# Patient Record
Sex: Male | Born: 1965 | Race: White | Hispanic: No | Smoking: Former smoker
Health system: Southern US, Community
[De-identification: ages and names within clinical notes are randomized; demographics above are authoritative.]

## PROBLEM LIST (undated history)

## (undated) DIAGNOSIS — I2510 Atherosclerotic heart disease of native coronary artery without angina pectoris: Secondary | ICD-10-CM

## (undated) DIAGNOSIS — G4733 Obstructive sleep apnea (adult) (pediatric): Secondary | ICD-10-CM

## (undated) DIAGNOSIS — E119 Type 2 diabetes mellitus without complications: Secondary | ICD-10-CM

## (undated) DIAGNOSIS — I10 Essential (primary) hypertension: Secondary | ICD-10-CM

## (undated) HISTORY — DX: Atherosclerotic heart disease of native coronary artery without angina pectoris: I25.10

---

## 2017-05-06 ENCOUNTER — Inpatient Hospital Stay (HOSPITAL_COMMUNITY)
Admission: AD | Admit: 2017-05-06 | Discharge: 2017-05-08 | DRG: 287 | Disposition: A | Discharge status: Home / Self Care | Payer: Self-pay | Source: Other Acute Inpatient Hospital | Attending: Internal Medicine | Admitting: Internal Medicine

## 2017-05-06 ENCOUNTER — Other Ambulatory Visit: Payer: Self-pay

## 2017-05-06 DIAGNOSIS — I200 Unstable angina: Secondary | ICD-10-CM

## 2017-05-06 DIAGNOSIS — E785 Hyperlipidemia, unspecified: Secondary | ICD-10-CM | POA: Diagnosis present

## 2017-05-06 DIAGNOSIS — Z6839 Body mass index (BMI) 39.0-39.9, adult: Secondary | ICD-10-CM

## 2017-05-06 DIAGNOSIS — Z880 Allergy status to penicillin: Secondary | ICD-10-CM

## 2017-05-06 DIAGNOSIS — Z8249 Family history of ischemic heart disease and other diseases of the circulatory system: Secondary | ICD-10-CM

## 2017-05-06 DIAGNOSIS — G4733 Obstructive sleep apnea (adult) (pediatric): Secondary | ICD-10-CM | POA: Diagnosis present

## 2017-05-06 DIAGNOSIS — E119 Type 2 diabetes mellitus without complications: Secondary | ICD-10-CM | POA: Diagnosis present

## 2017-05-06 DIAGNOSIS — Z7984 Long term (current) use of oral hypoglycemic drugs: Secondary | ICD-10-CM

## 2017-05-06 DIAGNOSIS — I25110 Atherosclerotic heart disease of native coronary artery with unstable angina pectoris: Secondary | ICD-10-CM | POA: Diagnosis present

## 2017-05-06 DIAGNOSIS — I10 Essential (primary) hypertension: Principal | ICD-10-CM | POA: Diagnosis present

## 2017-05-06 DIAGNOSIS — I249 Acute ischemic heart disease, unspecified: Secondary | ICD-10-CM | POA: Diagnosis present

## 2017-05-06 HISTORY — DX: Essential (primary) hypertension: I10

## 2017-05-06 HISTORY — DX: Type 2 diabetes mellitus without complications: E11.9

## 2017-05-06 HISTORY — DX: Morbid (severe) obesity due to excess calories: E66.01

## 2017-05-06 HISTORY — DX: Obstructive sleep apnea (adult) (pediatric): G47.33

## 2017-05-06 LAB — CBC WITH DIFFERENTIAL/PLATELET
Basophils Absolute: 0 10*3/uL (ref 0.0–0.1)
Basophils Relative: 0 %
EOS PCT: 1 %
Eosinophils Absolute: 0.1 10*3/uL (ref 0.0–0.7)
HEMATOCRIT: 43.1 % (ref 39.0–52.0)
Hemoglobin: 14.2 g/dL (ref 13.0–17.0)
LYMPHS PCT: 36 %
Lymphs Abs: 2.8 10*3/uL (ref 0.7–4.0)
MCH: 30.3 pg (ref 26.0–34.0)
MCHC: 32.9 g/dL (ref 30.0–36.0)
MCV: 92.1 fL (ref 78.0–100.0)
Monocytes Absolute: 0.4 10*3/uL (ref 0.1–1.0)
Monocytes Relative: 6 %
Neutro Abs: 4.4 10*3/uL (ref 1.7–7.7)
Neutrophils Relative %: 57 %
PLATELETS: 203 10*3/uL (ref 150–400)
RBC: 4.68 MIL/uL (ref 4.22–5.81)
RDW: 13.1 % (ref 11.5–15.5)
WBC: 7.8 10*3/uL (ref 4.0–10.5)

## 2017-05-06 LAB — COMPREHENSIVE METABOLIC PANEL
ALT: 49 U/L (ref 17–63)
AST: 28 U/L (ref 15–41)
Albumin: 3.8 g/dL (ref 3.5–5.0)
Alkaline Phosphatase: 79 U/L (ref 38–126)
Anion gap: 10 (ref 5–15)
BUN: 12 mg/dL (ref 6–20)
CHLORIDE: 101 mmol/L (ref 101–111)
CO2: 24 mmol/L (ref 22–32)
Calcium: 9.1 mg/dL (ref 8.9–10.3)
Creatinine, Ser: 0.71 mg/dL (ref 0.61–1.24)
GFR calc Af Amer: >60 mL/min (ref >60)
GLUCOSE: 229 mg/dL — AB (ref 65–99)
POTASSIUM: 3.9 mmol/L (ref 3.5–5.1)
Sodium: 135 mmol/L (ref 135–145)
Total Bilirubin: 0.8 mg/dL (ref 0.3–1.2)
Total Protein: 7.2 g/dL (ref 6.5–8.1)

## 2017-05-06 LAB — TROPONIN I

## 2017-05-06 LAB — TSH: TSH: 1.778 u[IU]/mL (ref 0.350–4.500)

## 2017-05-06 LAB — HEMOGLOBIN A1C
Hgb A1c MFr Bld: 10 % — ABNORMAL HIGH (ref 4.8–5.6)
MEAN PLASMA GLUCOSE: 240.3 mg/dL

## 2017-05-06 LAB — GLUCOSE, CAPILLARY
GLUCOSE-CAPILLARY: 315 mg/dL — AB (ref 65–99)
Glucose-Capillary: 289 mg/dL — ABNORMAL HIGH (ref 65–99)

## 2017-05-06 MED ORDER — NITROGLYCERIN 0.4 MG SL SUBL: 0.4000 mg | SUBLINGUAL_TABLET | SUBLINGUAL | Status: DC | PRN

## 2017-05-06 MED ORDER — ATENOLOL 12.5 MG HALF TABLET
12.5000 mg | ORAL_TABLET | Freq: Every day | ORAL | Status: DC
  Administered 2017-05-07 – 2017-05-08 (×3): 12.5 mg via ORAL
  Filled 2017-05-06 (×3): qty 1

## 2017-05-06 MED ORDER — ONDANSETRON HCL 4 MG/2ML IJ SOLN: 4.0000 mg | Freq: Four times a day (QID) | INTRAMUSCULAR | Status: DC | PRN

## 2017-05-06 MED ORDER — ASPIRIN EC 81 MG PO TBEC
81.0000 mg | DELAYED_RELEASE_TABLET | Freq: Every day | ORAL | Status: DC
  Administered 2017-05-07 – 2017-05-08 (×2): 81 mg via ORAL
  Filled 2017-05-06 (×2): qty 1

## 2017-05-06 MED ORDER — ACETAMINOPHEN 325 MG PO TABS
650.0000 mg | ORAL_TABLET | ORAL | Status: DC | PRN
  Administered 2017-05-06 – 2017-05-08 (×2): 650 mg via ORAL
  Filled 2017-05-06 (×2): qty 2

## 2017-05-06 MED ORDER — HYDROCHLOROTHIAZIDE 25 MG PO TABS
25.0000 mg | ORAL_TABLET | Freq: Every day | ORAL | Status: DC
  Administered 2017-05-07 – 2017-05-08 (×3): 25 mg via ORAL
  Filled 2017-05-06 (×3): qty 1

## 2017-05-06 MED ORDER — SIMVASTATIN 40 MG PO TABS
40.0000 mg | ORAL_TABLET | Freq: Every day | ORAL | Status: DC
  Administered 2017-05-07 – 2017-05-08 (×3): 40 mg via ORAL
  Filled 2017-05-06 (×3): qty 1

## 2017-05-06 MED ORDER — HEPARIN SODIUM (PORCINE) 5000 UNIT/ML IJ SOLN
5000.0000 [IU] | Freq: Three times a day (TID) | INTRAMUSCULAR | Status: DC
  Administered 2017-05-06 – 2017-05-07 (×2): 5000 [IU] via SUBCUTANEOUS
  Filled 2017-05-06 (×2): qty 1

## 2017-05-06 MED ORDER — LISINOPRIL 5 MG PO TABS
5.0000 mg | ORAL_TABLET | Freq: Every day | ORAL | Status: DC
  Administered 2017-05-07 – 2017-05-08 (×3): 5 mg via ORAL
  Filled 2017-05-06 (×3): qty 1

## 2017-05-06 NOTE — Progress Notes (Signed)
New pt admitted from [facility], cardiologist paged, waiting for a response, pt is stable and alert times 4, ambulatory, aware of safety precautions, will continue to monitor  Lonia Farber, RN

## 2017-05-06 NOTE — Progress Notes (Signed)
Called to cardiologist office to inform the patient's arrival to the unit, waiting for them to assess the patient for further orders.  Lonia Farber, RN

## 2017-05-06 NOTE — H&P (Signed)
History & Physical    Patient ID: Tommy Liu MRN: [id number], DOB/AGE: 1965-07-26   Admit date: 05/06/2017   Primary Physician: No primary care provider on file. Primary Cardiologist: None  Patient Profile    52 year old man with unstable angina  Past Medical History   Hypertension Non-insulin-dependent diabetes Obesity Sleep apnea   History of Present Illness    52 year old man with a past medical history of hypertension, diabetes, obesity, sleep apnea, who is a veteran treated in the interim [place].  Presents with a 3-day history of chest pain at rest.  Pain is exacerbated with activity and improved with resting.  He denies presyncope syncope.  He does endorse some lower extremity edema and shortness of breath over the preceding months.  He lives a sedentary lifestyle.  He admits to drinking alcohol to 3 times a week.  No smoking or illicit drug use  Stress test that was negative years ago.  Never had a left not right heart catheterization.  Does not know all his medications or his medication doses since he did not bring his medications with him.  Presented to [facility] emergency room where his ECG showed normal sinus rhythm without ischemic changes.  Troponins x2 was negative he was sent over here for elective left heart catheterization  Home Medications    Prior to Admission medications   Medication Sig Start Date End Date Taking? Authorizing Provider  atenolol (TENORMIN) 25 MG tablet Take by mouth daily.   Yes [provider]  glipiZIDE (GLUCOTROL) 10 MG tablet Take 10 mg by mouth daily before breakfast.   Yes [provider]  hydrochlorothiazide (HYDRODIURIL) 25 MG tablet Take by mouth daily.   Yes [provider]  lisinopril (PRINIVIL,ZESTRIL) 10 MG tablet Take by mouth daily.   Yes [provider]  metFORMIN (GLUCOPHAGE) 500 MG tablet Take 500 mg by mouth 2 (two) times daily with a meal.   Yes [provider]  simvastatin  (ZOCOR) 10 MG tablet Take by mouth daily.   Yes [provider]    Family History    Mother and father had both an MI in their 44s, no sudden cardiac death  Social History    Social History   Socioeconomic History  . Marital status: Divorced    Spouse name: Not on file  . Number of children: Not on file  . Years of education: Not on file  . Highest education level: Not on file  Occupational History  . Not on file  Social Needs  . Financial resource strain: Not on file  . Food insecurity:    Worry: Not on file    Inability: Not on file  . Transportation needs:    Medical: Not on file    Non-medical: Not on file  Tobacco Use  . Smoking status: Not on file  Substance and Sexual Activity  . Alcohol use: Not on file  . Drug use: Not on file  . Sexual activity: Not on file  Lifestyle  . Physical activity:    Days per week: Not on file    Minutes per session: Not on file  . Stress: Not on file  Relationships  . Social connections:    Talks on phone: Not on file    Gets together: Not on file    Attends religious service: Not on file    Active member of club or organization: Not on file    Attends meetings of clubs or organizations: Not on file  Relationship status: Not on file  . Intimate partner violence:    Fear of current or ex partner: Not on file    Emotionally abused: Not on file    Physically abused: Not on file    Forced sexual activity: Not on file  Other Topics Concern  . Not on file  Social History Narrative  . Not on file     Review of Systems    General:  No chills, fever, night sweats or weight changes.  Cardiovascular:  No chest pain, dyspnea on exertion, edema, orthopnea, palpitations, paroxysmal nocturnal dyspnea. Dermatological: No rash, lesions/masses Respiratory: No cough, dyspnea Urologic: No hematuria, dysuria Abdominal:   No nausea, vomiting, diarrhea, bright red blood per rectum, melena, or hematemesis Neurologic:  No visual  changes, wkns, changes in mental status. All other systems reviewed and are otherwise negative except as noted above.  Physical Exam    Blood pressure (!) 161/107, pulse 76, temperature 97.9 F (36.6 C), temperature source Oral, resp. rate 20, SpO2 97 %.  General: Pleasant, NAD Psych: Normal affect. Neuro: Alert and oriented X 3. Moves all extremities spontaneously. HEENT: Normal  Neck: Supple without bruits or JVD. Lungs:  Resp regular and unlabored, CTA. Heart: RRR no s3, s4, or murmurs. Abdomen: Soft, non-tender, non-distended, BS + x 4.  Extremities: No clubbing, cyanosis or edema. DP/PT/Radials 2+ and equal bilaterally.  Labs    Troponin (Point of Care Test) No results for input(s): TROPIPOC in the last 72 hours. No results for input(s): CKTOTAL, CKMB, TROPONINI in the last 72 hours. No results found for: WBC, HGB, HCT, MCV, PLT No results for input(s): NA, K, CL, CO2, BUN, CREATININE, CALCIUM, PROT, BILITOT, ALKPHOS, ALT, AST, GLUCOSE in the last 168 hours.  Invalid input(s): LABALBU No results found for: CHOL, HDL, LDLCALC, TRIG No results found for: [facility]   Radiology Studies    No results found.  ECG & Cardiac Imaging    Sinus rhythm with ischemic changes  Assessment & Plan    Unstable angina: Patient is currently asymptomatic 3 doses of nitroglycerin.  His angina symptoms are typical, and she does have some high risk features will be more likely that he has coronary disease.  Hypertension, obesity, family history and diabetes  Plan: - Admit to cardiology - CBC, CMP, trop, HBA1c.  - LHC in am - NPO after MN - TTE in am  - Will hold of Heparin for now - Started him on low doses of his home meds since he didn't know the doses he takes. [facility] exactly   Signed, Macario Golds, MD 05/06/2017, 7:11 PM

## 2017-05-07 ENCOUNTER — Encounter (HOSPITAL_COMMUNITY): Payer: Self-pay

## 2017-05-07 ENCOUNTER — Encounter (HOSPITAL_COMMUNITY): Admission: AD | Disposition: A | Discharge status: Home / Self Care | Payer: Self-pay | Attending: Internal Medicine

## 2017-05-07 ENCOUNTER — Inpatient Hospital Stay (HOSPITAL_COMMUNITY): Payer: Self-pay

## 2017-05-07 DIAGNOSIS — E119 Type 2 diabetes mellitus without complications: Secondary | ICD-10-CM

## 2017-05-07 DIAGNOSIS — I249 Acute ischemic heart disease, unspecified: Secondary | ICD-10-CM

## 2017-05-07 DIAGNOSIS — R079 Chest pain, unspecified: Secondary | ICD-10-CM

## 2017-05-07 DIAGNOSIS — I25119 Atherosclerotic heart disease of native coronary artery with unspecified angina pectoris: Secondary | ICD-10-CM

## 2017-05-07 HISTORY — PX: LEFT HEART CATH AND CORONARY ANGIOGRAPHY: CATH118249

## 2017-05-07 HISTORY — PX: ULTRASOUND GUIDANCE FOR VASCULAR ACCESS: SHX6516

## 2017-05-07 LAB — ECHOCARDIOGRAM COMPLETE
HEIGHTINCHES: 74 in
WEIGHTICAEL: 4880 [oz_av]

## 2017-05-07 LAB — GLUCOSE, CAPILLARY
GLUCOSE-CAPILLARY: 360 mg/dL — AB (ref 65–99)
GLUCOSE-CAPILLARY: 311 mg/dL — AB (ref 65–99)
Glucose-Capillary: 212 mg/dL — ABNORMAL HIGH (ref 65–99)
Glucose-Capillary: 300 mg/dL — ABNORMAL HIGH (ref 65–99)

## 2017-05-07 LAB — CREATININE, SERUM
CREATININE: 0.74 mg/dL (ref 0.61–1.24)
GFR calc Af Amer: >60 mL/min (ref >60)
GFR calc non Af Amer: >60 mL/min (ref >60)

## 2017-05-07 LAB — CBC
HCT: 43 % (ref 39.0–52.0)
Hemoglobin: 14.2 g/dL (ref 13.0–17.0)
MCH: 30.9 pg (ref 26.0–34.0)
MCHC: 33 g/dL (ref 30.0–36.0)
MCV: 93.5 fL (ref 78.0–100.0)
Platelets: 194 10*3/uL (ref 150–400)
RBC: 4.6 MIL/uL (ref 4.22–5.81)
RDW: 13.1 % (ref 11.5–15.5)
WBC: 7.8 10*3/uL (ref 4.0–10.5)

## 2017-05-07 LAB — POCT ACTIVATED CLOTTING TIME: ACTIVATED CLOTTING TIME: 131 s

## 2017-05-07 LAB — HEMOGLOBIN A1C
Hgb A1c MFr Bld: 9.9 % — ABNORMAL HIGH (ref 4.8–5.6)
MEAN PLASMA GLUCOSE: 237.43 mg/dL

## 2017-05-07 LAB — PROTIME-INR
INR: 0.94
Prothrombin Time: 12.5 seconds (ref 11.4–15.2)

## 2017-05-07 LAB — HIV ANTIBODY (ROUTINE TESTING W REFLEX): HIV Screen 4th Generation wRfx: NONREACTIVE

## 2017-05-07 LAB — TROPONIN I

## 2017-05-07 SURGERY — LEFT HEART CATH AND CORONARY ANGIOGRAPHY
Anesthesia: LOCAL

## 2017-05-07 MED ORDER — FENTANYL CITRATE (PF) 100 MCG/2ML IJ SOLN
INTRAMUSCULAR | Status: AC
  Filled 2017-05-07: qty 2

## 2017-05-07 MED ORDER — VERAPAMIL HCL 2.5 MG/ML IV SOLN
INTRAVENOUS | Status: AC
  Filled 2017-05-07: qty 2

## 2017-05-07 MED ORDER — LIDOCAINE HCL (PF) 1 % IJ SOLN
INTRAMUSCULAR | Status: AC
  Filled 2017-05-07: qty 30

## 2017-05-07 MED ORDER — SODIUM CHLORIDE 0.9 % IV SOLN: 250.0000 mL | INTRAVENOUS | Status: DC | PRN

## 2017-05-07 MED ORDER — IOHEXOL 350 MG/ML SOLN
INTRAVENOUS | Status: DC | PRN
  Administered 2017-05-07: 120 mL via INTRAVENOUS

## 2017-05-07 MED ORDER — MIDAZOLAM HCL 2 MG/2ML IJ SOLN
INTRAMUSCULAR | Status: DC | PRN
  Administered 2017-05-07: 2 mg via INTRAVENOUS

## 2017-05-07 MED ORDER — PERFLUTREN LIPID MICROSPHERE
1.0000 mL | INTRAVENOUS | Status: AC | PRN
  Administered 2017-05-07: 2 mL via INTRAVENOUS
  Filled 2017-05-07: qty 10

## 2017-05-07 MED ORDER — HEPARIN (PORCINE) IN NACL 1000-0.9 UT/500ML-% IV SOLN
INTRAVENOUS | Status: AC
  Filled 2017-05-07: qty 1000

## 2017-05-07 MED ORDER — LIDOCAINE HCL (PF) 1 % IJ SOLN
INTRAMUSCULAR | Status: DC | PRN
  Administered 2017-05-07: 10 mL
  Administered 2017-05-07: 2 mL

## 2017-05-07 MED ORDER — SODIUM CHLORIDE 0.9% FLUSH
3.0000 mL | Freq: Two times a day (BID) | INTRAVENOUS | Status: DC
  Administered 2017-05-08: 3 mL via INTRAVENOUS

## 2017-05-07 MED ORDER — MIDAZOLAM HCL 2 MG/2ML IJ SOLN
INTRAMUSCULAR | Status: AC
  Filled 2017-05-07: qty 2

## 2017-05-07 MED ORDER — SODIUM CHLORIDE 0.9% FLUSH: 3.0000 mL | INTRAVENOUS | Status: DC | PRN

## 2017-05-07 MED ORDER — SODIUM CHLORIDE 0.9 % IV SOLN
INTRAVENOUS | Status: AC
  Administered 2017-05-07: 16:00:00 via INTRAVENOUS

## 2017-05-07 MED ORDER — VERAPAMIL HCL 2.5 MG/ML IV SOLN
INTRAVENOUS | Status: DC | PRN
  Administered 2017-05-07: 10 mL via INTRA_ARTERIAL

## 2017-05-07 MED ORDER — HEPARIN (PORCINE) IN NACL 2-0.9 UNITS/ML
INTRAMUSCULAR | Status: AC | PRN
  Administered 2017-05-07 (×2): 500 mL

## 2017-05-07 MED ORDER — HEPARIN SODIUM (PORCINE) 1000 UNIT/ML IJ SOLN
INTRAMUSCULAR | Status: DC | PRN
  Administered 2017-05-07: 6000 [IU] via INTRAVENOUS

## 2017-05-07 MED ORDER — HEPARIN SODIUM (PORCINE) 5000 UNIT/ML IJ SOLN
5000.0000 [IU] | Freq: Three times a day (TID) | INTRAMUSCULAR | Status: DC
  Administered 2017-05-08: 5000 [IU] via SUBCUTANEOUS
  Filled 2017-05-07: qty 1

## 2017-05-07 MED ORDER — INSULIN ASPART 100 UNIT/ML ~~LOC~~ SOLN
0.0000 [IU] | Freq: Three times a day (TID) | SUBCUTANEOUS | Status: DC
  Administered 2017-05-07: 11 [IU] via SUBCUTANEOUS
  Administered 2017-05-07: 5 [IU] via SUBCUTANEOUS
  Administered 2017-05-08: 8 [IU] via SUBCUTANEOUS
  Administered 2017-05-08: 11 [IU] via SUBCUTANEOUS

## 2017-05-07 MED ORDER — SODIUM CHLORIDE 0.9 % IV SOLN
INTRAVENOUS | Status: AC | PRN
  Administered 2017-05-07: 50 mL/h via INTRAVENOUS

## 2017-05-07 MED ORDER — FENTANYL CITRATE (PF) 100 MCG/2ML IJ SOLN
INTRAMUSCULAR | Status: DC | PRN
  Administered 2017-05-07 (×2): 25 ug via INTRAVENOUS

## 2017-05-07 MED ORDER — HEPARIN SODIUM (PORCINE) 1000 UNIT/ML IJ SOLN
INTRAMUSCULAR | Status: AC
  Filled 2017-05-07: qty 1

## 2017-05-07 SURGICAL SUPPLY — 24 items
BAND ZEPHYR COMPRESS 30 LONG (HEMOSTASIS) ×3 IMPLANT
CATH INFINITI 5 FR JL3.5 (CATHETERS) ×3 IMPLANT
CATH INFINITI 5 FR MPA2 (CATHETERS) ×3 IMPLANT
CATH INFINITI 5FR ANG PIGTAIL (CATHETERS) ×3 IMPLANT
CATH INFINITI 5FR JL4 (CATHETERS) ×3 IMPLANT
CATH INFINITI JR4 5F (CATHETERS) ×3 IMPLANT
CATH LAUNCHER 5F MRADIAL (CATHETERS) ×2 IMPLANT
CATH LAUNCHER 5F RADL (CATHETERS) ×2 IMPLANT
CATH LAUNCHER 5F RADR (CATHETERS) ×2 IMPLANT
CATHETER LAUNCHER 5F MRADIAL (CATHETERS) ×3
CATHETER LAUNCHER 5F RADL (CATHETERS) ×3
CATHETER LAUNCHER 5F RADR (CATHETERS) ×3
COVER PRB 48X5XTLSCP FOLD TPE (BAG) ×2 IMPLANT
COVER PROBE 5X48 (BAG) ×1
GLIDESHEATH SLEND SS 6F .021 (SHEATH) ×3 IMPLANT
GUIDEWIRE INQWIRE 1.5J.035X260 (WIRE) ×2 IMPLANT
INQWIRE 1.5J .035X260CM (WIRE) ×3
KIT HEART LEFT (KITS) ×3 IMPLANT
PACK CARDIAC CATHETERIZATION (CUSTOM PROCEDURE TRAY) ×3 IMPLANT
SHEATH PINNACLE 5F 10CM (SHEATH) ×3 IMPLANT
SYR MEDRAD MARK V 150ML (SYRINGE) ×3 IMPLANT
TRANSDUCER W/STOPCOCK (MISCELLANEOUS) ×3 IMPLANT
TUBING CIL FLEX 10 FLL-RA (TUBING) ×3 IMPLANT
WIRE EMERALD 3MM-J .035X150CM (WIRE) ×3 IMPLANT

## 2017-05-07 NOTE — Progress Notes (Signed)
Site area: Right groin, 5 FR Site prior: Level 0 Manual Pressure applied for: 20 min Patient Status During pull: Calm Post Pull site: Level 0 Post pull instructions given Post pull pulses present: Palpable Dressing applied Bedrest begins at: 15:05 Comments: No complications during procedure

## 2017-05-07 NOTE — Progress Notes (Signed)
  Echocardiogram 2D Echocardiogram has been performed.  Tommy Liu 05/07/2017, 11:38 AM

## 2017-05-07 NOTE — H&P (View-Only) (Signed)
 Progress Note  Patient Name: Tommy Liu Date of Encounter: 05/07/2017  Primary Cardiologist: new, Kiely Cousar   Subjective   52-year-old gentleman with a history of diabetes mellitus who presented to the emergency room with symptoms consistent with unstable angina.  He was transferred to [facility] for heart catheterization.  He is currently pain-free after receiving nitroglycerin.  Inpatient Medications    Scheduled Meds: . aspirin EC  81 mg Oral Daily  . atenolol  12.5 mg Oral Daily  . heparin  5,000 Units Subcutaneous Q8H  . hydrochlorothiazide  25 mg Oral Daily  . insulin aspart  0-15 Units Subcutaneous TID WC  . lisinopril  5 mg Oral Daily  . simvastatin  40 mg Oral Daily   Continuous Infusions:  PRN Meds: acetaminophen, nitroGLYCERIN, ondansetron (ZOFRAN) IV   Vital Signs    Vitals:   05/06/17 1943 05/06/17 2300 05/07/17 0634 05/07/17 0924  BP: (!) 144/89 132/88 137/88 133/76  Pulse: 78 66 60 64  Resp: 16  18   Temp: 98.4 F (36.9 C)  97.8 F (36.6 C)   TempSrc: Oral  Oral   SpO2: 97% 98% 98% 95%  Weight:   (!) 305 lb (138.3 kg)     Intake/Output Summary (Last 24 hours) at 05/07/2017 1017 Last data filed at 05/06/2017 2300 Gross per 24 hour  Intake 240 ml  Output 300 ml  Net -60 ml   Filed Weights   05/07/17 0634  Weight: (!) 305 lb (138.3 kg)    Telemetry    NSR , sinus brady  - Personally Reviewed  ECG     NSR, no ST or T wave changes- Personally Reviewed  Physical Exam   GEN: , Morbid obese, no acute distress Neck: No JVD Cardiac: RRR, no murmurs, rubs, or gallops.  Respiratory: Clear to auscultation bilaterally. GI: Soft, nontender, non-distended  MS: No edema; No deformity.  Radial and femoral pulses are excellent. Neuro:  Nonfocal  Psych: Normal affect   Labs    Chemistry Recent Labs  Lab 05/06/17 1942  NA 135  K 3.9  CL 101  CO2 24  GLUCOSE 229*  BUN 12  CREATININE 0.71  CALCIUM 9.1  PROT 7.2  ALBUMIN 3.8    AST 28  ALT 49  ALKPHOS 79  BILITOT 0.8  GFRNONAA >60  GFRAA >60  ANIONGAP 10     Hematology Recent Labs  Lab 05/06/17 1942  WBC 7.8  RBC 4.68  HGB 14.2  HCT 43.1  MCV 92.1  MCH 30.3  MCHC 32.9  RDW 13.1  PLT 203    Cardiac Enzymes Recent Labs  Lab 05/06/17 1942 05/07/17 0606  TROPONINI <0.03 <0.03   No results for input(s): TROPIPOC in the last 168 hours.   BNPNo results for input(s): BNP, PROBNP in the last 168 hours.   DDimer No results for input(s): DDIMER in the last 168 hours.   Radiology    No results found.  Cardiac Studies     Patient Profile     52 y.o. male admitted with symptoms of typical angina.  He has a history of obstructive sleep apnea, morbid obesity, diabetes mellitus.  Assessment & Plan    1.  Unstable angina: Patient symptoms are consistent with unstable angina.  He is currently pain-free after receiving nitroglycerin. He is scheduled for heart catheterization today.  We discussed the risks, benefits, options.  He understands and agrees to proceed.  2.  Diabetes mellitus: His diabetes is very   poorly controlled.  His glucose this morning was 360.  His prescription A1c is 9.9. Diabetic coordinator evaluated. Primary medical doctor.  For questions or updates, please contact [facility] Please consult [email or web address] for contact info under Cardiology/STEMI.      Signed, Kristeen Miss, MD  05/07/2017, 10:17 AM

## 2017-05-07 NOTE — Progress Notes (Signed)
Removed patient's TR band. Patient tolerated well. Site is still a level 0.

## 2017-05-07 NOTE — Research (Signed)
CADFEM Informed Consent   Subject Name: Tommy Liu  Subject met inclusion and exclusion criteria.  The informed consent form, study requirements and expectations were reviewed with the subject and questions and concerns were addressed prior to the signing of the consent form.  The subject verbalized understanding of the trail requirements.  The subject agreed to participate in the CADFEM trial and signed the informed consent.  The informed consent was obtained prior to performance of any protocol-specific procedures for the subject.  A copy of the signed informed consent was given to the subject and a copy was placed in the subject's medical record.  [name] 05/07/2017, 8:35 AM

## 2017-05-07 NOTE — Interval H&P Note (Signed)
History and Physical Interval Note:  05/07/2017 12:42 PM  Tommy Liu  has presented today for surgery, with the diagnosis of cp  The various methods of treatment have been discussed with the patient and family. After consideration of risks, benefits and other options for treatment, the patient has consented to  Procedure(s): LEFT HEART CATH AND CORONARY ANGIOGRAPHY (N/A) as a surgical intervention .  The patient's history has been reviewed, patient examined, no change in status, stable for surgery.  I have reviewed the patient's chart and labs.  Questions were answered to the patient's satisfaction.   Cath Lab Visit (complete for each Cath Lab visit)  Clinical Evaluation Leading to the Procedure:   ACS: Yes.    Non-ACS:    Anginal Classification: CCS III  Anti-ischemic medical therapy: Maximal Therapy (2 or more classes of medications)  Non-Invasive Test Results: No non-invasive testing performed  Prior CABG: No previous CABG        Theron Arista [facility] 05/07/2017 12:42 PM

## 2017-05-07 NOTE — Progress Notes (Signed)
Progress Note  Patient Name: Tommy Liu Date of Encounter: 05/07/2017  Primary Cardiologist: new, [place] Espina   Subjective   52 year old gentleman with a history of diabetes mellitus who presented to the emergency room with symptoms consistent with unstable angina.  He was transferred to [facility] for heart catheterization.  He is currently pain-free after receiving nitroglycerin.  Inpatient Medications    Scheduled Meds: . aspirin EC  81 mg Oral Daily  . atenolol  12.5 mg Oral Daily  . heparin  5,000 Units Subcutaneous Q8H  . hydrochlorothiazide  25 mg Oral Daily  . insulin aspart  0-15 Units Subcutaneous TID WC  . lisinopril  5 mg Oral Daily  . simvastatin  40 mg Oral Daily   Continuous Infusions:  PRN Meds: acetaminophen, nitroGLYCERIN, ondansetron (ZOFRAN) IV   Vital Signs    Vitals:   05/06/17 1943 05/06/17 2300 05/07/17 0634 05/07/17 0924  BP: (!) 144/89 132/88 137/88 133/76  Pulse: 78 66 60 64  Resp: 16  18   Temp: 98.4 F (36.9 C)  97.8 F (36.6 C)   TempSrc: Oral  Oral   SpO2: 97% 98% 98% 95%  Weight:   (!) 305 lb (138.3 kg)     Intake/Output Summary (Last 24 hours) at 05/07/2017 1017 Last data filed at 05/06/2017 2300 Gross per 24 hour  Intake 240 ml  Output 300 ml  Net -60 ml   Filed Weights   05/07/17 0634  Weight: (!) 305 lb (138.3 kg)    Telemetry    NSR , sinus brady  - Personally Reviewed  ECG     NSR, no ST or T wave changes- Personally Reviewed  Physical Exam   GEN: , Morbid obese, no acute distress Neck: No JVD Cardiac: RRR, no murmurs, rubs, or gallops.  Respiratory: Clear to auscultation bilaterally. GI: Soft, nontender, non-distended  MS: No edema; No deformity.  Radial and femoral pulses are excellent. Neuro:  Nonfocal  Psych: Normal affect   Labs    Chemistry Recent Labs  Lab 05/06/17 1942  NA 135  K 3.9  CL 101  CO2 24  GLUCOSE 229*  BUN 12  CREATININE 0.71  CALCIUM 9.1  PROT 7.2  ALBUMIN 3.8    AST 28  ALT 49  ALKPHOS 79  BILITOT 0.8  GFRNONAA >60  GFRAA >60  ANIONGAP 10     Hematology Recent Labs  Lab 05/06/17 1942  WBC 7.8  RBC 4.68  HGB 14.2  HCT 43.1  MCV 92.1  MCH 30.3  MCHC 32.9  RDW 13.1  PLT 203    Cardiac Enzymes Recent Labs  Lab 05/06/17 1942 05/07/17 0606  TROPONINI <0.03 <0.03   No results for input(s): TROPIPOC in the last 168 hours.   BNPNo results for input(s): BNP, PROBNP in the last 168 hours.   DDimer No results for input(s): DDIMER in the last 168 hours.   Radiology    No results found.  Cardiac Studies     Patient Profile     52 y.o. male admitted with symptoms of typical angina.  He has a history of obstructive sleep apnea, morbid obesity, diabetes mellitus.  Assessment & Plan    1.  Unstable angina: Patient symptoms are consistent with unstable angina.  He is currently pain-free after receiving nitroglycerin. He is scheduled for heart catheterization today.  We discussed the risks, benefits, options.  He understands and agrees to proceed.  2.  Diabetes mellitus: His diabetes is very  poorly controlled.  His glucose this morning was 360.  His prescription A1c is 9.9. Diabetic coordinator evaluated. Primary medical doctor.  For questions or updates, please contact [facility] Please consult [email or web address] for contact info under Cardiology/STEMI.      Signed, Kristeen Miss, MD  05/07/2017, 10:17 AM

## 2017-05-07 NOTE — Progress Notes (Signed)
Inpatient Diabetes Program Recommendations  AACE/ADA: New Consensus Statement on Inpatient Glycemic Control (2015)  Target Ranges:  Prepandial:   less than 140 mg/dL      Peak postprandial:   less than 180 mg/dL (1-2 hours)      Critically ill patients:  140 - 180 mg/dL   Lab Results  Component Value Date   GLUCAP 360 (H) 05/07/2017   HGBA1C 9.9 (H) 05/07/2017    Review of Glycemic ControlResults for ANTERRIO, MCCLEERY (MRN [id number]) as of 05/07/2017 11:03  Ref. Range 05/06/2017 17:53 05/06/2017 21:17 05/07/2017 07:45  Glucose-Capillary Latest Ref Range: 65 - 99 mg/dL 409 (H) 811 (H) 914 (H)    Diabetes history: Type 2 DM  Outpatient Diabetes medications: Glucotrol 10 mg daily, Metformin 500 mg bid Current orders for Inpatient glycemic control:  Novolog moderate tid with meals   Inpatient Diabetes Program Recommendations:   Note elevated CBG's and A1C (average blood sugars 2-3 months prior to admit= 237 mg/dL).  Please consider adding Lantus 20 units daily while in the hospital.  Likely will need medication adjustments prior to d/c from hospital.  Will talk to patient.   Thanks,  Beryl Meager, RN, BC-ADM Inpatient Diabetes Coordinator Pager [phone] (8a-5p)

## 2017-05-08 ENCOUNTER — Telehealth: Payer: Self-pay | Admitting: Cardiovascular Disease

## 2017-05-08 ENCOUNTER — Encounter (HOSPITAL_COMMUNITY): Payer: Self-pay | Admitting: Cardiology

## 2017-05-08 DIAGNOSIS — G4733 Obstructive sleep apnea (adult) (pediatric): Secondary | ICD-10-CM

## 2017-05-08 DIAGNOSIS — I25118 Atherosclerotic heart disease of native coronary artery with other forms of angina pectoris: Secondary | ICD-10-CM

## 2017-05-08 DIAGNOSIS — E119 Type 2 diabetes mellitus without complications: Secondary | ICD-10-CM

## 2017-05-08 DIAGNOSIS — E782 Mixed hyperlipidemia: Secondary | ICD-10-CM

## 2017-05-08 LAB — GLUCOSE, CAPILLARY
Glucose-Capillary: 225 mg/dL — ABNORMAL HIGH (ref 65–99)
Glucose-Capillary: 282 mg/dL — ABNORMAL HIGH (ref 65–99)
Glucose-Capillary: 291 mg/dL — ABNORMAL HIGH (ref 65–99)

## 2017-05-08 LAB — TROPONIN I: Troponin I: <0.03 ng/mL (ref <0.03)

## 2017-05-08 MED ORDER — LISINOPRIL 5 MG PO TABS: 5.0000 mg | ORAL_TABLET | Freq: Every day | ORAL | 2 refills | Status: DC

## 2017-05-08 MED ORDER — ASPIRIN 81 MG PO TBEC: 81.0000 mg | DELAYED_RELEASE_TABLET | Freq: Every day | ORAL | Status: DC

## 2017-05-08 MED ORDER — NITROGLYCERIN 0.4 MG SL SUBL: 0.4000 mg | SUBLINGUAL_TABLET | SUBLINGUAL | 0 refills | Status: AC | PRN

## 2017-05-08 MED ORDER — SIMVASTATIN 40 MG PO TABS: 40.0000 mg | ORAL_TABLET | Freq: Every day | ORAL | 3 refills | Status: DC

## 2017-05-08 MED ORDER — ATENOLOL 25 MG PO TABS: 12.5000 mg | ORAL_TABLET | Freq: Every day | ORAL | 2 refills | Status: DC

## 2017-05-08 MED ORDER — ISOSORBIDE MONONITRATE ER 30 MG PO TB24: 30.0000 mg | ORAL_TABLET | Freq: Every day | ORAL | 2 refills | Status: DC

## 2017-05-08 MED ORDER — ISOSORBIDE MONONITRATE ER 30 MG PO TB24
30.0000 mg | ORAL_TABLET | Freq: Every day | ORAL | Status: DC
  Administered 2017-05-08: 30 mg via ORAL

## 2017-05-08 NOTE — Discharge Summary (Addendum)
Discharge Summary    Patient ID: Huber Mathers,  MRN: [id number], DOB/AGE: February 03, 1965 52 y.o.  Admit date: 05/06/2017 Discharge date: 05/08/2017  [facility]  Primary Cardiologist: Kristeen Miss, MD  Discharge Diagnoses    Principal Problem:   ACS (acute coronary syndrome) [facility]) Active Problems:   DM (diabetes mellitus), type 2 (HCC)   Morbid obesity (HCC)   Obstructive sleep apnea  Allergies Allergies  Allergen Reactions  . Penicillins Anaphylaxis and Other (See Comments)    Has patient had a PCN reaction causing immediate rash, facial/tongue/throat swelling, SOB or lightheadedness with hypotension: Yes Has patient had a PCN reaction causing severe rash involving mucus membranes or skin necrosis: No Has patient had a PCN reaction that required hospitalization: Unknown Has patient had a PCN reaction occurring within the last 10 years: No If all of the above answers are "NO", then may proceed with Cephalosporin use.    Diagnostic Studies/Procedures    Cardiac catheterization 05/07/17:  Left dominant circulation  Prox RCA lesion is 90% stenosed.  Prox LAD lesion is 40% stenosed.  The left ventricular systolic function is normal.  LV end diastolic pressure is normal.  The left ventricular ejection fraction is 55-65% by visual estimate.   1. Single vessel obstructive CAD involving a small nondominant RCA\ 2. Normal LV function 3. Normal LVEDP  Plan; medical management. I would avoid use of right radial approach in the future.  Echocardiogram 05/07/17:  Study Conclusions  - Left ventricle: The cavity size was normal. Wall thickness was   increased in a pattern of mild LVH. Systolic function was normal.   The estimated ejection fraction was in the range of 60% to 65%.   Wall motion was normal; there were no regional wall motion   abnormalities. Left ventricular diastolic function parameters   were normal. - Left atrium: The atrium  was mildly dilated.   History of Present Illness     Mr. Caporale is a 52 year old gentleman with a history of diabetes mellitus II, hypertension, obstructive sleep apnea and morbid obesity who presented to the [facility] emergency room on 05/06/17 with symptoms consistent with a 3-day hx of unstable angina at rest. He states that the pain was exacerbated with activity and improved with rest. He denied presyncope or syncopal episodes. He stated that he did have minimal LE swelling and SOB over the preceding months. He reports living a sedentary lifestyle. He admitted to drinking alcohol 3 times per week however no illicit drug use or smoking. He was ultimately transferred to [facility] for further evaluation with a heart catheterization.  In the ED, he was chest pain free. His EKG showed NSR without acute ischemic changes. His troponin levels were negative x2. He was sent from [facility] for elective cath given his presenting symptoms.   Hospital Course     On 05/07/17 the pt was taken to the cardiac catheterization lab for a LHC. His cath revealed mild to moderate coronary artery disease in the left anterior descending artery and left circumflex artery. The right coronary artery was small and nondominant and had a tight 80% proximal stenosis. His estimated LVEF was 55-65% which is consistent with the findings on subsequent echocardiogram performed on 05/07/17.   His plan is for medical management with recommendations to avoid right radial approach in the future due to difficulty advancing catheter during procedure. Due to this, the right femoral site was used.   Right radial and right femoral  cath sites are unremarkable without complication.  His troponin levels remained negative at <0.03, <0.03, <0.03. No change in his pre/post EKG.   The patient had an elevated HbA1c during this hospital admission at 9.9. The DM coordinator was consulted for assistance. [facility] [place] and was encouraged to follow with this PCP for further assistance and montioring. He was encouraged to ambulate more, drink more water and to check his blood sugars at least once during the day. His will be discharge on glipizide and metformin with recommendations to possible add a third agent (SGLT-2).   He has been stable during his hospital stay and will be set up with follow up in our office for close follow up. He ambulated prior to discharge without chest pain or complication.    The patient has been seen and examined by Dr. Elease Hashimoto who feels that he is stable and ready for discharge today, 05/08/17.  Medication plan: Increase Zocor to , decrease atenolol to 12.5mg , added Imdur , resume lisinopril, ASA 81, resume DM medications with close follow up with [facility] P[facility]  Consultants: None  _____________  Discharge Vitals Blood pressure (!) 167/99, pulse 72, temperature 98.3 F (36.8 C), temperature source Oral, resp. rate 20, height  (1.88 m), weight (!) 303 lb 14.4 oz (137.8 kg), SpO2 96 %.  Filed Weights   05/07/17 0634 05/08/17 0511  Weight: (!) 305 lb (138.3 kg) (!) 303 lb 14.4 oz (137.8 kg)   Labs & Radiologic Studies    CBC Recent Labs    05/06/17 1942 05/07/17 0915  WBC 7.8 7.8  NEUTROABS 4.4  --   HGB 14.2 14.2  HCT 43.1 43.0  MCV 92.1 93.5  PLT 203 194   Basic Metabolic Panel Recent Labs    16/10/96 1942 05/07/17 0915  NA 135  --   K 3.9  --   CL 101  --   CO2 24  --   GLUCOSE 229*  --   BUN 12  --   CREATININE 0.71 0.74  CALCIUM 9.1  --    Liver Function Tests Recent Labs    05/06/17 1942  AST 28  ALT 49  ALKPHOS 79  BILITOT 0.8  PROT 7.2  ALBUMIN 3.8   No results for input(s): LIPASE, AMYLASE in the last 72 hours. Cardiac Enzymes Recent Labs    05/06/17 1942 05/07/17 0606 05/08/17 0159  TROPONINI <0.03 <0.03 <0.03   Hemoglobin A1C Recent Labs    05/07/17 0915  HGBA1C 9.9*    Thyroid Function Tests Recent  Labs    05/06/17 1942  TSH 1.778   Disposition   Pt is being discharged home today in good condition.  Follow-up Plans & Appointments    Follow-up Information    Theodosia Paling, MD .   Specialty:  Ophthalmology Contact information: [phone] [facility] [address] [id number] [place] [phone]        Manson Passey, [place] Follow up on 05/18/2017.   Specialty:  Cardiology Why:  Your follow up appointment will be on 05/18/17 at 0900. Please arrive by 0845am. Thank you Contact information: [address] [place] [address] [phone]          Discharge Instructions    Call MD for:  persistant nausea and vomiting   Complete by:  As directed    Call MD for:  redness, tenderness, or signs of infection (pain, swelling, redness,  odor or green/yellow discharge around incision site)   Complete by:  As directed    Call MD for:  severe uncontrolled pain   Complete by:  As directed    Call MD for:  temperature >100.4   Complete by:  As directed    Diet - low sodium heart healthy   Complete by:  As directed    Discharge instructions   Complete by:  As directed    No driving for 3 days. No lifting over 5 lbs for 1 week. No sexual activity for 1 week. You may return to work on 05/14/17. Keep procedure site clean & dry. If you notice increased pain, swelling, bleeding or pus, call/return!  You may shower, but no soaking baths/hot tubs/pools for 1 week.   Increase activity slowly   Complete by:  As directed       Discharge Medications   Allergies as of 05/08/2017      Reactions   Penicillins Anaphylaxis, Other (See Comments)   Has patient had a PCN reaction causing immediate rash, facial/tongue/throat swelling, SOB or lightheadedness with hypotension: Yes Has patient had a PCN reaction causing severe rash involving mucus membranes or skin necrosis: No Has patient had a PCN reaction that required hospitalization:  Unknown Has patient had a PCN reaction occurring within the last 10 years: No If all of the above answers are "NO", then may proceed with Cephalosporin use.      Medication List    TAKE these medications   aspirin 81 MG EC tablet Take 1 tablet (81 mg total) by mouth daily. Start taking on:  05/09/2017   atenolol 25 MG tablet Commonly known as:  TENORMIN Take 0.5 tablets (12.5 mg total) by mouth daily. Start taking on:  05/09/2017 What changed:  how much to take   glipiZIDE 10 MG tablet Commonly known as:  GLUCOTROL Take 10 mg by mouth 2 (two) times daily before a meal.   hydrochlorothiazide 25 MG tablet Commonly known as:  HYDRODIURIL Take 25 mg by mouth daily.   isosorbide mononitrate 30 MG 24 hr tablet Commonly known as:  IMDUR Take 1 tablet (30 mg total) by mouth daily.   lisinopril 5 MG tablet Commonly known as:  PRINIVIL,ZESTRIL Take 1 tablet (5 mg total) by mouth daily. Start taking on:  05/09/2017 What changed:    medication strength  how much to take   metFORMIN 500 MG tablet Commonly known as:  GLUCOPHAGE Take 1,000 mg by mouth 2 (two) times daily with a meal.   nitroGLYCERIN 0.4 MG SL tablet Commonly known as:  NITROSTAT Place 1 tablet (0.4 mg total) under the tongue every 5 (five) minutes x 3 doses as needed for chest pain.   simvastatin 40 MG tablet Commonly known as:  ZOCOR Take 1 tablet (40 mg total) by mouth daily. Start taking on:  05/09/2017 What changed:    medication strength  how much to take  when to take this        Outstanding Labs/Studies   Will need to follow with [facility] P[facility] for DM control.  He will need LFT's given the increase in Zocor from  to    Duration of Discharge Encounter   Greater than 30 minutes including physician time.  Signed, Daron Offer, NP 05/08/2017, 10:21 AM  Attending Note:    The patient was seen and examined.  Agree with assessment and plan as noted above.  Changes made to the above  note as needed.  Patient seen and independently examined with Georgie Chard, NP.   We discussed all aspects of the encounter. I agree with the assessment and plan as stated above.  1.  Coronary artery disease: Patient has a stenosis and a small nondominant right coronary artery.  The vessels too small for PCI.  Medical therapy is indicated. Continue simvastatin 40 mg a day. Continue aspirin 81 mg a day, atenolol 12.5 mg a day Add Imdur  30 mg a day  His cath site is stable.   His job involves running machine and lifting boxes.  He should not lift anything heavier than 8 pounds for the next week.  We will write him a work note.  He will need a note excusing him from work.  He may return to work on Monday, May 13   2.  Hyperlipidemia: Managed by the [facility] in [place], [place] Continue simvastatin.  3.  Diabetes mellitus: His diabetes is very poorly controlled.  His hemoglobin A1c was 9.9.  I advised him to work on diet, exercise, weight loss.  He will follow-up with his doctors in [place].     I have spent a total of 40 minutes with patient reviewing hospital  notes , telemetry, EKGs, labs and examining patient as well as establishing an assessment and plan that was discussed with the patient. > 50% of time was spent in direct patient care.    Vesta Mixer, Montez Hageman., MD, [facility] 05/09/2017, 10:28 AM [address],  [address] Office [phone] Pager [phone]

## 2017-05-08 NOTE — Telephone Encounter (Signed)
New Message   TOC appt made on 5/17 at 9:00 am with Chelsea Aus per Noreene Larsson, NP

## 2017-05-08 NOTE — Progress Notes (Signed)
Progress Note  Patient Name: Tommy Liu Date of Encounter: 05/08/2017  Primary Cardiologist:  New, Tommy Liu   Subjective    52 year old gentleman with a history of diabetes mellitus who presented to the emergency room with symptoms consistent with unstable angina.  He was transferred to [facility] for heart catheterization.  He is currently pain-free after receiving nitroglycerin  Cardiac cath yesterday revealed mild to moderate coronary artery disease in the left anterior descending artery and left circumflex artery.  The right coronary artery was small and nondominant and had a tight 80% proximal stenosis.  Troponin levels were negative.  Inpatient Medications    Scheduled Meds: . aspirin EC  81 mg Oral Daily  . atenolol  12.5 mg Oral Daily  . heparin  5,000 Units Subcutaneous Q8H  . hydrochlorothiazide  25 mg Oral Daily  . insulin aspart  0-15 Units Subcutaneous TID WC  . lisinopril  5 mg Oral Daily  . simvastatin  40 mg Oral Daily  . sodium chloride flush  3 mL Intravenous Q12H   Continuous Infusions: . sodium chloride     PRN Meds: sodium chloride, acetaminophen, nitroGLYCERIN, ondansetron (ZOFRAN) IV, sodium chloride flush   Vital Signs    Vitals:   05/07/17 1701 05/07/17 1736 05/07/17 1928 05/08/17 0511  BP: 100/62 134/73 113/72 134/84  Pulse: 63 61 66 63  Resp:   18 20  Temp:   99 F (37.2 C) 98.3 F (36.8 C)  TempSrc:   Oral Oral  SpO2:   97% 96%  Weight:    (!) 303 lb 14.4 oz (137.8 kg)  Height:        Intake/Output Summary (Last 24 hours) at 05/08/2017 0809 Last data filed at 05/08/2017 1610 Gross per 24 hour  Intake 155 ml  Output 1700 ml  Net -1545 ml   Filed Weights   05/07/17 0634 05/08/17 0511  Weight: (!) 305 lb (138.3 kg) (!) 303 lb 14.4 oz (137.8 kg)    Telemetry     NSR  Personally Reviewed  ECG     nsr  - Personally Reviewed  Physical Exam   GEN:  Middle-aged male, morbidly obese in no acute distress Neck: No  JVD Cardiac: RRR, no murmurs, rubs, or gallops.  Respiratory: Clear to auscultation bilaterally. GI: Soft, nontender, non-distended  MS: No edema; right radial cath site is well-healed.  Left femoral cath site has no hematoma but is nontender. Neuro:  Nonfocal  Psych: Normal affect   Labs    Chemistry Recent Labs  Lab 05/06/17 1942 05/07/17 0915  NA 135  --   K 3.9  --   CL 101  --   CO2 24  --   GLUCOSE 229*  --   BUN 12  --   CREATININE 0.71 0.74  CALCIUM 9.1  --   PROT 7.2  --   ALBUMIN 3.8  --   AST 28  --   ALT 49  --   ALKPHOS 79  --   BILITOT 0.8  --   GFRNONAA >60 >60  GFRAA >60 >60  ANIONGAP 10  --      Hematology Recent Labs  Lab 05/06/17 1942 05/07/17 0915  WBC 7.8 7.8  RBC 4.68 4.60  HGB 14.2 14.2  HCT 43.1 43.0  MCV 92.1 93.5  MCH 30.3 30.9  MCHC 32.9 33.0  RDW 13.1 13.1  PLT 203 194    Cardiac Enzymes Recent Labs  Lab 05/06/17 1942 05/07/17 0606  05/08/17 0159  TROPONINI <0.03 <0.03 <0.03   No results for input(s): TROPIPOC in the last 168 hours.   BNPNo results for input(s): BNP, PROBNP in the last 168 hours.   DDimer No results for input(s): DDIMER in the last 168 hours.   Radiology    No results found.  Cardiac Studies     Patient Profile     52 y.o. male transferred from [facility] with chest pain.  Assessment & Plan    1.  Coronary artery disease: Patient has a stenosis and a small nondominant right coronary artery.  The vessels too small for PCI.  Medical therapy is indicated. Continue simvastatin 40 mg a day. Continue aspirin 81 mg a day, atenolol 12.5 mg a day Add Imdur  30 mg a day  His cath site is stable.   His job involves running machine and lifting boxes.  He should not lift anything heavier than 8 pounds for the next week.  We will write him a work note.  He will need a note excusing him from work.  He may return to work on Monday, May 13   2.  Hyperlipidemia: Managed by the [facility] in  [place], [place] Continue simvastatin.  3.  Diabetes mellitus: His diabetes is very poorly controlled.  His hemoglobin A1c was 9.9.  I advised him to work on diet, exercise, weight loss.  He will follow-up with his doctors in [place].  For questions or updates, please contact [facility] Please consult [email or web address] for contact info under Cardiology/STEMI.      Signed, Tommy Miss, MD  05/08/2017, 8:09 AM

## 2017-05-08 NOTE — Plan of Care (Signed)
  Problem: Clinical Measurements: Goal: Will remain free from infection Outcome: Progressing   Problem: Clinical Measurements: Goal: Diagnostic test results will improve Outcome: Progressing   Problem: Activity: Goal: Risk for activity intolerance will decrease Outcome: Progressing   

## 2017-05-08 NOTE — Progress Notes (Signed)
Inpatient Diabetes Program Recommendations  AACE/ADA: New Consensus Statement on Inpatient Glycemic Control (2015)  Target Ranges:  Prepandial:   less than 140 mg/dL      Peak postprandial:   less than 180 mg/dL (1-2 hours)      Critically ill patients:  140 - 180 mg/dL   Lab Results  Component Value Date   GLUCAP 282 (H) 05/08/2017   HGBA1C 9.9 (H) 05/07/2017    Review of Glycemic ControlResults for JAYCEION, LISENBY (MRN [id number]) as of 05/08/2017 09:36  Ref. Range 05/07/2017 14:38 05/07/2017 17:16 05/07/2017 21:22 05/08/2017 07:48  Glucose-Capillary Latest Ref Range: 65 - 99 mg/dL 409 (H) 811 (H) 914 (H) 282 (H)    Diabetes history: Type 2 DM  Outpatient Diabetes medications:  Glucotrol 10 mg bid, Metformin 1000 mg bid Current orders for Inpatient glycemic control:  Novolog moderate tid with meals  Inpatient Diabetes Program Recommendations:    Spoke with patient regarding elevated A1C.  He see's MD at [facility] in [place].  We discussed his A1C and goal A1C levels.  He states that he takes his medications as ordered.  Discussed importance of f/u with his PCP and possibly f/u with endocrinologist at [facility].  I am unsure of formulary at [facility] however patient may do well with SGLT-2 due to their effects on reducing cardiovascular events? We discussed lifestyle modifications such as diet and exercise.  Patient states he moves very little at work and has identified "walking" as a exercise that he can do.  Encouraged him to also drink water.  He rarely checks his blood sugars.  Encouraged at least daily blood sugar monitoring.  Patient seems to understand why it is important to control blood sugars.  He will however need close follow up and likely medication changes in order to get A1C to goal.   Thanks  Beryl Meager, RN, BC-ADM Inpatient Diabetes Coordinator Pager [phone] (8a-5p)

## 2017-05-08 NOTE — Progress Notes (Signed)
Pt has orders to be discharged. Discharge instructions given and pt has no additional questions at this time. Medication regimen reviewed and pt educated. Pt verbalized understanding and has no additional questions. Telemetry box removed. IV removed and site in good condition. Pt stable and waiting for transportation. 

## 2017-05-08 NOTE — Telephone Encounter (Signed)
Patient contacted regarding discharge from [facility] on 05/07/2017.  Patient understands to follow up with provider Chelsea Aus on 05/18/17 at 11:30 at [address] [facility] [address] in [place]. Patient understands discharge instructions? Yes Patient understands medications and regiment? Yes Patient understands to bring all medications to this visit? Yes

## 2017-05-09 MED FILL — Heparin Sod (Porcine)-NaCl IV Soln 1000 Unit/500ML-0.9%: INTRAVENOUS | Qty: 1000 | Status: AC

## 2017-05-11 ENCOUNTER — Telehealth: Payer: Self-pay | Admitting: Physician Assistant

## 2017-05-11 NOTE — Telephone Encounter (Signed)
New Message   Patient is calling requesting a letter to not be able to return to work until after his appointment with [employer]. He indicates that although he does not have chest pain he is still fatigue. Please call to discuss.

## 2017-05-11 NOTE — Telephone Encounter (Signed)
Will route to Dr. Elease Hashimoto to see if ok to write note for pt to be out until he sees Vin on 5/17.

## 2017-05-14 NOTE — Telephone Encounter (Signed)
Spoke with patient who states he is currently at work and does not get off until 7 pm. He states he is scheduled to work the rest of this week and will be allowed to be off on Friday for his appointment here with V. Bhagat, PA. I asked if he needs a work note and he states he has noticed that he is more SOB than prior to his cardiac cath. He did not have any stents placed. I advised that he will need to allow himself some time to adjust to recent medications that were started, particularly the Imdur and monitor his BP. I advised him he will also need to work on weight loss and heart healthy diet. I advised that typically no more than 1 week off work is given after a cardiac cath and advised if he has additional concerns prior to his appointment on Friday to call back. He verbalized understanding and agreement with plan and thanked me for the call.

## 2017-05-14 NOTE — Telephone Encounter (Signed)
Left message for patient to call back  

## 2017-05-14 NOTE — Telephone Encounter (Signed)
Please excuse patient from work until after his appt o May 18, 2017

## 2017-05-18 ENCOUNTER — Encounter: Payer: Self-pay | Admitting: Physician Assistant

## 2017-05-18 ENCOUNTER — Encounter: Payer: Self-pay | Admitting: *Deleted

## 2017-05-18 ENCOUNTER — Ambulatory Visit (INDEPENDENT_AMBULATORY_CARE_PROVIDER_SITE_OTHER): Payer: Self-pay | Admitting: Physician Assistant

## 2017-05-18 VITALS — BP 116/78 | HR 68 | Ht 74.0 in | Wt 313.0 lb

## 2017-05-18 DIAGNOSIS — G4733 Obstructive sleep apnea (adult) (pediatric): Secondary | ICD-10-CM

## 2017-05-18 DIAGNOSIS — E119 Type 2 diabetes mellitus without complications: Secondary | ICD-10-CM

## 2017-05-18 DIAGNOSIS — E785 Hyperlipidemia, unspecified: Secondary | ICD-10-CM

## 2017-05-18 DIAGNOSIS — I2510 Atherosclerotic heart disease of native coronary artery without angina pectoris: Secondary | ICD-10-CM

## 2017-05-18 DIAGNOSIS — I10 Essential (primary) hypertension: Secondary | ICD-10-CM

## 2017-05-18 NOTE — Patient Instructions (Signed)
Your physician recommends that you continue on your current medications as directed. Please refer to the Current Medication list given to you today.   Your physician recommends that you schedule a follow-up appointment in:  2 MONTHS WITH DR NAHSER  

## 2017-05-18 NOTE — Progress Notes (Signed)
Cardiology Office Note    Date:  05/18/2017   ID:  Tommy, Liu March 08, 1965, MRN [id number]  PCP: [facility]   Cardiologist:  Dr. Elease Hashimoto  Chief Complaint: Hospital follow up   History of Present Illness:   Tommy Liu is a 52 y.o. male with a history of diabetes mellitus II, hypertension, obstructive sleep apnea and morbid obesity and recent dx of CAD presents for hospital follow wp.   Admitted 5/5-5/7 for unstable angina. Cath revealed 90% pRCA and 40% pLAD. The right coronary artery was small and nondominant. His estimated LVEF was 55-65% which is consistent with the findings on subsequent echocardiogram performed on 05/07/17. Plan medical management with recommendations to avoid right radial approach in the future due to difficulty advancing catheter during procedure. Due to this, the right femoral site was used. Recommended outpatient follow up with PCP for A1c of 9.  Here today for follow up.  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  He went to work Monday & Tuesday however noted frequent episodes of angina needing to rest and sublingual nitroglycerin with resolution eventually in 15 to 20 minutes.  He was off work Wednesday and Thursday.  Noted some leg weakness at work.  Limited ambulation without subsequent angina.  He has changed his diet.  Has establish care with PCP in [facility].  Trying to lose weight.  Denies orthopnea, PND, syncope or lower extremity edema.  Past Medical History:  Diagnosis Date  . CAD in native artery    a. Cath 05/2017 -  90% pRCA (small & nondominant) and 40% pLAD >> medical mangment   . Diabetes mellitus without complication (HCC)   . Hypertension   . Morbid obesity (HCC)   . Obstructive sleep apnea     Current Medications: Prior to Admission medications   Medication Sig Start Date End Date Taking? Authorizing Provider  aspirin EC 81 MG EC tablet Take 1 tablet (81 mg total) by mouth daily. 05/09/17    Georgie Chard D, NP  atenolol (TENORMIN) 25 MG tablet Take 0.5 tablets (12.5 mg total) by mouth daily. 05/09/17   Filbert Schilder, NP  glipiZIDE (GLUCOTROL) 10 MG tablet Take 10 mg by mouth 2 (two) times daily before a meal.     [provider]  hydrochlorothiazide (HYDRODIURIL) 25 MG tablet Take 25 mg by mouth daily.     [provider]  isosorbide mononitrate (IMDUR) 30 MG 24 hr tablet Take 1 tablet (30 mg total) by mouth daily. 05/08/17   Filbert Schilder, NP  lisinopril (PRINIVIL,ZESTRIL) 5 MG tablet Take 1 tablet (5 mg total) by mouth daily. 05/09/17   Filbert Schilder, NP  metFORMIN (GLUCOPHAGE) 500 MG tablet Take 1,000 mg by mouth 2 (two) times daily with a meal.     [provider]  nitroGLYCERIN (NITROSTAT) 0.4 MG SL tablet Place 1 tablet (0.4 mg total) under the tongue every 5 (five) minutes x 3 doses as needed for chest pain. 05/08/17   Georgie Chard D, NP  simvastatin (ZOCOR) 40 MG tablet Take 1 tablet (40 mg total) by mouth daily. 05/09/17   Filbert Schilder, NP    Allergies:   Penicillins   Social History   Socioeconomic History  . Marital status: Divorced    Spouse name: Not on file  . Number of children: Not on file  . Years of education: Not on file  . Highest education level: Not on file  Occupational  History  . Not on file  Social Needs  . Financial resource strain: Not on file  . Food insecurity:    Worry: Not on file    Inability: Not on file  . Transportation needs:    Medical: Not on file    Non-medical: Not on file  Tobacco Use  . Smoking status: Former Smoker    Packs/day: 1.00    Years: 30.00    Pack years: 30.00    Types: Cigarettes    Last attempt to quit: 01/02/1997    Years since quitting: 20.3  . Smokeless tobacco: Former Engineer, water and Sexual Activity  . Alcohol use: Yes    Alcohol/week: 1.2 oz    Types: 2 Cans of beer per week  . Drug use: Not on file  . Sexual activity: Not on file  Lifestyle  . Physical activity:     Days per week: Not on file    Minutes per session: Not on file  . Stress: Not on file  Relationships  . Social connections:    Talks on phone: Not on file    Gets together: Not on file    Attends religious service: Not on file    Active member of club or organization: Not on file    Attends meetings of clubs or organizations: Not on file    Relationship status: Not on file  Other Topics Concern  . Not on file  Social History Narrative  . Not on file     Family History:  The patient's family history includes Hypertension in his father.   ROS:   Please see the history of present illness.    ROS All other systems reviewed and are negative.   PHYSICAL EXAM:   VS:  BP 116/78   Pulse 68   Ht  (1.88 m)   Wt (!) 313 lb (142 kg)   SpO2 91%   BMI 40.19 kg/m    GEN: Well nourished, well developed, in no acute distress  HEENT: normal  Neck: no JVD, carotid bruits, or masses Cardiac: RRR; no murmurs, rubs, or gallops,no edema.  Cardiac cath site without hematoma Respiratory:  clear to auscultation bilaterally, normal work of breathing GI: soft, nontender, nondistended, + BS MS: no deformity or atrophy  Skin: warm and dry, no rash Neuro:  Alert and Oriented x 3, Strength and sensation are intact Psych: euthymic mood, full affect  Wt Readings from Last 3 Encounters:  05/18/17 (!) 313 lb (142 kg)  05/08/17 (!) 303 lb 14.4 oz (137.8 kg)      Studies/Labs Reviewed:   EKG:  EKG is not ordered today.  Recent Labs: 05/06/2017: ALT 49; BUN 12; Potassium 3.9; Sodium 135; TSH 1.778 05/07/2017: Creatinine, Ser 0.74; Hemoglobin 14.2; Platelets 194   Lipid Panel No results found for: CHOL, TRIG, HDL, CHOLHDL, VLDL, LDLCALC, LDLDIRECT  Additional studies/ records that were reviewed today include:   LEFT HEART CATH AND CORONARY ANGIOGRAPHY 05/07/17  Conclusion     Left dominant circulation  Prox RCA lesion is 90% stenosed.  Prox LAD lesion is 40% stenosed.  The left  ventricular systolic function is normal.  LV end diastolic pressure is normal.  The left ventricular ejection fraction is 55-65% by visual estimate.   1. Single vessel obstructive CAD involving a small nondominant RCA\ 2. Normal LV function 3. Normal LVEDP   Echo 05/07/17 Study Conclusions  - Left ventricle: The cavity size was normal. Wall thickness was   increased  in a pattern of mild LVH. Systolic function was normal.   The estimated ejection fraction was in the range of 60% to 65%.   Wall motion was normal; there were no regional wall motion   abnormalities. Left ventricular diastolic function parameters   were normal. - Left atrium: The atrium was mildly dilated.   ASSESSMENT & PLAN:    1. CAD -States that he had a recurrent angina requiring sublingual nitroglycerin and rest.  He works 12-hour shift 5 days a week.  He will take off from work for 2 more weeks and works on his diet and gradual exercise to gain his strength prior to returning to work.  Advised to take as needed sublingual issue.  If ongoing issue prior to returning work he will give [place] a call and will consider increasing Imdur 60 milligrams daily at that time.  If no improvement with increasing medical therapy consider discussion with Dr. Elease Hashimoto to see if we can do PCI of RCA.  - continue ASA, BB and statin.   2. DM -Working with PCP on diet and exercise regimen  3.  Hyperlipidemia -Continue statin  4.  Hypertension -Blood pressure stable on current medication.  No change today.  5.  Obstructive sleep apnea -[facility]   Medication Adjustments/Labs and Tests Ordered: Current medicines are reviewed at length with the patient today.  Concerns regarding medicines are outlined above.  Medication changes, Labs and Tests ordered today are listed in the Patient Instructions below. Patient Instructions  Your physician recommends that you continue on your current medications as  directed. Please refer to the Current Medication list given to you today.   Your physician recommends that you schedule a follow-up appointment in:  2 MONTHS WITH DR Moss Mc, [place]  05/18/2017 9:48 AM    [facility] [address] [place], [place], [place]  [address] Phone: [phone]; Fax: [phone]

## 2017-09-12 ENCOUNTER — Encounter: Payer: Self-pay | Admitting: Cardiovascular Disease

## 2017-09-12 ENCOUNTER — Ambulatory Visit (INDEPENDENT_AMBULATORY_CARE_PROVIDER_SITE_OTHER): Payer: Self-pay | Admitting: Cardiovascular Disease

## 2017-09-12 VITALS — BP 132/82 | HR 75 | Ht 74.0 in | Wt 308.0 lb

## 2017-09-12 DIAGNOSIS — I25118 Atherosclerotic heart disease of native coronary artery with other forms of angina pectoris: Secondary | ICD-10-CM

## 2017-09-12 DIAGNOSIS — I10 Essential (primary) hypertension: Secondary | ICD-10-CM

## 2017-09-12 DIAGNOSIS — I2510 Atherosclerotic heart disease of native coronary artery without angina pectoris: Secondary | ICD-10-CM

## 2017-09-12 LAB — BASIC METABOLIC PANEL
BUN/Creatinine Ratio: 15 (ref 9–20)
BUN: 11 mg/dL (ref 6–24)
CALCIUM: 9.8 mg/dL (ref 8.7–10.2)
CHLORIDE: 93 mmol/L — AB (ref 96–106)
CO2: 23 mmol/L (ref 20–29)
Creatinine, Ser: 0.75 mg/dL — ABNORMAL LOW (ref 0.76–1.27)
GFR calc Af Amer: 123 mL/min/{1.73_m2} (ref >59)
GFR, EST NON AFRICAN AMERICAN: 106 mL/min/{1.73_m2} (ref >59)
GLUCOSE: 444 mg/dL — AB (ref 65–99)
POTASSIUM: 4.6 mmol/L (ref 3.5–5.2)
Sodium: 133 mmol/L — ABNORMAL LOW (ref 134–144)

## 2017-09-12 LAB — LIPID PANEL
CHOL/HDL RATIO: 6.1 ratio — AB (ref 0.0–5.0)
Cholesterol, Total: 200 mg/dL — ABNORMAL HIGH (ref 100–199)
HDL: 33 mg/dL — ABNORMAL LOW (ref >39)
LDL CALC: 92 mg/dL (ref 0–99)
Triglycerides: 375 mg/dL — ABNORMAL HIGH (ref 0–149)
VLDL CHOLESTEROL CAL: 75 mg/dL — AB (ref 5–40)

## 2017-09-12 LAB — HEPATIC FUNCTION PANEL
ALBUMIN: 4.2 g/dL (ref 3.5–5.5)
ALT: 51 IU/L — ABNORMAL HIGH (ref 0–44)
AST: 32 IU/L (ref 0–40)
Alkaline Phosphatase: 108 IU/L (ref 39–117)
BILIRUBIN TOTAL: 0.5 mg/dL (ref 0.0–1.2)
Bilirubin, Direct: 0.17 mg/dL (ref 0.00–0.40)
TOTAL PROTEIN: 6.9 g/dL (ref 6.0–8.5)

## 2017-09-12 MED ORDER — ATENOLOL 25 MG PO TABS: 25.0000 mg | ORAL_TABLET | Freq: Every day | ORAL | 3 refills | Status: DC

## 2017-09-12 MED ORDER — ISOSORBIDE MONONITRATE ER 60 MG PO TB24: 60.0000 mg | ORAL_TABLET | Freq: Every day | ORAL | 3 refills | Status: DC

## 2017-09-12 NOTE — Patient Instructions (Addendum)
Medication Instructions:  Your physician has recommended you make the following change in your medication:   INCREASE Isosorbide mononitrate (Imdur) to 60 mg daily INCREASE Tenormin to 25 mg once daily   Labwork: TODAY - cholesterol, liver panel, basic metabolic panel   Testing/Procedures: None Ordered   Follow-Up: Your physician recommends that you schedule a follow-up appointment in: 3 months with Chelsea Aus, PA   If you need a refill on your cardiac medications before your next appointment, please call your pharmacy.   Thank you for choosing [facility]! Eligha Bridegroom, RN [phone]

## 2017-09-12 NOTE — Progress Notes (Signed)
Cardiology Office Note:    Date:  09/12/2017   ID:  Tommy Liu, DOB November 25, 1965, MRN [id number]  PCP:  [facility]  Cardiologist:  Kristeen Miss, MD  Electrophysiologist:  None   Referring MD: [facility]   Problem list  1.  Coronary artery disease- tight nondominant RCA 2.  Hypertension 3.  Diabetes mellitus type 2 4.  Obstructive sleep apnea 5.  Morbid obesity  Chief Complaint  Patient presents with  . Coronary Artery Disease     History of Present Illness:    Tommy Liu is a 52 y.o. male with a hx of  CAD .  Has a LAD stenosis of 50% and a 90% stenosis of the small, nondominant RCA  Has continued to have cp - especially with exertion .  Took a SL NTG .   Past Medical History:  Diagnosis Date  . CAD in native artery    a. Cath 05/2017 -  90% pRCA (small & nondominant) and 40% pLAD >> medical mangment   . Diabetes mellitus without complication (HCC)   . Hypertension   . Morbid obesity (HCC)   . Obstructive sleep apnea     Past Surgical History:  Procedure Laterality Date  . LEFT HEART CATH AND CORONARY ANGIOGRAPHY N/A 05/07/2017   Procedure: LEFT HEART CATH AND CORONARY ANGIOGRAPHY;  Surgeon: Swaziland, Peter M, MD;  Location: [facility] INVASIVE CV LAB;  Service: Cardiovascular;  Laterality: N/A;  . ULTRASOUND GUIDANCE FOR VASCULAR ACCESS  05/07/2017   Procedure: Ultrasound Guidance For Vascular Access;  Surgeon: Swaziland, Peter M, MD;  Location: [facility] INVASIVE CV LAB;  Service: Cardiovascular;;    Current Medications: Current Meds  Medication Sig  . aspirin EC 81 MG EC tablet Take 1 tablet (81 mg total) by mouth daily.  Marland Kitchen atenolol (TENORMIN) 25 MG tablet Take 0.5 tablets (12.5 mg total) by mouth daily.  Marland Kitchen glipiZIDE (GLUCOTROL) 10 MG tablet Take 10 mg by mouth 2 (two) times daily before a meal.   . hydrochlorothiazide (HYDRODIURIL) 25 MG tablet Take 25 mg by mouth daily.   . isosorbide mononitrate (IMDUR) 30 MG 24 hr tablet Take 1 tablet  (30 mg total) by mouth daily.  Marland Kitchen lisinopril (PRINIVIL,ZESTRIL) 5 MG tablet Take 1 tablet (5 mg total) by mouth daily.  . metFORMIN (GLUCOPHAGE) 500 MG tablet Take 1,000 mg by mouth 2 (two) times daily with a meal.   . nitroGLYCERIN (NITROSTAT) 0.4 MG SL tablet Place 1 tablet (0.4 mg total) under the tongue every 5 (five) minutes x 3 doses as needed for chest pain.  . simvastatin (ZOCOR) 40 MG tablet Take 1 tablet (40 mg total) by mouth daily.     Allergies:   Penicillins   Social History   Socioeconomic History  . Marital status: Divorced    Spouse name: Not on file  . Number of children: Not on file  . Years of education: Not on file  . Highest education level: Not on file  Occupational History  . Not on file  Social Needs  . Financial resource strain: Not on file  . Food insecurity:    Worry: Not on file    Inability: Not on file  . Transportation needs:    Medical: Not on file    Non-medical: Not on file  Tobacco Use  . Smoking status: Former Smoker    Packs/day: 1.00    Years: 30.00    Pack years: 30.00    Types: Cigarettes  Last attempt to quit: 01/02/1997    Years since quitting: 20.7  . Smokeless tobacco: Former Engineer, water and Sexual Activity  . Alcohol use: Yes    Alcohol/week: 2.0 standard drinks    Types: 2 Cans of beer per week  . Drug use: Not on file  . Sexual activity: Not on file  Lifestyle  . Physical activity:    Days per week: Not on file    Minutes per session: Not on file  . Stress: Not on file  Relationships  . Social connections:    Talks on phone: Not on file    Gets together: Not on file    Attends religious service: Not on file    Active member of club or organization: Not on file    Attends meetings of clubs or organizations: Not on file    Relationship status: Not on file  Other Topics Concern  . Not on file  Social History Narrative  . Not on file     Family History: The patient's family history includes Hypertension in  his father.  ROS:   Please see the history of present illness.     All other systems reviewed and are negative.  EKGs/Labs/Other Studies Reviewed:    The following studies were reviewed today:   EKG:     Recent Labs: 05/06/2017: ALT 49; BUN 12; Potassium 3.9; Sodium 135; TSH 1.778 05/07/2017: Creatinine, Ser 0.74; Hemoglobin 14.2; Platelets 194  Recent Lipid Panel No results found for: CHOL, TRIG, HDL, CHOLHDL, VLDL, LDLCALC, LDLDIRECT  Physical Exam:    VS:  BP 132/82   Pulse 75   Ht 6\' 2"  (1.88 m)   Wt (!) 308 lb (139.7 kg)   SpO2 95%   BMI 39.54 kg/m     Wt Readings from Last 3 Encounters:  09/12/17 (!) 308 lb (139.7 kg)  05/18/17 (!) 313 lb (142 kg)  05/08/17 (!) 303 lb 14.4 oz (137.8 kg)     GEN:  Well nourished, well developed in no acute distress HEENT: Normal NECK: No JVD; No carotid bruits LYMPHATICS: No lymphadenopathy CARDIAC: RRR, no murmurs, rubs, gallops RESPIRATORY:  Clear to auscultation without rales, wheezing or rhonchi  ABDOMEN: Soft, non-tender, non-distended MUSCULOSKELETAL:  No edema; No deformity  SKIN: Warm and dry NEUROLOGIC:  Alert and oriented x 3 PSYCHIATRIC:  Normal affect   ASSESSMENT:    No diagnosis found. PLAN:    In order of problems listed above:  1. 1.  Coronary artery disease: The patient has a 50% LAD stenosis and a 90% stenosis and a small nondominant right coronary artery.  He still having some episodes of angina.  We will increase the isosorbide to 60 mg a day.  Will increase the Tenormin to 25 mg a day.  We will have him see an APP in 3 months.  If he continues to have angina we should consider repeat heart catheterization with possible PTCA of the small nondominant right.  May also consider FFR of the LAD.     Medication Adjustments/Labs and Tests Ordered: Current medicines are reviewed at length with the patient today.  Concerns regarding medicines are outlined above.  No orders of the defined types were placed in  this encounter.  No orders of the defined types were placed in this encounter.   There are no Patient Instructions on file for this visit.   Signed, Kristeen Miss, MD  09/12/2017 10:01 AM    [facility]

## 2017-09-14 ENCOUNTER — Telehealth: Payer: Self-pay

## 2017-09-14 DIAGNOSIS — E785 Hyperlipidemia, unspecified: Secondary | ICD-10-CM

## 2017-09-14 DIAGNOSIS — E781 Pure hyperglyceridemia: Secondary | ICD-10-CM

## 2017-09-14 MED ORDER — ROSUVASTATIN CALCIUM 10 MG PO TABS: 10.0000 mg | ORAL_TABLET | Freq: Every day | ORAL | 3 refills | Status: DC

## 2017-09-14 MED ORDER — CHOLINE FENOFIBRATE 135 MG PO CPDR: 135.0000 mg | DELAYED_RELEASE_CAPSULE | Freq: Every day | ORAL | 3 refills | Status: DC

## 2017-09-14 NOTE — Telephone Encounter (Signed)
-----   Message from Vesta MixerPhilip J Nahser, MD sent at 09/13/2017  4:53 PM EDT ----- He needs to greatly improve his diet Stop simvastatin Start Crestor 10 mg a day  And Fenofibrate 135 mg a day  Glucose is markedly elevated.   He needs to discuss this with his primary MD  Recheck BMP , liver enz. And lipid profile in 3 months. Needs to lose weight

## 2017-09-14 NOTE — Telephone Encounter (Signed)
Notes recorded by Sigurd Sosapp, Nicklous Aburto, RN on 09/14/2017 at 11:14 AM EDT [id number] 9/13

## 2017-09-14 NOTE — Telephone Encounter (Signed)
Notes recorded by Sigurd Sosapp, Jaken Fregia, RN on 09/14/2017 at 12:29 PM EDT Reviewed lab results/recommendations with patient. I made medication changes and arranged lab appt. He verbalized understanding. ------

## 2017-09-14 NOTE — Telephone Encounter (Signed)
-----   Message from Philip J Nahser, MD sent at 09/13/2017  4:53 PM EDT ----- He needs to greatly improve his diet Stop simvastatin Start Crestor 10 mg a day  And Fenofibrate 135 mg a day  Glucose is markedly elevated.   He needs to discuss this with his primary MD  Recheck BMP , liver enz. And lipid profile in 3 months. Needs to lose weight    

## 2017-10-29 ENCOUNTER — Encounter: Payer: Self-pay | Admitting: Physician Assistant

## 2017-11-20 ENCOUNTER — Encounter: Payer: Self-pay | Admitting: Physician Assistant

## 2017-11-20 ENCOUNTER — Ambulatory Visit: Payer: Self-pay | Admitting: Physician Assistant

## 2017-11-20 DIAGNOSIS — R0989 Other specified symptoms and signs involving the circulatory and respiratory systems: Secondary | ICD-10-CM

## 2018-06-17 DIAGNOSIS — Z01818 Encounter for other preprocedural examination: Secondary | ICD-10-CM

## 2020-09-19 ENCOUNTER — Inpatient Hospital Stay: Payer: Self-pay

## 2020-09-19 ENCOUNTER — Inpatient Hospital Stay (HOSPITAL_COMMUNITY): Payer: Self-pay

## 2020-09-19 ENCOUNTER — Inpatient Hospital Stay (HOSPITAL_COMMUNITY)
Admission: EM | Admit: 2020-09-19 | Discharge: 2020-09-22 | DRG: 246 | Disposition: A | Discharge status: Home / Self Care | Payer: Self-pay | Attending: Cardiology | Admitting: Cardiology

## 2020-09-19 ENCOUNTER — Encounter (HOSPITAL_COMMUNITY)
Admission: EM | Disposition: A | Discharge status: Home / Self Care | Payer: Self-pay | Source: Home / Self Care | Attending: Cardiology

## 2020-09-19 ENCOUNTER — Emergency Department (HOSPITAL_COMMUNITY): Payer: Self-pay

## 2020-09-19 DIAGNOSIS — Z880 Allergy status to penicillin: Secondary | ICD-10-CM

## 2020-09-19 DIAGNOSIS — I213 ST elevation (STEMI) myocardial infarction of unspecified site: Secondary | ICD-10-CM

## 2020-09-19 DIAGNOSIS — E119 Type 2 diabetes mellitus without complications: Secondary | ICD-10-CM

## 2020-09-19 DIAGNOSIS — Z6828 Body mass index (BMI) 28.0-28.9, adult: Secondary | ICD-10-CM

## 2020-09-19 DIAGNOSIS — I110 Hypertensive heart disease with heart failure: Secondary | ICD-10-CM | POA: Diagnosis present

## 2020-09-19 DIAGNOSIS — I5041 Acute combined systolic (congestive) and diastolic (congestive) heart failure: Secondary | ICD-10-CM | POA: Diagnosis present

## 2020-09-19 DIAGNOSIS — I252 Old myocardial infarction: Secondary | ICD-10-CM

## 2020-09-19 DIAGNOSIS — I5021 Acute systolic (congestive) heart failure: Secondary | ICD-10-CM | POA: Diagnosis present

## 2020-09-19 DIAGNOSIS — I2102 ST elevation (STEMI) myocardial infarction involving left anterior descending coronary artery: Secondary | ICD-10-CM | POA: Diagnosis present

## 2020-09-19 DIAGNOSIS — Z79899 Other long term (current) drug therapy: Secondary | ICD-10-CM

## 2020-09-19 DIAGNOSIS — Z8249 Family history of ischemic heart disease and other diseases of the circulatory system: Secondary | ICD-10-CM

## 2020-09-19 DIAGNOSIS — E1169 Type 2 diabetes mellitus with other specified complication: Secondary | ICD-10-CM | POA: Diagnosis present

## 2020-09-19 DIAGNOSIS — I2109 ST elevation (STEMI) myocardial infarction involving other coronary artery of anterior wall: Principal | ICD-10-CM | POA: Diagnosis present

## 2020-09-19 DIAGNOSIS — Z87892 Personal history of anaphylaxis: Secondary | ICD-10-CM

## 2020-09-19 DIAGNOSIS — Z7982 Long term (current) use of aspirin: Secondary | ICD-10-CM

## 2020-09-19 DIAGNOSIS — Z7984 Long term (current) use of oral hypoglycemic drugs: Secondary | ICD-10-CM

## 2020-09-19 DIAGNOSIS — G4733 Obstructive sleep apnea (adult) (pediatric): Secondary | ICD-10-CM | POA: Diagnosis present

## 2020-09-19 DIAGNOSIS — I2510 Atherosclerotic heart disease of native coronary artery without angina pectoris: Secondary | ICD-10-CM | POA: Diagnosis present

## 2020-09-19 DIAGNOSIS — I25110 Atherosclerotic heart disease of native coronary artery with unstable angina pectoris: Secondary | ICD-10-CM | POA: Diagnosis present

## 2020-09-19 DIAGNOSIS — Z955 Presence of coronary angioplasty implant and graft: Secondary | ICD-10-CM

## 2020-09-19 DIAGNOSIS — R570 Cardiogenic shock: Secondary | ICD-10-CM | POA: Diagnosis present

## 2020-09-19 DIAGNOSIS — E785 Hyperlipidemia, unspecified: Secondary | ICD-10-CM | POA: Diagnosis present

## 2020-09-19 DIAGNOSIS — Z87891 Personal history of nicotine dependence: Secondary | ICD-10-CM

## 2020-09-19 DIAGNOSIS — Z20822 Contact with and (suspected) exposure to covid-19: Secondary | ICD-10-CM | POA: Diagnosis present

## 2020-09-19 DIAGNOSIS — I255 Ischemic cardiomyopathy: Secondary | ICD-10-CM | POA: Diagnosis present

## 2020-09-19 DIAGNOSIS — Z7902 Long term (current) use of antithrombotics/antiplatelets: Secondary | ICD-10-CM

## 2020-09-19 DIAGNOSIS — E1165 Type 2 diabetes mellitus with hyperglycemia: Secondary | ICD-10-CM | POA: Diagnosis present

## 2020-09-19 DIAGNOSIS — E1159 Type 2 diabetes mellitus with other circulatory complications: Secondary | ICD-10-CM

## 2020-09-19 HISTORY — PX: LEFT HEART CATH AND CORONARY ANGIOGRAPHY: CATH118249

## 2020-09-19 HISTORY — PX: CORONARY STENT INTERVENTION: CATH118234

## 2020-09-19 HISTORY — PX: CORONARY/GRAFT ACUTE MI REVASCULARIZATION: CATH118305

## 2020-09-19 HISTORY — PX: RIGHT HEART CATH: CATH118263

## 2020-09-19 LAB — HEMOGLOBIN A1C
Hgb A1c MFr Bld: 11.4 % — ABNORMAL HIGH (ref 4.8–5.6)
Mean Plasma Glucose: 280.48 mg/dL

## 2020-09-19 LAB — BASIC METABOLIC PANEL
Anion gap: 7 (ref 5–15)
BUN: 6 mg/dL (ref 6–20)
CO2: 23 mmol/L (ref 22–32)
Calcium: 7.2 mg/dL — ABNORMAL LOW (ref 8.9–10.3)
Chloride: 105 mmol/L (ref 98–111)
Creatinine, Ser: 0.37 mg/dL — ABNORMAL LOW (ref 0.61–1.24)
GFR, Estimated: >60 mL/min (ref >60)
Glucose, Bld: 317 mg/dL — ABNORMAL HIGH (ref 70–99)
Potassium: 2.7 mmol/L — CL (ref 3.5–5.1)
Sodium: 135 mmol/L (ref 135–145)

## 2020-09-19 LAB — COMPREHENSIVE METABOLIC PANEL
ALT: 17 U/L (ref 0–44)
AST: 11 U/L — ABNORMAL LOW (ref 15–41)
Albumin: 3 g/dL — ABNORMAL LOW (ref 3.5–5.0)
Alkaline Phosphatase: 93 U/L (ref 38–126)
Anion gap: 9 (ref 5–15)
BUN: 8 mg/dL (ref 6–20)
CO2: 28 mmol/L (ref 22–32)
Calcium: 9 mg/dL (ref 8.9–10.3)
Chloride: 97 mmol/L — ABNORMAL LOW (ref 98–111)
Creatinine, Ser: 0.7 mg/dL (ref 0.61–1.24)
GFR, Estimated: >60 mL/min (ref >60)
Glucose, Bld: 494 mg/dL — ABNORMAL HIGH (ref 70–99)
Potassium: 5.4 mmol/L — ABNORMAL HIGH (ref 3.5–5.1)
Sodium: 134 mmol/L — ABNORMAL LOW (ref 135–145)
Total Bilirubin: 0.6 mg/dL (ref 0.3–1.2)
Total Protein: 6.4 g/dL — ABNORMAL LOW (ref 6.5–8.1)

## 2020-09-19 LAB — CBC WITH DIFFERENTIAL/PLATELET
Abs Immature Granulocytes: 0.04 10*3/uL (ref 0.00–0.07)
Basophils Absolute: 0 10*3/uL (ref 0.0–0.1)
Basophils Relative: 0 %
Eosinophils Absolute: 0 10*3/uL (ref 0.0–0.5)
Eosinophils Relative: 1 %
HCT: 38.8 % — ABNORMAL LOW (ref 39.0–52.0)
Hemoglobin: 12.4 g/dL — ABNORMAL LOW (ref 13.0–17.0)
Immature Granulocytes: 1 %
Lymphocytes Relative: 33 %
Lymphs Abs: 2.3 10*3/uL (ref 0.7–4.0)
MCH: 30.2 pg (ref 26.0–34.0)
MCHC: 32 g/dL (ref 30.0–36.0)
MCV: 94.4 fL (ref 80.0–100.0)
Monocytes Absolute: 0.6 10*3/uL (ref 0.1–1.0)
Monocytes Relative: 8 %
Neutro Abs: 4 10*3/uL (ref 1.7–7.7)
Neutrophils Relative %: 57 %
Platelets: 279 10*3/uL (ref 150–400)
RBC: 4.11 MIL/uL — ABNORMAL LOW (ref 4.22–5.81)
RDW: 11.8 % (ref 11.5–15.5)
WBC: 7 10*3/uL (ref 4.0–10.5)
nRBC: 0 % (ref 0.0–0.2)

## 2020-09-19 LAB — LIPID PANEL
Cholesterol: 172 mg/dL (ref 0–200)
HDL: 34 mg/dL — ABNORMAL LOW (ref >40)
LDL Cholesterol: 109 mg/dL — ABNORMAL HIGH (ref 0–99)
Total CHOL/HDL Ratio: 5.1 RATIO
Triglycerides: 144 mg/dL (ref <150)
VLDL: 29 mg/dL (ref 0–40)

## 2020-09-19 LAB — GLUCOSE, CAPILLARY
Glucose-Capillary: 338 mg/dL — ABNORMAL HIGH (ref 70–99)
Glucose-Capillary: 332 mg/dL — ABNORMAL HIGH (ref 70–99)

## 2020-09-19 LAB — COOXEMETRY PANEL
Carboxyhemoglobin: 1 % (ref 0.5–1.5)
Methemoglobin: 0.8 % (ref 0.0–1.5)
O2 Saturation: 57.4 %
Total hemoglobin: 12.5 g/dL (ref 12.0–16.0)

## 2020-09-19 LAB — LACTIC ACID, PLASMA
Lactic Acid, Venous: 1.7 mmol/L (ref 0.5–1.9)
Lactic Acid, Venous: 0.5 mmol/L (ref 0.5–1.9)

## 2020-09-19 LAB — APTT: aPTT: 24 seconds (ref 24–36)

## 2020-09-19 LAB — TROPONIN I (HIGH SENSITIVITY)
Troponin I (High Sensitivity): 533 ng/L (ref <18)
Troponin I (High Sensitivity): 492 ng/L (ref <18)

## 2020-09-19 LAB — MAGNESIUM: Magnesium: 1.4 mg/dL — ABNORMAL LOW (ref 1.7–2.4)

## 2020-09-19 LAB — PROTIME-INR
INR: 1.1 (ref 0.8–1.2)
Prothrombin Time: 13.8 seconds (ref 11.4–15.2)

## 2020-09-19 LAB — RESP PANEL BY RT-PCR (FLU A&B, COVID) ARPGX2
Influenza A by PCR: NEGATIVE
Influenza B by PCR: NEGATIVE
SARS Coronavirus 2 by RT PCR: NEGATIVE

## 2020-09-19 LAB — MRSA NEXT GEN BY PCR, NASAL: MRSA by PCR Next Gen: NOT DETECTED

## 2020-09-19 SURGERY — CORONARY/GRAFT ACUTE MI REVASCULARIZATION
Anesthesia: LOCAL

## 2020-09-19 MED ORDER — ASPIRIN 81 MG PO CHEW
81.0000 mg | CHEWABLE_TABLET | Freq: Every day | ORAL | Status: DC
  Administered 2020-09-20 – 2020-09-22 (×3): 81 mg via ORAL
  Filled 2020-09-19 (×3): qty 1

## 2020-09-19 MED ORDER — TICAGRELOR 90 MG PO TABS
ORAL_TABLET | ORAL | Status: AC
  Filled 2020-09-19: qty 2

## 2020-09-19 MED ORDER — NOREPINEPHRINE 4 MG/250ML-% IV SOLN
0.0000 ug/min | INTRAVENOUS | Status: DC
Start: 2020-09-19 — End: 2020-09-19
  Administered 2020-09-19: 5 ug/min via INTRAVENOUS

## 2020-09-19 MED ORDER — HEPARIN SODIUM (PORCINE) 5000 UNIT/ML IJ SOLN
5000.0000 [IU] | Freq: Three times a day (TID) | INTRAMUSCULAR | Status: DC
  Administered 2020-09-20 – 2020-09-22 (×7): 5000 [IU] via SUBCUTANEOUS
  Filled 2020-09-19 (×8): qty 1

## 2020-09-19 MED ORDER — NOREPINEPHRINE 4 MG/250ML-% IV SOLN
0.0000 ug/min | INTRAVENOUS | Status: DC
Start: 2020-09-19 — End: 2020-09-19

## 2020-09-19 MED ORDER — NOREPINEPHRINE 4 MG/250ML-% IV SOLN
INTRAVENOUS | Status: AC
  Administered 2020-09-19: 2 ug/min via INTRAVENOUS
  Filled 2020-09-19: qty 250

## 2020-09-19 MED ORDER — TIROFIBAN (AGGRASTAT) BOLUS VIA INFUSION
INTRAVENOUS | Status: DC | PRN
  Administered 2020-09-19: 2475 ug via INTRAVENOUS

## 2020-09-19 MED ORDER — TIROFIBAN HCL IN NACL 5-0.9 MG/100ML-% IV SOLN
INTRAVENOUS | Status: AC
  Filled 2020-09-19: qty 100

## 2020-09-19 MED ORDER — SODIUM CHLORIDE 0.9 % IV SOLN: 250.0000 mL | INTRAVENOUS | Status: DC | PRN

## 2020-09-19 MED ORDER — NITROGLYCERIN 1 MG/10 ML FOR IR/CATH LAB
INTRA_ARTERIAL | Status: DC | PRN
  Administered 2020-09-19: 100 ug via INTRACORONARY

## 2020-09-19 MED ORDER — LIDOCAINE HCL (PF) 1 % IJ SOLN
INTRAMUSCULAR | Status: DC | PRN
  Administered 2020-09-19: 15 mL

## 2020-09-19 MED ORDER — TIROFIBAN HCL IN NACL 5-0.9 MG/100ML-% IV SOLN
INTRAVENOUS | Status: AC | PRN
  Administered 2020-09-19: .15 ug/kg/min via INTRAVENOUS

## 2020-09-19 MED ORDER — ONDANSETRON HCL 4 MG/2ML IJ SOLN: 4.0000 mg | Freq: Four times a day (QID) | INTRAMUSCULAR | Status: DC | PRN

## 2020-09-19 MED ORDER — HEPARIN (PORCINE) IN NACL 1000-0.9 UT/500ML-% IV SOLN
INTRAVENOUS | Status: AC
  Filled 2020-09-19: qty 1000

## 2020-09-19 MED ORDER — DAPAGLIFLOZIN PROPANEDIOL 10 MG PO TABS
10.0000 mg | ORAL_TABLET | Freq: Every day | ORAL | Status: DC
  Filled 2020-09-19 (×2): qty 1

## 2020-09-19 MED ORDER — INSULIN ASPART 100 UNIT/ML IJ SOLN
0.0000 [IU] | Freq: Every day | INTRAMUSCULAR | Status: DC
  Administered 2020-09-19: 4 [IU] via SUBCUTANEOUS
  Filled 2020-09-19: qty 0.05

## 2020-09-19 MED ORDER — HEPARIN SODIUM (PORCINE) 5000 UNIT/ML IJ SOLN
INTRAMUSCULAR | Status: AC
  Administered 2020-09-19: 5000 [IU]
  Filled 2020-09-19: qty 1

## 2020-09-19 MED ORDER — SODIUM CHLORIDE 0.9% FLUSH
10.0000 mL | Freq: Two times a day (BID) | INTRAVENOUS | Status: DC
  Administered 2020-09-19 – 2020-09-22 (×5): 10 mL

## 2020-09-19 MED ORDER — HEPARIN SODIUM (PORCINE) 1000 UNIT/ML IJ SOLN
INTRAMUSCULAR | Status: DC | PRN
  Administered 2020-09-19: 9000 [IU] via INTRAVENOUS
  Administered 2020-09-19: 4000 [IU] via INTRAVENOUS

## 2020-09-19 MED ORDER — INSULIN ASPART 100 UNIT/ML IJ SOLN
0.0000 [IU] | Freq: Three times a day (TID) | INTRAMUSCULAR | Status: DC
  Administered 2020-09-19: 11 [IU] via SUBCUTANEOUS
  Administered 2020-09-20 (×2): 8 [IU] via SUBCUTANEOUS
  Administered 2020-09-20 – 2020-09-21 (×2): 11 [IU] via SUBCUTANEOUS
  Administered 2020-09-21: 15 [IU] via SUBCUTANEOUS
  Administered 2020-09-21: 3 [IU] via SUBCUTANEOUS
  Administered 2020-09-22: 5 [IU] via SUBCUTANEOUS
  Filled 2020-09-19: qty 0.15

## 2020-09-19 MED ORDER — TICAGRELOR 90 MG PO TABS
90.0000 mg | ORAL_TABLET | Freq: Two times a day (BID) | ORAL | Status: DC
  Administered 2020-09-19 – 2020-09-22 (×6): 90 mg via ORAL
  Filled 2020-09-19 (×6): qty 1

## 2020-09-19 MED ORDER — SODIUM CHLORIDE 0.9 % IV SOLN
INTRAVENOUS | Status: AC | PRN
  Administered 2020-09-19 (×2): 10 mL/h via INTRAVENOUS

## 2020-09-19 MED ORDER — TICAGRELOR 90 MG PO TABS
ORAL_TABLET | ORAL | Status: DC | PRN
  Administered 2020-09-19: 180 mg via ORAL

## 2020-09-19 MED ORDER — ATROPINE SULFATE 1 MG/10ML IJ SOSY
PREFILLED_SYRINGE | INTRAMUSCULAR | Status: AC
  Filled 2020-09-19: qty 10

## 2020-09-19 MED ORDER — SODIUM CHLORIDE 0.9 % IV SOLN: INTRAVENOUS | Status: DC

## 2020-09-19 MED ORDER — ACETAMINOPHEN 325 MG PO TABS: 650.0000 mg | ORAL_TABLET | ORAL | Status: DC | PRN

## 2020-09-19 MED ORDER — CHLORHEXIDINE GLUCONATE CLOTH 2 % EX PADS
6.0000 | MEDICATED_PAD | Freq: Every day | CUTANEOUS | Status: DC
  Administered 2020-09-19 – 2020-09-22 (×4): 6 via TOPICAL

## 2020-09-19 MED ORDER — ROSUVASTATIN CALCIUM 20 MG PO TABS
40.0000 mg | ORAL_TABLET | Freq: Every day | ORAL | Status: DC
  Administered 2020-09-20 – 2020-09-22 (×3): 40 mg via ORAL
  Filled 2020-09-19 (×4): qty 2

## 2020-09-19 MED ORDER — TIROFIBAN HCL IN NACL 5-0.9 MG/100ML-% IV SOLN
0.1500 ug/kg/min | INTRAVENOUS | Status: AC
  Administered 2020-09-19 (×3): 0.15 ug/kg/min via INTRAVENOUS
  Filled 2020-09-19 (×3): qty 100

## 2020-09-19 MED ORDER — HYDRALAZINE HCL 20 MG/ML IJ SOLN: 10.0000 mg | INTRAMUSCULAR | Status: AC | PRN

## 2020-09-19 MED ORDER — NITROGLYCERIN 1 MG/10 ML FOR IR/CATH LAB
INTRA_ARTERIAL | Status: AC
  Filled 2020-09-19: qty 10

## 2020-09-19 MED ORDER — POTASSIUM CHLORIDE CRYS ER 20 MEQ PO TBCR
40.0000 meq | EXTENDED_RELEASE_TABLET | ORAL | Status: AC
Start: 2020-09-19 — End: 2020-09-20
  Administered 2020-09-19 – 2020-09-20 (×2): 40 meq via ORAL
  Filled 2020-09-19 (×2): qty 2

## 2020-09-19 MED ORDER — IOHEXOL 350 MG/ML SOLN
INTRAVENOUS | Status: DC | PRN
  Administered 2020-09-19: 160 mL

## 2020-09-19 MED ORDER — NOREPINEPHRINE 4 MG/250ML-% IV SOLN: 0.0000 ug/min | INTRAVENOUS | Status: DC

## 2020-09-19 MED ORDER — FUROSEMIDE 10 MG/ML IJ SOLN
40.0000 mg | Freq: Once | INTRAMUSCULAR | Status: AC
  Administered 2020-09-19: 40 mg via INTRAVENOUS
  Filled 2020-09-19: qty 4

## 2020-09-19 MED ORDER — NOREPINEPHRINE 4 MG/250ML-% IV SOLN: 2.0000 ug/min | INTRAVENOUS | Status: DC

## 2020-09-19 MED ORDER — SODIUM CHLORIDE 0.9% FLUSH: 10.0000 mL | INTRAVENOUS | Status: DC | PRN

## 2020-09-19 MED ORDER — ORAL CARE MOUTH RINSE
15.0000 mL | Freq: Two times a day (BID) | OROMUCOSAL | Status: DC
  Administered 2020-09-22: 15 mL via OROMUCOSAL

## 2020-09-19 MED ORDER — SODIUM CHLORIDE 0.9 % IV SOLN: INTRAVENOUS | Status: AC

## 2020-09-19 MED ORDER — SODIUM CHLORIDE 0.9 % IV SOLN: 250.0000 mL | INTRAVENOUS | Status: DC

## 2020-09-19 MED ORDER — SODIUM CHLORIDE 0.9% FLUSH
3.0000 mL | Freq: Two times a day (BID) | INTRAVENOUS | Status: DC
  Administered 2020-09-20: 3 mL via INTRAVENOUS

## 2020-09-19 MED ORDER — HEPARIN SODIUM (PORCINE) 1000 UNIT/ML IJ SOLN
INTRAMUSCULAR | Status: AC
  Filled 2020-09-19: qty 1

## 2020-09-19 MED ORDER — SODIUM CHLORIDE 0.9% FLUSH: 3.0000 mL | INTRAVENOUS | Status: DC | PRN

## 2020-09-19 MED ORDER — LIDOCAINE HCL (PF) 1 % IJ SOLN
INTRAMUSCULAR | Status: AC
  Filled 2020-09-19: qty 30

## 2020-09-19 MED ORDER — HEPARIN SODIUM (PORCINE) 5000 UNIT/ML IJ SOLN
4000.0000 [IU] | Freq: Once | INTRAMUSCULAR | Status: AC
  Administered 2020-09-19: 4000 [IU] via INTRAVENOUS

## 2020-09-19 MED ORDER — NOREPINEPHRINE 4 MG/250ML-% IV SOLN
2.0000 ug/min | INTRAVENOUS | Status: DC
  Administered 2020-09-19: 2 ug/min via INTRAVENOUS
  Administered 2020-09-19: 5 ug/min via INTRAVENOUS
  Filled 2020-09-19: qty 250

## 2020-09-19 SURGICAL SUPPLY — 25 items
BALLN SAPPHIRE 2.5X12 (BALLOONS) ×2
BALLOON SAPPHIRE 2.5X12 (BALLOONS) ×1 IMPLANT
CATH INFINITI 5FR MULTPACK ANG (CATHETERS) ×2 IMPLANT
CATH SWAN GANZ VIP 7.5F (CATHETERS) ×2 IMPLANT
CATH VISTA GUIDE 6FR XBLAD3.5 (CATHETERS) ×4 IMPLANT
CATH VISTA GUIDE 6FR XBLAD4 (CATHETERS) ×2 IMPLANT
ELECT DEFIB PAD ADLT CADENCE (PAD) ×2 IMPLANT
GUIDEWIRE INQWIRE 1.5J.035X260 (WIRE) ×1 IMPLANT
INQWIRE 1.5J .035X260CM (WIRE) ×2
KIT ENCORE 26 ADVANTAGE (KITS) ×2 IMPLANT
KIT HEART LEFT (KITS) ×2 IMPLANT
MAT PREVALON FULL STRYKER (MISCELLANEOUS) ×2 IMPLANT
PACK CARDIAC CATHETERIZATION (CUSTOM PROCEDURE TRAY) ×2 IMPLANT
SHEATH PINNACLE 5F 10CM (SHEATH) IMPLANT
SHEATH PINNACLE 6F 10CM (SHEATH) ×4 IMPLANT
SHEATH PINNACLE 8F 10CM (SHEATH) ×2 IMPLANT
SHEATH PROBE COVER 6X72 (BAG) ×2 IMPLANT
SLEEVE REPOSITIONING LENGTH 30 (MISCELLANEOUS) ×2 IMPLANT
STENT ONYX FRONTIER 3.0X18 (Permanent Stent) ×2 IMPLANT
TRANSDUCER W/STOPCOCK (MISCELLANEOUS) ×2 IMPLANT
TUBING CIL FLEX 10 FLL-RA (TUBING) ×2 IMPLANT
WIRE ASAHI PROWATER 180CM (WIRE) ×2 IMPLANT
WIRE EMERALD 3MM-J .025X260CM (WIRE) ×2 IMPLANT
WIRE EMERALD ST .035X150CM (WIRE) IMPLANT
WIRE HI TORQ VERSACORE-J 145CM (WIRE) ×2 IMPLANT

## 2020-09-19 NOTE — Progress Notes (Signed)
R femoral arterial sheath removed at 21:10 per order. ACT confirmed <150. RN Ronne Binning assisting. Manual pressure held for . Venous sheath removed at 21:20, manual pressure held for 10 minutes. Sites dressed with gauze and tegaderm.  Both sites level 0 throughout, +2 DPs. Pt tolerated well, educated about mobility restrictions and after-care. VSS.

## 2020-09-19 NOTE — ED Notes (Signed)
Pt being transported to Cath lab at this time.

## 2020-09-19 NOTE — ED Triage Notes (Signed)
Pt arrives as CODE STEMI from [facility]. Pt reports feeling lightheaded while working today. Pt was noted to be hypotensive upon arrival and ST elevation. Pt received 324 ASA and 400 Nacl en route.

## 2020-09-19 NOTE — ED Provider Notes (Signed)
[facility] EMERGENCY DEPARTMENT Provider Note   CSN: [id number] Arrival date & time: 09/19/20  1020     History Chief Complaint  Patient presents with   Code STEMI    Tommy Liu is a 55 y.o. male.  HPI    55 year old male with history of CAD, diabetes, hypertension comes in with chief complaint of abnormal EKG and dizziness.  Patient was at work and started feeling lightheaded.  He felt like he was going to pass out.  EMS was called and patient was noted to be hypotensive.  EKG shows anterior ST elevation and code STEMI was activated on the field.  Patient denies any chest pain.  Reports that he really started feeling unwell today compared to yesterday.  With his prior MI his symptoms were left-sided tingling.  Pt has no hx of PE, DVT and denies any exogenous hormone (testosterone / estrogen) use, long distance travels or surgery in the past 6 weeks, active cancer, recent immobilization.  He reports that he is taking his medications as prescribed.  No abdominal pain, back pain, bloody stools.  Past Medical History:  Diagnosis Date   CAD in native artery    a. Cath 05/2017 -  90% pRCA (small & nondominant) and 40% pLAD >> medical mangment    Diabetes mellitus without complication (HCC)    Hypertension    Morbid obesity (HCC)    Obstructive sleep apnea     Patient Active Problem List   Diagnosis Date Noted   Acute ST elevation myocardial infarction (STEMI) involving left anterior descending coronary artery (HCC) 09/19/2020   Acute combined systolic and diastolic heart failure (HCC) 09/19/2020   Cardiogenic shock (HCC) - Borderline 09/19/2020   Coronary artery disease involving native coronary artery of native heart with unstable angina pectoris (HCC) 09/19/2020   Presence of drug coated stent in LAD coronary artery 09/19/2020   Hyperlipidemia associated with type 2 diabetes mellitus (HCC) 09/19/2020   DM (diabetes mellitus), type 2 (HCC)  05/08/2017   Morbid obesity (HCC) 05/08/2017   Obstructive sleep apnea 05/08/2017   ACS (acute coronary syndrome) (HCC) 05/06/2017    Past Surgical History:  Procedure Laterality Date   CORONARY STENT INTERVENTION N/A 09/19/2020   Procedure: CORONARY STENT INTERVENTION;  Surgeon: Marykay Lex, MD;  Location: [facility] INVASIVE CV LAB;  Service: Cardiovascular;  Laterality: N/A;   CORONARY/GRAFT ACUTE MI REVASCULARIZATION N/A 09/19/2020   Procedure: Coronary/Graft Acute MI Revascularization;  Surgeon: Marykay Lex, MD;  Location: [facility] INVASIVE CV LAB;  Service: Cardiovascular;  Laterality: N/A;   LEFT HEART CATH AND CORONARY ANGIOGRAPHY N/A 05/07/2017   Procedure: LEFT HEART CATH AND CORONARY ANGIOGRAPHY;  Surgeon: Swaziland, Peter M, MD;  Location: [facility] INVASIVE CV LAB;  Service: Cardiovascular;  Laterality: N/A;   LEFT HEART CATH AND CORONARY ANGIOGRAPHY N/A 09/19/2020   Procedure: LEFT HEART CATH AND CORONARY ANGIOGRAPHY;  Surgeon: Marykay Lex, MD;  Location: [facility] INVASIVE CV LAB;  Service: Cardiovascular;  Laterality: N/A;   RIGHT HEART CATH N/A 09/19/2020   Procedure: RIGHT HEART CATH;  Surgeon: Marykay Lex, MD;  Location: [facility] INVASIVE CV LAB;  Service: Cardiovascular;  Laterality: N/A;   ULTRASOUND GUIDANCE FOR VASCULAR ACCESS  05/07/2017   Procedure: Ultrasound Guidance For Vascular Access;  Surgeon: Swaziland, Peter M, MD;  Location: [facility] INVASIVE CV LAB;  Service: Cardiovascular;;       Family History  Problem Relation Age of Onset   Hypertension Father     Social History  Tobacco Use   Smoking status: Former    Packs/day: 1.00    Years: 30.00    Pack years: 30.00    Types: Cigarettes    Quit date: 01/02/1997    Years since quitting: 23.7   Smokeless tobacco: Former  Building services engineer Use: Never used  Substance Use Topics   Alcohol use: Yes    Alcohol/week: 2.0 standard drinks    Types: 2 Cans of beer per week   Drug use: Never    Home Medications Prior to Admission  medications   Medication Sig Start Date End Date Taking? Authorizing Provider  acetaminophen (TYLENOL) 325 MG tablet Take 650 mg by mouth every 6 (six) hours as needed for moderate pain.   Yes [provider]  amLODipine (NORVASC) 10 MG tablet Take 10 mg by mouth daily.   Yes [provider]  aspirin EC 81 MG EC tablet Take 1 tablet (81 mg total) by mouth daily. 05/09/17  Yes Georgie Chard D, NP  atenolol (TENORMIN) 25 MG tablet Take 1 tablet (25 mg total) by mouth daily. 09/12/17  Yes Nahser, Deloris Ping, MD  atorvastatin (LIPITOR) 80 MG tablet Take 40 mg by mouth at bedtime.   Yes [provider]  clotrimazole (LOTRIMIN) 1 % cream Apply 1 application topically 2 (two) times daily as needed (toe pain). 03/06/20  Yes [provider]  doxycycline (MONODOX) 100 MG capsule Take 1 capsule by mouth 2 (two) times daily. 07/29/20  Yes [provider]  empagliflozin (JARDIANCE) 25 MG TABS tablet Take 12.5 mg by mouth daily.   Yes [provider]  glipiZIDE (GLUCOTROL) 10 MG tablet Take 10 mg by mouth 2 (two) times daily before a meal.    Yes [provider]  hydrochlorothiazide (HYDRODIURIL) 25 MG tablet Take 25 mg by mouth daily.    Yes [provider]  isosorbide mononitrate (IMDUR) 60 MG 24 hr tablet Take 1 tablet (60 mg total) by mouth daily. 09/12/17 09/19/20 Yes Nahser, Deloris Ping, MD  lisinopril (PRINIVIL,ZESTRIL) 5 MG tablet Take 1 tablet (5 mg total) by mouth daily. 05/09/17  Yes Georgie Chard D, NP  metFORMIN (GLUCOPHAGE) 500 MG tablet Take 1,000 mg by mouth 2 (two) times daily with a meal.    Yes [provider]  nitroGLYCERIN (NITROSTAT) 0.4 MG SL tablet Place 1 tablet (0.4 mg total) under the tongue every 5 (five) minutes x 3 doses as needed for chest pain. 05/08/17  Yes Georgie Chard D, NP  rosuvastatin (CRESTOR) 10 MG tablet Take 1 tablet (10 mg total) by mouth daily. 09/14/17  Yes Nahser, Deloris Ping, MD  Semaglutide,0.25 or  0.5MG /DOS, 2 MG/1.5ML SOPN Inject 0.5 mg as directed once a week. 08/30/20  Yes [provider]  tamsulosin (FLOMAX) 0.4 MG CAPS capsule Take 1 capsule by mouth daily. 08/12/20  Yes [provider]  Choline Fenofibrate 135 MG capsule Take 1 capsule (135 mg total) by mouth daily. Patient not taking: No sig reported 09/14/17   Nahser, Deloris Ping, MD    Allergies    Penicillins  Review of Systems   Review of Systems  Constitutional:  Positive for activity change.  Respiratory:  Negative for shortness of breath.   Cardiovascular:  Negative for chest pain.  Gastrointestinal:  Negative for blood in stool, nausea and vomiting.  Neurological:  Positive for dizziness and light-headedness.  All other systems reviewed and are negative.  Physical Exam Updated Vital Signs BP 111/81   Pulse 66  Temp 98.4 F (36.9 C) (Oral)   Resp 11   Ht 6' 2.02" (1.88 m)   Wt 105.7 kg   SpO2 97%   BMI 29.91 kg/m   Physical Exam Vitals and nursing note reviewed.  Constitutional:      Appearance: He is well-developed.  HENT:     Head: Atraumatic.  Cardiovascular:     Rate and Rhythm: Normal rate.     Pulses: Normal pulses.  Pulmonary:     Effort: Pulmonary effort is normal.  Abdominal:     Palpations: There is no mass.     Tenderness: There is no abdominal tenderness.  Musculoskeletal:     Cervical back: Neck supple.     Right lower leg: Edema present.     Left lower leg: Edema present.  Skin:    General: Skin is warm.     Coloration: Skin is pale.  Neurological:     Mental Status: He is alert and oriented to person, place, and time.    ED Results / Procedures / Treatments   Labs (all labs ordered are listed, but only abnormal results are displayed) Labs Reviewed  HEMOGLOBIN A1C - Abnormal; Notable for the following components:      Result Value   Hgb A1c MFr Bld 11.4 (*)    All other components within normal limits  CBC WITH DIFFERENTIAL/PLATELET - Abnormal; Notable for  the following components:   RBC 4.11 (*)    Hemoglobin 12.4 (*)    HCT 38.8 (*)    All other components within normal limits  COMPREHENSIVE METABOLIC PANEL - Abnormal; Notable for the following components:   Sodium 134 (*)    Potassium 5.4 (*)    Chloride 97 (*)    Glucose, Bld 494 (*)    Total Protein 6.4 (*)    Albumin 3.0 (*)    AST 11 (*)    All other components within normal limits  LIPID PANEL - Abnormal; Notable for the following components:   HDL 34 (*)    LDL Cholesterol 109 (*)    All other components within normal limits  BASIC METABOLIC PANEL - Abnormal; Notable for the following components:   Potassium 2.7 (*)    Glucose, Bld 317 (*)    Creatinine, Ser 0.37 (*)    Calcium 7.2 (*)    All other components within normal limits  GLUCOSE, CAPILLARY - Abnormal; Notable for the following components:   Glucose-Capillary 338 (*)    All other components within normal limits  MAGNESIUM - Abnormal; Notable for the following components:   Magnesium 1.4 (*)    All other components within normal limits  BASIC METABOLIC PANEL - Abnormal; Notable for the following components:   Sodium 130 (*)    Chloride 95 (*)    Glucose, Bld 307 (*)    Creatinine, Ser 0.45 (*)    Calcium 8.7 (*)    All other components within normal limits  CBC - Abnormal; Notable for the following components:   RBC 4.12 (*)    Hemoglobin 12.5 (*)    HCT 38.4 (*)    All other components within normal limits  HEMOGLOBIN A1C - Abnormal; Notable for the following components:   Hgb A1c MFr Bld 11.9 (*)    All other components within normal limits  LIPID PANEL - Abnormal; Notable for the following components:   Triglycerides 168 (*)    HDL 32 (*)    LDL Cholesterol 105 (*)    All  other components within normal limits  GLUCOSE, CAPILLARY - Abnormal; Notable for the following components:   Glucose-Capillary 332 (*)    All other components within normal limits  GLUCOSE, CAPILLARY - Abnormal; Notable for the  following components:   Glucose-Capillary 291 (*)    All other components within normal limits  TROPONIN I (HIGH SENSITIVITY) - Abnormal; Notable for the following components:   Troponin I (High Sensitivity) 533 (*)    All other components within normal limits  TROPONIN I (HIGH SENSITIVITY) - Abnormal; Notable for the following components:   Troponin I (High Sensitivity) 492 (*)    All other components within normal limits  TROPONIN I (HIGH SENSITIVITY) - Abnormal; Notable for the following components:   Troponin I (High Sensitivity) 681 (*)    All other components within normal limits  RESP PANEL BY RT-PCR (FLU A&B, COVID) ARPGX2  MRSA NEXT GEN BY PCR, NASAL  PROTIME-INR  APTT  LACTIC ACID, PLASMA  LACTIC ACID, PLASMA  COOXEMETRY PANEL  COOXEMETRY PANEL  POCT ACTIVATED CLOTTING TIME    EKG EKG Interpretation  Date/Time:  Sunday September 19 2020 10:26:38 EDT Ventricular Rate:  60 PR Interval:    QRS Duration: 115 QT Interval:  485 QTC Calculation: 485 R Axis:   -73 Text Interpretation: Atrial fibrillation Nonspecific IVCD with LAD Extensive anterior infarct, recent STEMI Confirmed by Derwood Kaplan ([id number]) on 09/19/2020 10:30:12 AM Also confirmed by Derwood Kaplan [phone]), editor [place], LaVerne ([id number])  on 09/20/2020 8:44:50 AM  Radiology CARDIAC CATHETERIZATION  Result Date: 09/19/2020   Culprit Lesion: Mid LAD lesion is 90% stenosed.   A drug-eluting stent was successfully placed using a STENT ONYX FRONTIER 3.0X18 -> postdilated to 3.3 mm   Post intervention, there is a 0% residual stenosis.   Mid LAD to Dist LAD lesion is 50% stenosed with 40% stenosed side branch in 3rd Diag.   Dist LAD lesion is 100% stenosed -> likely distal embolization from initial MI.   1st Mrg lesion is 100% stenosed.  Appears to be CTO (will reassess with interventional colleagues.)   Prox RCA lesion is 90% stenosed.   ------------------------------------------   There is severe left ventricular  systolic dysfunction.  The left ventricular ejection fraction is 25-35% by visual estimate.  With anterior wall abnormalities consistent with LAD infarct   LV end diastolic pressure is moderately elevated -opening LVEDP 23 mmHg, but post PCI PCWP was 16 mmHg .   There is mild (2+) mitral regurgitation. SUMMARY Left Dominant System: Severe single-vessel disease with 90% thrombotic stenosis of the mid LAD just after 2nd Diag, with diffuse 40 to 50% disease in the mid vessel distally. ->  Likely downstream embolization from the original event with occlusion of the apical LAD noted. 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  (Could consider attempted PCI of this vessel prior to discharge) Severely reduced EF roughly 25% with anterior wall hypokinesis and apical akinesis.  LVEDP elevated at 23 mmHg in the setting of hypotension (systolic pressures 82/57) --> ACUTE COMBINED SYSTOLIC AND DIASTOLIC HEART FAILURE Borderline Cardiogenic Shock initially requiring vasopressors, blood pressures improved with revascularization.  Weaning off pressors.  (Did not meet criteria for Impella support) RECOMMENDATIONS Admit to CVICU.  Have discussed with advanced heart failure team-Dr. Shirlee Latch would like for [place] to place a PICC line once venous sheath is removed in order to adequately  titrate off the Levophed. Will check serial lactate levels and cycle troponins. Need to be assess EF by echo prior to discharge, but would not check post cath day 1 in order to allow time for recuperation. Review cath images with interventional colleagues to determine if it is feasible to attempt PCI of the OM prior to discharge. Bryan Lemma, MD  DG Chest Port 1 View  Result Date: 09/19/2020 CLINICAL DATA:  Chest pain EXAM: PORTABLE CHEST 1 VIEW COMPARISON:  January 23, 2019 FINDINGS: The heart size and mediastinal contours are within  normal limits. Both lungs are clear. The visualized skeletal structures are unremarkable. IMPRESSION: No active disease. Electronically Signed   By: Gerome Sam III M.D.   On: 09/19/2020 15:03   [place] EKG SITE RITE  Result Date: 09/19/2020 If Site Rite image not attached, placement could not be confirmed due to current cardiac rhythm.   Procedures .Critical Care Performed by: Derwood Kaplan, MD Authorized by: Derwood Kaplan, MD   Critical care provider statement:    Critical care time (minutes):  35   Critical care was necessary to treat or prevent imminent or life-threatening deterioration of the following conditions:  Cardiac failure, circulatory failure and shock   Critical care was time spent personally by me on the following activities:  Discussions with consultants, evaluation of patient's response to treatment, examination of patient, ordering and performing treatments and interventions, ordering and review of laboratory studies, ordering and review of radiographic studies, pulse oximetry, re-evaluation of patient's condition, obtaining history from patient or surrogate and review of old charts   Medications Ordered in ED Medications  hydrALAZINE (APRESOLINE) injection 10 mg (has no administration in time range)  acetaminophen (TYLENOL) tablet 650 mg (has no administration in time range)  ondansetron (ZOFRAN) injection 4 mg (has no administration in time range)  heparin injection 5,000 Units (5,000 Units Subcutaneous Given 09/20/20 0526)  aspirin chewable tablet 81 mg (has no administration in time range)  ticagrelor (BRILINTA) tablet 90 mg (90 mg Oral Given 09/19/20 2200)  0.9 %  sodium chloride infusion ( Intravenous Paused 09/19/20 1917)  sodium chloride flush (NS) 0.9 % injection 3 mL (10 mLs Intravenous Not Given 09/19/20 1508)  sodium chloride flush (NS) 0.9 % injection 3 mL (has no administration in time range)  0.9 %  sodium chloride infusion (has no administration in time  range)  dapagliflozin propanediol (FARXIGA) tablet 10 mg (has no administration in time range)  rosuvastatin (CRESTOR) tablet 40 mg (has no administration in time range)  insulin aspart (novoLOG) injection 0-15 Units (8 Units Subcutaneous Given 09/20/20 0845)  insulin aspart (novoLOG) injection 0-5 Units (4 Units Subcutaneous Given 09/19/20 2200)  0.9 %  sodium chloride infusion (250 mLs Intravenous Not Given 09/19/20 1323)  tirofiban (AGGRASTAT) infusion 50 mcg/mL 100 mL (0 mcg/kg/min  105.7 kg Intravenous Stopping Infusion hung by another clincian 09/20/20 0112)  0.9 %  sodium chloride infusion ( Intravenous Infusion Verify 09/20/20 0600)  norepinephrine (LEVOPHED)  in premix infusion (0 mcg/min Intravenous Stopped 09/19/20 2154)  sodium chloride flush (NS) 0.9 % injection 10-40 mL (10 mLs Intracatheter Given 09/19/20 2300)  sodium chloride flush (NS) 0.9 % injection 10-40 mL (has no administration in time range)  Chlorhexidine Gluconate Cloth 2 % PADS 6 each (6 each Topical Given 09/19/20 1800)  MEDLINE mouth rinse (has no administration in time range)  furosemide (LASIX) injection 40 mg (has no administration in time range)  potassium chloride SA (KLOR-CON) CR tablet 40 mEq (has  no administration in time range)  spironolactone (ALDACTONE) tablet 12.5 mg (has no administration in time range)  heparin injection 4,000 Units ( Intravenous MAR Unhold 09/19/20 1333)  heparin 5000 UNIT/ML injection (  Not Given 09/19/20 1848)  0.9 %  sodium chloride infusion (0 mLs  Stopped 09/19/20 1301)  tirofiban (AGGRASTAT) infusion 50 mcg/mL 100 mL (0.15 mcg/kg/min  99 kg (Order-Specific) Intravenous New Bag/Given 09/19/20 1224)  furosemide (LASIX) injection 40 mg (40 mg Intravenous Given 09/19/20 1503)  potassium chloride SA (KLOR-CON) CR tablet 40 mEq (40 mEq Oral Given 09/20/20 0034)    ED Course  I have reviewed the triage vital signs and the nursing notes.  Pertinent labs & imaging results that were  available during my care of the patient were reviewed by me and considered in my medical decision making (see chart for details).    MDM Rules/Calculators/A&P                            55 year old comes in with chief complaint of dizziness.  Noted to have hypotension with abnormal findings on the EKG.  STEMI was activated on field.  Patient arrives with hypotension.  Fluid bolus given, blood pressure did improve.  However after the bolus was completed, blood pressure dropped again.  Norepinephrine ordered.  No chest pain, shortness of breath.  Still, concerns are high for acute MI given the EKG findings.  Patient has no abdominal pain, back pain to be concerned for AAA or dissection at this time.  Dissection is a possibility however.  Other considerations include myocarditis, valvular disorder.  Patient has received aspirin per EMS.  Heparin bolus given in the ED.  Dr. Herbie Baltimore to take over the care.  Final Clinical Impression(s) / ED Diagnoses Final diagnoses:  Cardiogenic shock (HCC)  ST elevation myocardial infarction (STEMI), unspecified artery [facility])    Rx / DC Orders ED Discharge Orders     None        Derwood Kaplan, MD 09/20/20 [phone]

## 2020-09-19 NOTE — Progress Notes (Signed)
ANTICOAGULATION CONSULT NOTE  Pharmacy Consult for tirofiban Indication: chest pain/ACS  Allergies  Allergen Reactions   Penicillins Anaphylaxis and Other (See Comments)    Has patient had a PCN reaction causing immediate rash, facial/tongue/throat swelling, SOB or lightheadedness with hypotension: Yes Has patient had a PCN reaction causing severe rash involving mucus membranes or skin necrosis: No Has patient had a PCN reaction that required hospitalization: Unknown Has patient had a PCN reaction occurring within the last 10 years: No If all of the above answers are "NO", then may proceed with Cephalosporin use.     Patient Measurements: Height: 6' 2.02" (188 cm) Weight: 105.7 kg (233 lb 0.4 oz) IBW/kg (Calculated) : 82.24  Vital Signs: Temp: 98.1 F (36.7 C) (09/18 1028) Temp Source: Oral (09/18 1028) BP: 82/61 (09/18 1029) Pulse Rate: 60 (09/18 1029)  Labs: Recent Labs    09/19/20 1031  HGB 12.4*  HCT 38.8*  PLT 279  APTT 24  LABPROT 13.8  INR 1.1  CREATININE 0.70  TROPONINIHS 533*    Estimated Creatinine Clearance: 136.8 mL/min (by C-G formula based on SCr of 0.7 mg/dL).   Medical History: Past Medical History:  Diagnosis Date   CAD in native artery    a. Cath 05/2017 -  90% pRCA (small & nondominant) and 40% pLAD >> medical mangment    Diabetes mellitus without complication (HCC)    Hypertension    Morbid obesity (HCC)    Obstructive sleep apnea     Assessment: 78 yoM admitted as code STEMI s/p DES to LAD. Tirofiban started in cath lab, to continue x12h. Baseline CBC wnl.   Plan:  Tirofiban 0.15 mcg/kg/min x12h  Fredonia Highland, PharmD, BCPS, [facility] Clinical Pharmacist [phone] Please check AMION for all [facility] Pharmacy numbers 09/19/2020

## 2020-09-19 NOTE — Progress Notes (Signed)
Dr. Lysle Morales (cards fellow) notified of critical KCL 2.7. Crt 0.37, UOP good. Takes POs, has CVC. EF 25%.  MD ordered PO (one dose now, another in 4 hours) and an AM BMet.

## 2020-09-19 NOTE — Consult Note (Signed)
Advanced Heart Failure Team Consult Note   Primary Physician: [facility] PCP-Cardiologist:  Kristeen Miss, MD  Reason for Consultation: Early cardiogenic shock  HPI:    Tommy Liu is seen today for evaluation of shock at the request of Dr. Herbie Baltimore.   55 y.o. with history of CAD, HTN, type 2 diabetes, OSA but not on CPAP presented today with acute anterior MI.  Patient was last admitted in 5/19 with unstable angina.  Cath showed 90% proximal stenosis of nondominant RCA and 40% stenosis proximal LAD.  Echo showed EF 60-65%.  He was managed medically.  He was lost to followup as an outpatient.   This morning, he was at work and began to feel lightheaded and almost passed out.  EMS was called, he was found to be hypotensive with SBP in 70s and ECG showed anterior STE.  Patient had not had chest pain or dyspnea, but he had had periodic dizziness over the last few days.  Code STEMI called in field, he was brought to cath lab.  He was started on norepinephrine for low BP.  Cath was done showing 90% thrombotic stenosis in the mid LAD with suspected embolic disease causing occlusion of the distal LAD.  OM1 was chronically occluded with collaterals, 90% stenosis proximal nondominant RCA.  EF was low at 25%.  RHC showed mean RA 9, PA 26/11, mean PCWP 16 with LVEDP 23, CI 2.46.  Patient was treated with DES to mid LAD.   Currently, SBP 110s on norepinephrine 5 (titrating down).  No complaints.  Creatinine 0.7, lactate normal.   Review of Systems: All systems reviewed and negative except as per HPI.   Home Medications Prior to Admission medications   Medication Sig Start Date End Date Taking? Authorizing Provider  aspirin EC 81 MG EC tablet Take 1 tablet (81 mg total) by mouth daily. 05/09/17  Yes Georgie Chard D, NP  atenolol (TENORMIN) 25 MG tablet Take 1 tablet (25 mg total) by mouth daily. 09/12/17  Yes Nahser, Deloris Ping, MD  clotrimazole (LOTRIMIN) 1 % cream APPLY MODERATE  AMOUNT TOPICALLY TWO TIMES A DAY 03/06/20  Yes [provider]  doxycycline (MONODOX) 100 MG capsule Take 1 capsule by mouth 2 (two) times daily. 07/29/20  Yes [provider]  glipiZIDE (GLUCOTROL) 10 MG tablet Take 10 mg by mouth 2 (two) times daily before a meal.    Yes [provider]  hydrochlorothiazide (HYDRODIURIL) 25 MG tablet Take 25 mg by mouth daily.    Yes [provider]  isosorbide mononitrate (IMDUR) 60 MG 24 hr tablet Take 1 tablet (60 mg total) by mouth daily. 09/12/17 09/19/20 Yes Nahser, Deloris Ping, MD  lisinopril (PRINIVIL,ZESTRIL) 5 MG tablet Take 1 tablet (5 mg total) by mouth daily. 05/09/17  Yes Georgie Chard D, NP  metFORMIN (GLUCOPHAGE) 500 MG tablet Take 1,000 mg by mouth 2 (two) times daily with a meal.    Yes [provider]  nitroGLYCERIN (NITROSTAT) 0.4 MG SL tablet Place 1 tablet (0.4 mg total) under the tongue every 5 (five) minutes x 3 doses as needed for chest pain. 05/08/17  Yes Georgie Chard D, NP  rosuvastatin (CRESTOR) 10 MG tablet Take 1 tablet (10 mg total) by mouth daily. 09/14/17  Yes Nahser, Deloris Ping, MD  Choline Fenofibrate 135 MG capsule Take 1 capsule (135 mg total) by mouth daily. 09/14/17   Nahser, Deloris Ping, MD    Past Medical History: Past Medical History:  Diagnosis Date   CAD in native artery    a. Cath 05/2017 -  90% pRCA (small & nondominant) and 40% pLAD >> medical mangment    Diabetes mellitus without complication (HCC)    Hypertension    Morbid obesity (HCC)    Obstructive sleep apnea     Past Surgical History: Past Surgical History:  Procedure Laterality Date   LEFT HEART CATH AND CORONARY ANGIOGRAPHY N/A 05/07/2017   Procedure: LEFT HEART CATH AND CORONARY ANGIOGRAPHY;  Surgeon: Swaziland, Peter M, MD;  Location: [facility] INVASIVE CV LAB;  Service: Cardiovascular;  Laterality: N/A;   ULTRASOUND GUIDANCE FOR VASCULAR ACCESS  05/07/2017   Procedure: Ultrasound Guidance For Vascular Access;  Surgeon: Swazil[place],  Peter M, MD;  Location[facility] INVASIVE CV LAB;  Service: Cardiovascular;;    Family History: Family History  Problem Relation Age of Onset   Hypertension Father     Social History: Social History   Socioeconomic History   Marital status: Divorced    Spouse name: Not on file   Number of children: Not on file   Years of education: Not on file   Highest education level: Not on file  Occupational History   Not on file  Tobacco Use   Smoking status: Former    Packs/day: 1.00    Years: 30.00    Pack years: 30.00    Types: Cigarettes    Quit date: 01/02/1997    Years since quitting: 23.7   Smokeless tobacco: Former  Building services engineer Use: Never used  Substance and Sexual Activity   Alcohol use: Yes    Alcohol/week: 2.0 standard drinks    Types: 2 Cans of beer per week   Drug use: Never   Sexual activity: Not on file  Other Topics Concern   Not on file  Social History Narrative   Not on file   Social Determinants of Health   Financial Resource Strain: Not on file  Food Insecurity: Not on file  Transportation Needs: Not on file  Physical Activity: Not on file  Stress: Not on file  Social Connections: Not on file    Allergies:  Allergies  Allergen Reactions   Penicillins Anaphylaxis and Other (See Comments)    Has patient had a PCN reaction causing immediate rash, facial/tongue/throat swelling, SOB or lightheadedness with hypotension: Yes Has patient had a PCN reaction causing severe rash involving mucus membranes or skin necrosis: No Has patient had a PCN reaction that required hospitalization: Unknown Has patient had a PCN reaction occurring within the last 10 years: No If all of the above answers are "NO", then may proceed with Cephalosporin use.     Objective:    Vital Signs:   Temp:  [98.1 F (36.7 C)] 98.1 F (36.7 C) (09/18 1028) Pulse Rate:  [60] 60 (09/18 1029) Resp:  [22] 22 (09/18 1029) BP: (82)/(61) 82/61 (09/18 1029) SpO2:  [95 %-100 %] 95 %  (09/18 1117) Weight:  [105.7 kg] 105.7 kg (09/18 1300)    Weight change: Filed Weights   09/19/20 1300  Weight: 105.7 kg    Intake/Output:  No intake or output data in the 24 hours ending 09/19/20 1449    Physical Exam    General:  Well appearing. No resp difficulty HEENT: normal Neck: Thick. JVP 8-9. Carotids 2+ bilat; no bruits. No lymphadenopathy or thyromegaly appreciated. Cor: PMI nondisplaced. Regular rate & rhythm. No rubs, gallops or murmurs. Lungs: clear Abdomen: soft, nontender, nondistended. No hepatosplenomegaly. No bruits  or masses. Good bowel sounds. Extremities: no cyanosis, clubbing, rash, edema Neuro: alert & orientedx3, cranial nerves grossly intact. moves all 4 extremities w/o difficulty. Affect pleasant   Telemetry   NSR 80s (personally reviewed)  EKG    NSR, acute anterior MI (personally reviewed)  Labs   Basic Metabolic Panel: Recent Labs  Lab 09/19/20 1031  NA 134*  K 5.4*  CL 97*  CO2 28  GLUCOSE 494*  BUN 8  CREATININE 0.70  CALCIUM 9.0    Liver Function Tests: Recent Labs  Lab 09/19/20 1031  AST 11*  ALT 17  ALKPHOS 93  BILITOT 0.6  PROT 6.4*  ALBUMIN 3.0*   No results for input(s): LIPASE, AMYLASE in the last 168 hours. No results for input(s): AMMONIA in the last 168 hours.  CBC: Recent Labs  Lab 09/19/20 1031  WBC 7.0  NEUTROABS 4.0  HGB 12.4*  HCT 38.8*  MCV 94.4  PLT 279    Cardiac Enzymes: No results for input(s): CKTOTAL, CKMB, CKMBINDEX, TROPONINI in the last 168 hours.  BNP: BNP (last 3 results) No results for input(s): BNP in the last 8760 hours.  ProBNP (last 3 results) No results for input(s): PROBNP in the last 8760 hours.   CBG: No results for input(s): GLUCAP in the last 168 hours.  Coagulation Studies: Recent Labs    09/19/20 1031  LABPROT 13.8  INR 1.1     Imaging   CARDIAC CATHETERIZATION  Result Date: 09/19/2020   Culprit Lesion: Mid LAD lesion is 90% stenosed.   A  drug-eluting stent was successfully placed using a STENT ONYX FRONTIER 3.0X18 -> postdilated to 3.3 mm   Post intervention, there is a 0% residual stenosis.   Mid LAD to Dist LAD lesion is 50% stenosed with 40% stenosed side branch in 3rd Diag.   Dist LAD lesion is 100% stenosed -> likely distal embolization from initial MI.   1st Mrg lesion is 100% stenosed.  Appears to be CTO (will reassess with interventional colleagues.)   Prox RCA lesion is 90% stenosed.   ------------------------------------------   There is severe left ventricular systolic dysfunction.  The left ventricular ejection fraction is 25-35% by visual estimate.  With anterior wall abnormalities consistent with LAD infarct   LV end diastolic pressure is moderately elevated -opening LVEDP 23 mmHg, but post PCI PCWP was 16 mmHg .   There is mild (2+) mitral regurgitation. SUMMARY Left Dominant System: Severe single-vessel disease with 90% thrombotic stenosis of the mid LAD just after 2nd Diag, with diffuse 40 to 50% disease in the mid vessel distally. ->  Likely downstream embolization from the original event with occlusion of the apical LAD noted. 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  (Could consider attempted PCI of this vessel prior to discharge) Severely reduced EF roughly 25% with anterior wall hypokinesis and apical akinesis.  LVEDP elevated at 23 mmHg in the setting of hypotension (systolic pressures 82/57) --> ACUTE COMBINED SYSTOLIC AND DIASTOLIC HEART FAILURE Borderline Cardiogenic Shock initially requiring vasopressors, blood pressures improved with revascularization.  Weaning off pressors.  (Did not meet criteria for Impella support) RECOMMENDATIONS Admit to CVICU.  Have discussed with advanced heart failure team-Dr. Shirlee Latch would like for [place] to place a PICC line once venous sheath is removed in order to adequately  titrate off the Levophed. Will check serial lactate levels  and cycle troponins. Need to be assess EF by echo prior to discharge, but would not check post cath day 1 in order to allow time for recuperation. Review cath images with interventional colleagues to determine if it is feasible to attempt PCI of the OM prior to discharge. Bryan Lemma, MD  [place] EKG SITE RITE  Result Date: 09/19/2020 If Site Rite image not attached, placement could not be confirmed due to current cardiac rhythm.    Medications:     Current Medications:  [START ON 09/20/2020] aspirin  81 mg Oral Daily   [START ON 09/20/2020] dapagliflozin propanediol  10 mg Oral Daily   furosemide  40 mg Intravenous Once   heparin       heparin  5,000 Units Subcutaneous Q8H   insulin aspart  0-15 Units Subcutaneous TID WC   insulin aspart  0-5 Units Subcutaneous QHS   [START ON 09/20/2020] rosuvastatin  40 mg Oral Daily   sodium chloride flush  3 mL Intravenous Q12H   ticagrelor  90 mg Oral BID    Infusions:  sodium chloride 100 mL/hr at 09/19/20 1303   sodium chloride     sodium chloride     sodium chloride     norepinephrine (LEVOPHED) Adult infusion 5 mcg/min (09/19/20 1341)   tirofiban 0.15 mcg/kg/min (09/19/20 1408)     Assessment/Plan   1. CAD: History of unstable angina 5/19, medically managed (90% stenosis nondominant RCA).  Admitted with acute anterior MI.  Cath with 90% thrombotic stenosis mid LAD, suspected embolic occlusion distal LAD, CTO OM1 with collaterals, 90% nondominant RCA.  Patient treated with DES to m LAD.  No chest pain.  BP improved post-procedure.  - Continue ticagrelor and ASA 81.  - Aggrastat to continue until this evening.  - Crestor 40 mg daily.  - Will review OM1 with Dr Herbie Baltimore, with CTO with collaterals, would probably manage medically.  2. Acute systolic CHF/early cardiogenic shock: LV-gram with EF 25%, ischemic cardiomyopathy.  Patient was hypotensive pre-PCI, but post-PCI BP improved.  He  remains on norepinephrine 5 but titrating down, SBP 110s.  Cardiac output preserved on RHC and PCWP and RAP were normal but LVEDP was elevated at 23.  Lactate normal.  - Formal echo tomorrow.  - Wean down norepinephrine.  - Place PICC, follow CVP and co-ox.  - Trend lactate.  - On dapagliflozin 10 mg daily.  - Need to repeat BMET (high K but may have been hemolyzed).  - Begin to add GDMT tomorrow if he is off pressors.  3. Diabetes: SSI, check A1c.  4. OSA: Strong suspicion.  Needs sleep study.   Length of Stay: 0  Marca Ancona, MD  09/19/2020, 2:49 PM  Advanced Heart Failure Team Pager [phone] (M-F; 7a - 5p)  Please contact [facility] for night-coverage after hours (4p -7a ) and weekends on [email or web address]

## 2020-09-19 NOTE — H&P (Addendum)
Cardiology Admission History and Physical:   Patient ID: Tommy Liu MRN: [id number]; DOB: [birth year]   Admission date: 09/19/2020  PCP:  [facility]   [facility] Providers Cardiologist:  Kristeen Miss, MD     Chief Complaint:  Chest pain/STEMI  Patient Profile:   Tommy Liu is a 55 y.o. male with CAD (50%pLADm 90% non-dominant pRCA '19), HTN, HL, DM, OSA, obesity who is being seen 09/19/2020 for the evaluation of chest pain/STEMI.  History of Present Illness:   Tommy Liu is a 55 yo male with PMH noted above. He was followed by Dr. Elease Hashimoto in 2019 when he presented to [facility] with unstable angina.  He was transferred to [facility] for further evaluation and underwent cardiac catheterization which showed mild to moderate disease in the LAD of 50% along with 90% proximal RCA lesion, though nondominant vessel.  Recommendations were for continued medical management.  Echocardiogram showed EF of 55 to 65%.  Hemoglobin A1c during that admission was 9.9.  He was discharged on glipizide and metformin along with Zocor 40 mg daily, 81 mg aspirin, atenolol 12.5 mg daily hydrochlorothiazide 25 mg daily, lisinopril 5 mg daily and Imdur 30mg  daily.   He was last seen in the office 09/2017 with Dr. 10/2017.  Stated he continued to have episodes of chest pain especially with exertion.  Would take sublingual nitroglycerin for the symptoms.  His atenolol was increased to 25 mg daily and Imdur increased to 60 mg daily.  It was recommended that he follow-up in 3 months and if continued to have angina would consider repeat cardiac catheterization with FFR of the LAD.  Phone notes after that visit indicate he was instructed to stop simvastatin and start Crestor 10 mg daily along with fenofibrate.   Since that time he has been lost to follow-up.  Presented 9/18 with chest pain/STEMI called in the field with [facility].  Reports he was at work this morning whenever  he began feeling lightheaded and then became hypotensive and had a near syncopal episode.  Denies having had chest pain with this episode.  EMS was called to his work for low blood pressure and near syncope..  On arrival he was noted to have acute ST elevation in anterior septal leads with prolonged QT interval and deep T wave inversions.  Code STEMI was called in the field and he was transported to [facility] for emergent cardiac catheterization.  He was given 324 of aspirin, along with 400 cc normal saline in route.  He was noted to be hypotensive with systolic pressures of 60-70 on arrival.  He was started on norepinephrine while briefly in the ED.  Brought to the Cath Lab emergently for cardiac catheterization.  Upon arrival to the Cath Lab on very low-dose Levophed, his blood pressures were initially 82/57 mmHg -> this improved to the 110s and 120s after increasing Levophed up to 10 mcg/KG/min. interestingly, he was not complaining of any chest discomfort, was mentating well.  Was not diaphoretic and did not have rales on exam.  Plan was to perform diagnostic catheterization and measure LVEDP.  Based on blood pressure evaluation at that time with then determine if additional support was required.  Thought to be borderline cardiogenic shock.  Past Medical History:  Diagnosis Date   CAD in native artery    a. Cath 05/2017 -  90% pRCA (small & nondominant) and 40% pLAD >> medical mangment    Diabetes mellitus without complication (HCC)  Hypertension    Morbid obesity (HCC)    Obstructive sleep apnea     Past Surgical History:  Procedure Laterality Date   LEFT HEART CATH AND CORONARY ANGIOGRAPHY N/A 05/07/2017   Procedure: LEFT HEART CATH AND CORONARY ANGIOGRAPHY;  Surgeon: Swaziland, Peter M, MD;  Location: [facility] INVASIVE CV LAB;  Service: Cardiovascular;  Laterality: N/A;   ULTRASOUND GUIDANCE FOR VASCULAR ACCESS  05/07/2017   Procedure: Ultrasound Guidance For Vascular Access;  Surgeon: Swaziland, Peter M, MD;   Location: [facility] INVASIVE CV LAB;  Service: Cardiovascular;;     Medications Prior to Admission: Prior to Admission medications   Medication Sig Start Date End Date Taking? Authorizing Provider  aspirin EC 81 MG EC tablet Take 1 tablet (81 mg total) by mouth daily. 05/09/17   Filbert Schilder, NP  atenolol (TENORMIN) 25 MG tablet Take 1 tablet (25 mg total) by mouth daily. 09/12/17   Nahser, Deloris Ping, MD  Choline Fenofibrate 135 MG capsule Take 1 capsule (135 mg total) by mouth daily. 09/14/17   Nahser, Deloris Ping, MD  glipiZIDE (GLUCOTROL) 10 MG tablet Take 10 mg by mouth 2 (two) times daily before a meal.     [provider]  hydrochlorothiazide (HYDRODIURIL) 25 MG tablet Take 25 mg by mouth daily.     [provider]  isosorbide mononitrate (IMDUR) 60 MG 24 hr tablet Take 1 tablet (60 mg total) by mouth daily. 09/12/17 09/07/18  Nahser, Deloris Ping, MD  lisinopril (PRINIVIL,ZESTRIL) 5 MG tablet Take 1 tablet (5 mg total) by mouth daily. 05/09/17   Filbert Schilder, NP  metFORMIN (GLUCOPHAGE) 500 MG tablet Take 1,000 mg by mouth 2 (two) times daily with a meal.     [provider]  nitroGLYCERIN (NITROSTAT) 0.4 MG SL tablet Place 1 tablet (0.4 mg total) under the tongue every 5 (five) minutes x 3 doses as needed for chest pain. 05/08/17   Georgie Chard D, NP  rosuvastatin (CRESTOR) 10 MG tablet Take 1 tablet (10 mg total) by mouth daily. 09/14/17   Nahser, Deloris Ping, MD     Allergies:    Allergies  Allergen Reactions   Penicillins Anaphylaxis and Other (See Comments)    Has patient had a PCN reaction causing immediate rash, facial/tongue/throat swelling, SOB or lightheadedness with hypotension: Yes Has patient had a PCN reaction causing severe rash involving mucus membranes or skin necrosis: No Has patient had a PCN reaction that required hospitalization: Unknown Has patient had a PCN reaction occurring within the last 10 years: No If all of the above answers are "NO", then may  proceed with Cephalosporin use.     Social History:   Social History   Socioeconomic History   Marital status: Divorced    Spouse name: Not on file   Number of children: Not on file   Years of education: Not on file   Highest education level: Not on file  Occupational History   Not on file  Tobacco Use   Smoking status: Former    Packs/day: 1.00    Years: 30.00    Pack years: 30.00    Types: Cigarettes    Quit date: 01/02/1997    Years since quitting: 23.7   Smokeless tobacco: Former  Building services engineer Use: Never used  Substance and Sexual Activity   Alcohol use: Yes    Alcohol/week: 2.0 standard drinks    Types: 2 Cans of beer per week   Drug use: Never  Sexual activity: Not on file  Other Topics Concern   Not on file  Social History Narrative   Not on file   Social Determinants of Health   Financial Resource Strain: Not on file  Food Insecurity: Not on file  Transportation Needs: Not on file  Physical Activity: Not on file  Stress: Not on file  Social Connections: Not on file  Intimate Partner Violence: Not on file    Family History:    The patient's family history includes Hypertension in his father.  He is not aware of any premature CAD on either side of the family.  ROS:  Please see the history of present illness.  He says that he had a similar episode of low blood pressures earlier this week, but not to the same extent.  He has not necessarily had any nausea, dizziness except for low blood pressure.  No tachycardia symptoms of rapid irregular heartbeats. No other illness type symptoms, no cough wheezing.  All other ROS reviewed and negative.     Physical Exam/Data:   Vitals:   09/19/20 1028 09/19/20 1029 09/19/20 1042 09/19/20 1117  BP:  (!) 82/61    Pulse:  60    Resp:  (!) 22    Temp: 98.1 F (36.7 C)     TempSrc: Oral     SpO2:  100% 100% 95%   No intake or output data in the 24 hours ending 09/19/20 1118 Last 3 Weights 09/12/2017  05/18/2017 05/08/2017  Weight (lbs) 308 lb 313 lb 303 lb 14.4 oz  Weight (kg) 139.708 kg 141.976 kg 137.848 kg     There is no height or weight on file to calculate BMI.  General: Obese gentleman.  In moderate distress-notably uncomfortable because blood pressures look up, but no chest pain HEENT: normal Neck: Difficult assess JVD, but does not appear to be elevated Vascular: No carotid bruits; Distal pulses 2+ bilaterally   Cardiac: Distant S1, S2; RRR; no murmur rubs or gallops (difficult to hear in the ER) Lungs:  clear to auscultation bilaterally, no wheezing, rhonchi or rales; nonlabored Abd: soft, nontender, no hepatomegaly  Ext: no clubbing or cyanosis, but 2+ left lower leg and 1+ right lower leg edema with varicosities and stasis dermatitis.  Left great toe has bandage from lesion being treated by podiatry Musculoskeletal:  No deformities, BUE and BLE strength normal and equal Skin: warm and dry-surprisingly not diaphoretic Neuro:  CNs 2-12 intact, no focal abnormalities noted Psych:  Normal affect   EKG:  The ECG that was done by EMS was similar to that done in the ER was personally reviewed and demonstrates sinus rhythm with PACs (EKG says A. fib.  Does not appear to be A. fib), nonspecific IVCD with anterior Q waves and biphasic ST elevation with T wave inversions (in the ER, ST elevations less prominent with T wave inversions more prominent) in V1 through V5, and only 2-3 mm ST elevation in V6, also noted to millimeter ST elevations in II, III and aVF with Q waves.  Relevant CV Studies: Cardiac Cath 05/07/2017 (in setting of ACS): Left dominant system with proximal LAD 40% stenosis.  Very small caliber nondominant RCA had 90% stenosis.  Medical management.  Normal EF 55 to 60%.  Normal EDP. TTE 05/07/2017: EF 60 to 65%.  No R WMA.  Mild LA dilation.  Laboratory Data:  High Sensitivity Troponin:  No results for input(s): TROPONINIHS in the last 720 hours.    ChemistryNo results for  input(s): NA, K, CL, CO2, GLUCOSE, BUN, CREATININE, CALCIUM, MG, GFRNONAA, GFRAA, ANIONGAP in the last 168 hours.  No results for input(s): PROT, ALBUMIN, AST, ALT, ALKPHOS, BILITOT in the last 168 hours. Lipids No results for input(s): CHOL, TRIG, HDL, LABVLDL, LDLCALC, CHOLHDL in the last 168 hours. Hematology Recent Labs  Lab 09/19/20 1031  WBC 7.0  RBC 4.11*  HGB 12.4*  HCT 38.8*  MCV 94.4  MCH 30.2  MCHC 32.0  RDW 11.8  PLT 279   Thyroid No results for input(s): TSH, FREET4 in the last 168 hours. BNPNo results for input(s): BNP, PROBNP in the last 168 hours.  DDimer No results for input(s): DDIMER in the last 168 hours.   Radiology/Studies:  No results found.   Assessment and Plan:   Tommy Liu is a 55 y.o. male with CAD (50%pLADm 90% non-dominant pRCA '19), HTN, HL, DM, OSA, obesity who is being seen 09/19/2020 for the evaluation of chest pain/STEMI.  Anterior STEMI/Concern for cardiogenic shock: Actually denies any chest pain, but has experienced lightheadedness and near syncopal episode while at work just prior to arrival.  EKG showed acute ST elevation in anterior leads with prolonged QT interval and T wave inversions.  Given 324 of aspirin in route along with normal saline as he was hypotensive.  On arrival started on norepinephrine drip and brought emergently to the cardiac Cath Lab.  Further recommendations post cath.  Anticipate routine post MI care.  -- check lactic acid & follow up in CVICU -- echo in 2 days -- cycle troponins -- currently on pressor support -we will attempt to wean upon arrival to CVICU  HTN: PTA medications, atenolol 25 mg daily, hydrochlorothiazide 25 mg daily, lisinopril 5 mg daily -- holding all home BP meds with hypotension --Bradycardic in Cath Lab, may not be able to add beta-blocker as of yet.  HLD: on crestor 10mg  daily -> increase Crestor 40 -- check lipids   DM: check Hgb A1c  -- SSI while inpatient -- Likely start  Farxiga tomorrow  Need to determine if he is actually on CPAP, and if necessary start.  Left great toe Wound: has been following with podiatry as an outpatient.   Risk Assessment/Risk Scores:   TIMI Risk Score for ST  Elevation MI:   The patient's TIMI risk score is 5, which indicates a 12.4% risk of all cause mortality at 30 days.{  Severity of Illness: The appropriate patient status for this patient is INPATIENT. Inpatient status is judged to be reasonable and necessary in order to provide the required intensity of service to ensure the patient's safety. The patient's presenting symptoms, physical exam findings, and initial radiographic and laboratory data in the context of their chronic comorbidities is felt to place them at high risk for further clinical deterioration. Furthermore, it is not anticipated that the patient will be medically stable for discharge from the hospital within 2 midnights of admission. The following factors support the patient status of inpatient.   " The patient's presenting symptoms include light-headed, near syncope. " The worrisome physical exam findings include stable exam, overall does not appear in distress " The initial radiographic and laboratory data are worrisome because of EKG with anterior STEMI. " The chronic co-morbidities include HTN, HLD, DM.   * I certify that at the point of admission it is my clinical judgment that the patient will require inpatient hospital care spanning beyond 2 midnights from the point of admission due to high intensity of service,  high risk for further deterioration and high frequency of surveillance required.*   For questions or updates, please contact [facility] Please consult [email or web address] for contact info under     Signed, Laverda Page, NP  09/19/2020 11:18 AM    ATTENDING ATTESTATION  I have seen, examined and evaluated the patient this morning along with Laverda Page, NP.  After reviewing all the  available data and chart, we discussed the patients laboratory, study & physical findings as well as symptoms in detail. I agree with her findings, examination as well as impression recommendations as per our discussion.    Attending adjustments noted in italics.     Bryan Lemma, M.D., M.S. Interventional Cardiologist   Pager # [phone] Phone # [phone] [address] [place], [place] [address]

## 2020-09-19 NOTE — Progress Notes (Signed)
Peripherally Inserted Central Catheter Placement  The IV Nurse has discussed with the patient and/or persons authorized to consent for the patient, the purpose of this procedure and the potential benefits and risks involved with this procedure.  The benefits include less needle sticks, lab draws from the catheter, and the patient may be discharged home with the catheter. Risks include, but not limited to, infection, bleeding, blood clot (thrombus formation), and puncture of an artery; nerve damage and irregular heartbeat and possibility to perform a PICC exchange if needed/ordered by physician.  Alternatives to this procedure were also discussed.  Bard Power PICC patient education guide, fact sheet on infection prevention and patient information card has been provided to patient /or left at bedside.    PICC Placement Documentation  PICC Triple Lumen 09/19/20 PICC Right Brachial 41 cm 0 cm (Active)  Indication for Insertion or Continuance of Line Vasoactive infusions 09/19/20 1702  Exposed Catheter (cm) 0 cm 09/19/20 1702  Site Assessment Clean;Dry;Intact 09/19/20 1702  Lumen #1 Status Flushed;Saline locked;Blood return noted 09/19/20 1702  Lumen #2 Status Flushed;Saline locked;Blood return noted 09/19/20 1702  Lumen #3 Status Flushed;Saline locked;Blood return noted 09/19/20 1702  Dressing Type Transparent 09/19/20 1702  Dressing Status Clean;Intact;Dry 09/19/20 1702  Antimicrobial disc in place? Yes 09/19/20 1702  Safety Lock Not Applicable 09/19/20 1702  Line Care Connections checked and tightened 09/19/20 1702  Line Adjustment (NICU/IV Team Only) No 09/19/20 1702  Dressing Intervention New dressing 09/19/20 1702  Dressing Change Due 09/26/20 09/19/20 1702       Elliot Dally 09/19/2020, 5:03 PM

## 2020-09-20 ENCOUNTER — Other Ambulatory Visit (HOSPITAL_COMMUNITY): Payer: Self-pay

## 2020-09-20 ENCOUNTER — Inpatient Hospital Stay (HOSPITAL_COMMUNITY): Payer: Self-pay

## 2020-09-20 ENCOUNTER — Encounter (HOSPITAL_COMMUNITY): Payer: Self-pay | Admitting: Cardiology

## 2020-09-20 DIAGNOSIS — I2102 ST elevation (STEMI) myocardial infarction involving left anterior descending coronary artery: Secondary | ICD-10-CM

## 2020-09-20 LAB — COOXEMETRY PANEL
Carboxyhemoglobin: 1.4 % (ref 0.5–1.5)
Methemoglobin: 0.9 % (ref 0.0–1.5)
O2 Saturation: 74.2 %
Total hemoglobin: 12.4 g/dL (ref 12.0–16.0)

## 2020-09-20 LAB — BASIC METABOLIC PANEL
Anion gap: 9 (ref 5–15)
BUN: 8 mg/dL (ref 6–20)
CO2: 26 mmol/L (ref 22–32)
Calcium: 8.7 mg/dL — ABNORMAL LOW (ref 8.9–10.3)
Chloride: 95 mmol/L — ABNORMAL LOW (ref 98–111)
Creatinine, Ser: 0.45 mg/dL — ABNORMAL LOW (ref 0.61–1.24)
GFR, Estimated: >60 mL/min (ref >60)
Glucose, Bld: 307 mg/dL — ABNORMAL HIGH (ref 70–99)
Potassium: 3.7 mmol/L (ref 3.5–5.1)
Sodium: 130 mmol/L — ABNORMAL LOW (ref 135–145)

## 2020-09-20 LAB — CBC
HCT: 38.4 % — ABNORMAL LOW (ref 39.0–52.0)
Hemoglobin: 12.5 g/dL — ABNORMAL LOW (ref 13.0–17.0)
MCH: 30.3 pg (ref 26.0–34.0)
MCHC: 32.6 g/dL (ref 30.0–36.0)
MCV: 93.2 fL (ref 80.0–100.0)
Platelets: 259 10*3/uL (ref 150–400)
RBC: 4.12 MIL/uL — ABNORMAL LOW (ref 4.22–5.81)
RDW: 11.9 % (ref 11.5–15.5)
WBC: 7.8 10*3/uL (ref 4.0–10.5)
nRBC: 0 % (ref 0.0–0.2)

## 2020-09-20 LAB — TROPONIN I (HIGH SENSITIVITY): Troponin I (High Sensitivity): 681 ng/L (ref <18)

## 2020-09-20 LAB — POCT I-STAT 7, (LYTES, BLD GAS, ICA,H+H)
Acid-Base Excess: 2 mmol/L (ref 0.0–2.0)
Acid-Base Excess: 4 mmol/L — ABNORMAL HIGH (ref 0.0–2.0)
Bicarbonate: 24.6 mmol/L (ref 20.0–28.0)
Bicarbonate: 27.7 mmol/L (ref 20.0–28.0)
Calcium, Ion: 1.12 mmol/L — ABNORMAL LOW (ref 1.15–1.40)
Calcium, Ion: 1.2 mmol/L (ref 1.15–1.40)
HCT: 35 % — ABNORMAL LOW (ref 39.0–52.0)
HCT: 37 % — ABNORMAL LOW (ref 39.0–52.0)
Hemoglobin: 11.9 g/dL — ABNORMAL LOW (ref 13.0–17.0)
Hemoglobin: 12.6 g/dL — ABNORMAL LOW (ref 13.0–17.0)
O2 Saturation: 100 %
O2 Saturation: 68 %
Potassium: 4.5 mmol/L (ref 3.5–5.1)
Potassium: 4.5 mmol/L (ref 3.5–5.1)
Sodium: 135 mmol/L (ref 135–145)
Sodium: 134 mmol/L — ABNORMAL LOW (ref 135–145)
TCO2: 25 mmol/L (ref 22–32)
TCO2: 29 mmol/L (ref 22–32)
pCO2 arterial: 30.6 mmHg — ABNORMAL LOW (ref 32.0–48.0)
pCO2 arterial: 39.6 mmHg (ref 32.0–48.0)
pH, Arterial: 7.512 — ABNORMAL HIGH (ref 7.350–7.450)
pH, Arterial: 7.453 — ABNORMAL HIGH (ref 7.350–7.450)
pO2, Arterial: 170 mmHg — ABNORMAL HIGH (ref 83.0–108.0)
pO2, Arterial: 33 mmHg — CL (ref 83.0–108.0)

## 2020-09-20 LAB — ECHOCARDIOGRAM COMPLETE
Area-P 1/2: 3.19 cm2
Height: 74.016 in
S' Lateral: 5.2 cm
Weight: 3728.42 oz

## 2020-09-20 LAB — LIPID PANEL
Cholesterol: 171 mg/dL (ref 0–200)
HDL: 32 mg/dL — ABNORMAL LOW (ref >40)
LDL Cholesterol: 105 mg/dL — ABNORMAL HIGH (ref 0–99)
Total CHOL/HDL Ratio: 5.3 RATIO
Triglycerides: 168 mg/dL — ABNORMAL HIGH (ref <150)
VLDL: 34 mg/dL (ref 0–40)

## 2020-09-20 LAB — GLUCOSE, CAPILLARY
Glucose-Capillary: 291 mg/dL — ABNORMAL HIGH (ref 70–99)
Glucose-Capillary: 350 mg/dL — ABNORMAL HIGH (ref 70–99)
Glucose-Capillary: 279 mg/dL — ABNORMAL HIGH (ref 70–99)
Glucose-Capillary: 195 mg/dL — ABNORMAL HIGH (ref 70–99)

## 2020-09-20 LAB — HEMOGLOBIN A1C
Hgb A1c MFr Bld: 11.9 % — ABNORMAL HIGH (ref 4.8–5.6)
Mean Plasma Glucose: 294.83 mg/dL

## 2020-09-20 LAB — POCT ACTIVATED CLOTTING TIME
Activated Clotting Time: 214 seconds
Activated Clotting Time: 341 seconds
Activated Clotting Time: 126 seconds

## 2020-09-20 MED ORDER — FUROSEMIDE 10 MG/ML IJ SOLN
40.0000 mg | Freq: Once | INTRAMUSCULAR | Status: AC
  Administered 2020-09-20: 40 mg via INTRAVENOUS
  Filled 2020-09-20: qty 4

## 2020-09-20 MED ORDER — POTASSIUM CHLORIDE CRYS ER 20 MEQ PO TBCR
40.0000 meq | EXTENDED_RELEASE_TABLET | Freq: Once | ORAL | Status: AC
  Administered 2020-09-20: 40 meq via ORAL
  Filled 2020-09-20: qty 2

## 2020-09-20 MED ORDER — PERFLUTREN LIPID MICROSPHERE
1.0000 mL | INTRAVENOUS | Status: AC | PRN
  Administered 2020-09-20: 2 mL via INTRAVENOUS
  Filled 2020-09-20: qty 10

## 2020-09-20 MED ORDER — EMPAGLIFLOZIN 10 MG PO TABS
10.0000 mg | ORAL_TABLET | Freq: Every day | ORAL | Status: DC
  Administered 2020-09-21 – 2020-09-22 (×2): 10 mg via ORAL
  Filled 2020-09-20 (×2): qty 1

## 2020-09-20 MED ORDER — INSULIN GLARGINE-YFGN 100 UNIT/ML ~~LOC~~ SOLN
15.0000 [IU] | Freq: Every day | SUBCUTANEOUS | Status: DC
  Administered 2020-09-20 – 2020-09-21 (×2): 15 [IU] via SUBCUTANEOUS
  Filled 2020-09-20 (×4): qty 0.15

## 2020-09-20 MED ORDER — SPIRONOLACTONE 12.5 MG HALF TABLET
12.5000 mg | ORAL_TABLET | Freq: Every day | ORAL | Status: DC
  Administered 2020-09-20 – 2020-09-22 (×3): 12.5 mg via ORAL
  Filled 2020-09-20 (×3): qty 1

## 2020-09-20 MED ORDER — DEXTROSE 50 % IV SOLN
INTRAVENOUS | Status: AC
  Filled 2020-09-20: qty 50

## 2020-09-20 NOTE — TOC CM/SW Note (Signed)
HF TOC CM received order to arrange Livevest. Contacted [employer] rep, Alvino Chapel for Life Vest. Isidoro Donning RN3 CCM, Heart Failure TOC CM [phone]

## 2020-09-20 NOTE — TOC Initial Note (Signed)
Transition of Care [facility]) - Initial/Assessment Note    Patient Details  Name: Tommy Liu MRN: [id number] Date of Birth: [birth year]  [facility] CM/SW Contact:    Tommy Cousin, RN Phone Number: [phone] 09/20/2020, 4:37 PM  Clinical Narrative:                 HF TOC CM spoke to pt and states his PCP, Dr Tommy Liu is at the [facility]. He is able to receive his meds through [place] at at discounted rate. Will have attending sent meds to the [facility] pharmacy at time of dc. Discussed with pt [employer] Life Vest. Contacted [employer] rep, Alvino Chapel with new referral. Faxed order, demographics, progress note, and heart cath note. Will process order and schedule for delivery on 09/21/2020.  Expected Discharge Plan: Home/Self Care Barriers to Discharge: Continued Medical Work up   Patient Goals and CMS Choice Patient states their goals for this hospitalization and ongoing recovery are:: feel better CMS Medicare.gov Compare Post Acute Care list provided to:: Patient    Expected Discharge Plan and Services Expected Discharge Plan: Home/Self Care In-house Referral: Clinical Social Work Discharge Planning Services: CM Consult Post Acute Care Choice: Durable Medical Equipment Living arrangements for the past 2 months: Single Family Home                 DME Arranged: Life vest DME Agency: [employer] Date DME Agency Contacted: 09/20/20 Time DME Agency Contacted: [phone]              Prior Living Arrangements/Services Living arrangements for the past 2 months: Single Family Home Lives with:: Self Patient language and need for interpreter reviewed:: Yes Do you feel safe going back to the place where you live?: Yes      Need for Family Participation in Patient Care: No (Comment) Care giver support system in place?: No (comment)   Criminal Activity/Legal Involvement Pertinent to Current Situation/Hospitalization: No - Comment as needed  Activities of Daily Living      Permission  Sought/Granted Permission sought to share information with : Case Manager, Family Supports, PCP Permission granted to share information with : Yes, Verbal Permission Granted              Emotional Assessment   Attitude/Demeanor/Rapport: Gracious, Engaged   Orientation: : Oriented to Self, Oriented to Place, Oriented to  Time, Oriented to Situation   Psych Involvement: No (comment)  Admission diagnosis:  Acute ST elevation myocardial infarction (STEMI) involving left anterior descending coronary artery (HCC) [I21.02] Patient Active Problem List   Diagnosis Date Noted   Acute ST elevation myocardial infarction (STEMI) involving left anterior descending coronary artery (HCC) 09/19/2020   Acute combined systolic and diastolic heart failure (HCC) 09/19/2020   Cardiogenic shock (HCC) - Borderline 09/19/2020   Coronary artery disease involving native coronary artery of native heart with unstable angina pectoris (HCC) 09/19/2020   Presence of drug coated stent in LAD coronary artery 09/19/2020   Hyperlipidemia associated with type 2 diabetes mellitus (HCC) 09/19/2020   DM (diabetes mellitus), type 2 (HCC) 05/08/2017   Morbid obesity (HCC) 05/08/2017   Obstructive sleep apnea 05/08/2017   ACS (acute coronary syndrome) (HCC) 05/06/2017   PCP:  [facility]:   [facility] [phone] - [place], [place] - [address] [place] [address] Phone: [phone] Fax: [phone]  [facility] - [place], [place] - [place]  [address] [place] [address] Phone: [phone] Fax: [phone]     Social Determinants of Health (SDOH) Interventions    Readmission Risk Interventions No flowsheet data found.

## 2020-09-20 NOTE — Progress Notes (Signed)
Pt's oxygen Saturation decreased to below 90% on room air while sleeping, placed on 2L [place]. Will continue to monitor.   Bari Edward, RN

## 2020-09-20 NOTE — Progress Notes (Addendum)
Inpatient Diabetes Program Recommendations  AACE/ADA: New Consensus Statement on Inpatient Glycemic Control (2015)  Target Ranges:  Prepandial:   less than 140 mg/dL      Peak postprandial:   less than 180 mg/dL (1-2 hours)      Critically ill patients:  140 - 180 mg/dL  Results for Tommy Liu, Tommy Liu (MRN [id number]) as of 09/20/2020 07:48  Ref. Range 09/19/2020 10:31  Glucose Latest Ref Range: 70 - 99 mg/dL 494 (H)  Results for Tommy Liu, Tommy Liu (MRN [id number]) as of 09/20/2020 11:58  Ref. Range 09/19/2020 15:46 09/19/2020 21:51 09/20/2020 06:29 09/20/2020 11:12  Glucose-Capillary Latest Ref Range: 70 - 99 mg/dL 338 (H)  11 units Novolog 332 (H)  4 units Novolog 291 (H)  8 units Novolog @0845  350 (H)  R Results for Tommy Liu, Tommy Liu (MRN [id number]) as of 09/20/2020 07:48  Ref. Range 09/20/2020 04:58  Hemoglobin A1C Latest Ref Range: 4.8 - 5.6 % 11.9 (H)  (294 mg/dl)    Admit with: Chest pain/STEMI  History: DM2  Home DM Meds: Glipizide 10 mg BID        Metformin 1000 mg BID        Ozempic 0.5 mg Qweek  Current Orders: Farxiga 10 mg Daily      Novolog Moderate Correction Scale/ SSI (0-15 units) TID AC + HS    PCP: [facility]   MD- Please consider the following:  1. Start Semglee 15 units QHS (0.15 units/kg)  2. Start Novolog Meal Coverage: Novolog 6 units TID with meals  Hold if pt eats <50% of meal, Hold if pt NPO   Likely needs Insulin for home given current A1c is 11.9%   Addendum 12:25pm--Met w/ pt at bedside to discuss current A1c of 11.9%, home diabetes care regimen, current hospital regimen etc.  Pt verified diabetes home med list and told me he just started taking the Ozempic injection last week (1st dose was last week).  Stated he gets his care from the [facility].  Has CBG meter at home but only checking once per day in the AM.  Works a job 5 days one week (12 hr shifts) and 2 days the next week.  Gets his meds from the [facility] mailed to him.  Explained  what an A1c is and what it measures.  Reminded patient that his goal A1c is 7% or less per ADA standards to prevent both acute and long-term complications.  Explained to patient the extreme importance of good glucose control at home.  Encouraged patient to check his CBGs at least bid at home (fasting and another check within the day) and to record all CBGs in a logbook for his PCP to review.  Also discussed with pt the meds we are giving to him here in the hospital.  Discussed w/ pt that Metformin and Glipizide and Ozempic will likely all be on hold until closer time to discharge.  Reviewed Novolog SSi with pt and how we dose this insulin and also discussed w/ pt that the MDs have added Wilder Glade (explained how it works and why they started it).    Pt is open and willing to take insulin at home at time of discharge if needed.  Pt was able to tell me how he uses his Ozempic pen at home and he is using correctly.  Discussed with pt that insulin pens are very similar to Ozempic pens.  The biggest difference being that pt will need to remove the Insulin pen needle  from the insulin pen after injection and that the insulin pens can be used multiple times for multiple doses unlike the one time use of each Ozempic pen.  Reminded pt about the importance of rotation of injection sites as well.      --Will follow patient during hospitalization--  Wyn Quaker RN, MSN, CDE Diabetes Coordinator Inpatient Glycemic Control Team Team Pager: [phone] (8a-5p)

## 2020-09-20 NOTE — Progress Notes (Signed)
Order for [employer]. [employer] Rep notified   Robbie Lis, PA-C

## 2020-09-20 NOTE — Progress Notes (Signed)
  Echocardiogram 2D Echocardiogram has been performed.  Tommy Liu 09/20/2020, 11:24 AM

## 2020-09-20 NOTE — Progress Notes (Addendum)
Patient ID: Tommy Liu, male   DOB: October 16, 1965, 55 y.o.   MRN: [id number]     Advanced Heart Failure Rounding Note  PCP-Cardiologist: Kristeen Miss, MD   Subjective:    No chest pain or dyspnea.  Now off norepinephrine, SBP 90s-110s.  Co-ox 74%, CVP 9-10.     Objective:   Weight Range: 105.7 kg Body mass index is 29.91 kg/m.   Vital Signs:   Temp:  [98.1 F (36.7 C)-98.8 F (37.1 C)] 98.4 F (36.9 C) (09/19 0300) Pulse Rate:  [55-77] 66 (09/19 0600) Resp:  [4-26] 11 (09/19 0600) BP: (82-140)/(56-92) 111/81 (09/19 0600) SpO2:  [91 %-100 %] 97 % (09/19 0600) Arterial Line BP: (95-129)/(53-73) 118/64 (09/18 2100) Weight:  [105.7 kg] 105.7 kg (09/18 1300) Last BM Date:  (PTA)  Weight change: Filed Weights   09/19/20 1300  Weight: 105.7 kg    Intake/Output:   Intake/Output Summary (Last 24 hours) at 09/20/2020 0811 Last data filed at 09/20/2020 0600 Gross per 24 hour  Intake 1612.27 ml  Output 1975 ml  Net -362.73 ml      Physical Exam    General:  Well appearing. No resp difficulty HEENT: Normal Neck: Supple. JVP 8-9 cm. Carotids 2+ bilat; no bruits. No lymphadenopathy or thyromegaly appreciated. Cor: PMI nondisplaced. Regular rate & rhythm. No rubs, gallops or murmurs. Lungs: Clear Abdomen: Soft, nontender, nondistended. No hepatosplenomegaly. No bruits or masses. Good bowel sounds. Extremities: No cyanosis, clubbing, rash, edema Neuro: Alert & orientedx3, cranial nerves grossly intact. moves all 4 extremities w/o difficulty. Affect pleasant   Telemetry   NSR 70s (personally reviewed)   Labs    CBC Recent Labs    09/19/20 1031 09/20/20 0458  WBC 7.0 7.8  NEUTROABS 4.0  --   HGB 12.4* 12.5*  HCT 38.8* 38.4*  MCV 94.4 93.2  PLT 279 259   Basic Metabolic Panel Recent Labs    20/35/59 1740 09/20/20 0458  NA 135 130*  K 2.7* 3.7  CL 105 95*  CO2 23 26  GLUCOSE 317* 307*  BUN 6 8  CREATININE 0.37* 0.45*  CALCIUM 7.2* 8.7*  MG 1.4*   --    Liver Function Tests Recent Labs    09/19/20 1031  AST 11*  ALT 17  ALKPHOS 93  BILITOT 0.6  PROT 6.4*  ALBUMIN 3.0*   No results for input(s): LIPASE, AMYLASE in the last 72 hours. Cardiac Enzymes No results for input(s): CKTOTAL, CKMB, CKMBINDEX, TROPONINI in the last 72 hours.  BNP: BNP (last 3 results) No results for input(s): BNP in the last 8760 hours.  ProBNP (last 3 results) No results for input(s): PROBNP in the last 8760 hours.   D-Dimer No results for input(s): DDIMER in the last 72 hours. Hemoglobin A1C Recent Labs    09/20/20 0458  HGBA1C 11.9*   Fasting Lipid Panel Recent Labs    09/20/20 0458  CHOL 171  HDL 32*  LDLCALC 105*  TRIG 168*  CHOLHDL 5.3   Thyroid Function Tests No results for input(s): TSH, T4TOTAL, T3FREE, THYROIDAB in the last 72 hours.  Invalid input(s): FREET3  Other results:   Imaging    CARDIAC CATHETERIZATION  Result Date: 09/19/2020   Culprit Lesion: Mid LAD lesion is 90% stenosed.   A drug-eluting stent was successfully placed using a STENT ONYX FRONTIER 3.0X18 -> postdilated to 3.3 mm   Post intervention, there is a 0% residual stenosis.   Mid LAD to Dist LAD lesion is 50%  stenosed with 40% stenosed side branch in 3rd Diag.   Dist LAD lesion is 100% stenosed -> likely distal embolization from initial MI.   1st Mrg lesion is 100% stenosed.  Appears to be CTO (will reassess with interventional colleagues.)   Prox RCA lesion is 90% stenosed.   ------------------------------------------   There is severe left ventricular systolic dysfunction.  The left ventricular ejection fraction is 25-35% by visual estimate.  With anterior wall abnormalities consistent with LAD infarct   LV end diastolic pressure is moderately elevated -opening LVEDP 23 mmHg, but post PCI PCWP was 16 mmHg .   There is mild (2+) mitral regurgitation. SUMMARY Left Dominant System: Severe single-vessel disease with 90% thrombotic stenosis of the mid LAD  just after 2nd Diag, with diffuse 40 to 50% disease in the mid vessel distally. ->  Likely downstream embolization from the original event with occlusion of the apical LAD noted. 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  (Could consider attempted PCI of this vessel prior to discharge) Severely reduced EF roughly 25% with anterior wall hypokinesis and apical akinesis.  LVEDP elevated at 23 mmHg in the setting of hypotension (systolic pressures 82/57) --> ACUTE COMBINED SYSTOLIC AND DIASTOLIC HEART FAILURE Borderline Cardiogenic Shock initially requiring vasopressors, blood pressures improved with revascularization.  Weaning off pressors.  (Did not meet criteria for Impella support) RECOMMENDATIONS Admit to CVICU.  Have discussed with advanced heart failure team-Dr. Shirlee Latch would like for [place] to place a PICC line once venous sheath is removed in order to adequately titrate off the Levophed. Will check serial lactate levels and cycle troponins. Need to be assess EF by echo prior to discharge, but would not check post cath day 1 in order to allow time for recuperation. Review cath images with interventional colleagues to determine if it is feasible to attempt PCI of the OM prior to discharge. Bryan Lemma, MD  DG Chest Port 1 View  Result Date: 09/19/2020 CLINICAL DATA:  Chest pain EXAM: PORTABLE CHEST 1 VIEW COMPARISON:  January 23, 2019 FINDINGS: The heart size and mediastinal contours are within normal limits. Both lungs are clear. The visualized skeletal structures are unremarkable. IMPRESSION: No active disease. Electronically Signed   By: Gerome Sam III M.D.   On: 09/19/2020 15:03   [place] EKG SITE RITE  Result Date: 09/19/2020 If Site Rite image not attached, placement could not be confirmed due to current cardiac rhythm.    Medications:     Scheduled Medications:  aspirin  81  mg Oral Daily   Chlorhexidine Gluconate Cloth  6 each Topical Daily   dapagliflozin propanediol  10 mg Oral Daily   furosemide  40 mg Intravenous Once   heparin  5,000 Units Subcutaneous Q8H   insulin aspart  0-15 Units Subcutaneous TID WC   insulin aspart  0-5 Units Subcutaneous QHS   mouth rinse  15 mL Mouth Rinse BID   potassium chloride  40 mEq Oral Once   rosuvastatin  40 mg Oral Daily   sodium chloride flush  10-40 mL Intracatheter Q12H   sodium chloride flush  3 mL Intravenous Q12H   spironolactone  12.5 mg Oral Daily   ticagrelor  90 mg Oral BID    Infusions:  sodium chloride     sodium chloride     sodium chloride 10 mL/hr at 09/20/20 0600  norepinephrine (LEVOPHED) Adult infusion Stopped (09/19/20 2154)    PRN Medications: sodium chloride, acetaminophen, ondansetron (ZOFRAN) IV, sodium chloride flush, sodium chloride flush    Assessment/Plan   1. CAD: History of unstable angina 5/19, medically managed (90% stenosis nondominant RCA).  Admitted with acute anterior MI.  Cath with 90% thrombotic stenosis mid LAD, suspected embolic occlusion distal LAD, CTO OM1 with collaterals, 90% nondominant RCA.  Patient treated with DES to mid LAD.  No chest pain.  BP improved post-procedure.  - Continue ticagrelor and ASA 81.  - Crestor 40 mg daily.  - Will review OM1 with Dr Herbie Baltimore, with CTO with collaterals, would probably manage medically.  2. Acute systolic CHF/early cardiogenic shock: LV-gram with EF 25%, ischemic cardiomyopathy.  Patient was hypotensive pre-PCI, but post-PCI BP improved.  Now weaned off NE, SBP 90s-110.  Co-ox 74%, CVP 8-9 this morning.  With persistent ST elevation on ECG, I worry about LV apical aneurysm (occluded distal LAD) formation and risk of LV thrombus.  Careful with GDMT given soft BP.  - Formal echo today - On dapagliflozin 10 mg daily.  - Add spironolactone 12.5 daily.  - Lasix 40 mg IV x 1 today.  - Probably will need Lifevest at discharge.  3.  Diabetes: SSI, check A1c.  4. OSA: Strong suspicion.  Needs sleep study.   Mobilize, out of bed, can go to step down.   Length of Stay: 1  Marca Ancona, MD  09/20/2020, 8:11 AM  Advanced Heart Failure Team Pager [phone] (M-F; 7a - 5p)  Please contact [facility] for night-coverage after hours (5p -7a ) and weekends on [email or web address]

## 2020-09-20 NOTE — Progress Notes (Signed)
CARDIAC REHAB PHASE I   PRE:  Rate/Rhythm: 65 SR    BP: sitting 116/83    SaO2: 100 RA  MODE:  Ambulation: 190 ft   POST:  Rate/Rhythm: 74 SR    BP: sitting 127/89     SaO2: 100 RA  Pt to EOB then able to ambulate. No c/o walking but did have slight dizziness after walk. VSS. Gave MI book. Attempted to set up education video however the system is done (called IT). Will f/u tomorrow. [id number]   Harriet Masson CES, ACSM 09/20/2020 1:49 PM

## 2020-09-20 NOTE — Progress Notes (Addendum)
HF CSW reached out to [employer] about screening patient for Medicaid as patient doesn't have health insurance and his hospital account doesn't reflect that a screen has been done. [employer] and [employer] indicated Tommy Liu is on their list to screen for Medicaid.   Elzie Sheets, MSW, LCSWA [phone] Heart Failure Social Worker

## 2020-09-21 ENCOUNTER — Encounter (HOSPITAL_COMMUNITY): Payer: Self-pay | Admitting: Cardiology

## 2020-09-21 ENCOUNTER — Other Ambulatory Visit: Payer: Self-pay

## 2020-09-21 LAB — GLUCOSE, CAPILLARY
Glucose-Capillary: 376 mg/dL — ABNORMAL HIGH (ref 70–99)
Glucose-Capillary: 305 mg/dL — ABNORMAL HIGH (ref 70–99)
Glucose-Capillary: 161 mg/dL — ABNORMAL HIGH (ref 70–99)
Glucose-Capillary: 180 mg/dL — ABNORMAL HIGH (ref 70–99)

## 2020-09-21 LAB — BASIC METABOLIC PANEL
Anion gap: 9 (ref 5–15)
BUN: 10 mg/dL (ref 6–20)
CO2: 26 mmol/L (ref 22–32)
Calcium: 9 mg/dL (ref 8.9–10.3)
Chloride: 96 mmol/L — ABNORMAL LOW (ref 98–111)
Creatinine, Ser: 0.44 mg/dL — ABNORMAL LOW (ref 0.61–1.24)
GFR, Estimated: >60 mL/min (ref >60)
Glucose, Bld: 299 mg/dL — ABNORMAL HIGH (ref 70–99)
Potassium: 3.9 mmol/L (ref 3.5–5.1)
Sodium: 131 mmol/L — ABNORMAL LOW (ref 135–145)

## 2020-09-21 LAB — POCT I-STAT 7, (LYTES, BLD GAS, ICA,H+H)
Acid-Base Excess: 3 mmol/L — ABNORMAL HIGH (ref 0.0–2.0)
Bicarbonate: 27.5 mmol/L (ref 20.0–28.0)
Calcium, Ion: 1.19 mmol/L (ref 1.15–1.40)
HCT: 37 % — ABNORMAL LOW (ref 39.0–52.0)
Hemoglobin: 12.6 g/dL — ABNORMAL LOW (ref 13.0–17.0)
O2 Saturation: 66 %
Potassium: 4.6 mmol/L (ref 3.5–5.1)
Sodium: 135 mmol/L (ref 135–145)
TCO2: 29 mmol/L (ref 22–32)
pCO2 arterial: 39.3 mmHg (ref 32.0–48.0)
pH, Arterial: 7.454 — ABNORMAL HIGH (ref 7.350–7.450)
pO2, Arterial: 32 mmHg — CL (ref 83.0–108.0)

## 2020-09-21 LAB — COOXEMETRY PANEL
Carboxyhemoglobin: 1.3 % (ref 0.5–1.5)
Methemoglobin: 0.9 % (ref 0.0–1.5)
O2 Saturation: 74.6 %
Total hemoglobin: 13.9 g/dL (ref 12.0–16.0)

## 2020-09-21 MED ORDER — SACUBITRIL-VALSARTAN 24-26 MG PO TABS
1.0000 | ORAL_TABLET | Freq: Two times a day (BID) | ORAL | Status: DC
  Administered 2020-09-21 – 2020-09-22 (×3): 1 via ORAL
  Filled 2020-09-21 (×3): qty 1

## 2020-09-21 MED ORDER — INSULIN ASPART 100 UNIT/ML IJ SOLN
6.0000 [IU] | Freq: Three times a day (TID) | INTRAMUSCULAR | Status: DC
  Administered 2020-09-21 – 2020-09-22 (×3): 6 [IU] via SUBCUTANEOUS

## 2020-09-21 NOTE — Progress Notes (Signed)
Patient ID: Tommy Liu, male   DOB: 1965-10-18, 55 y.o.   MRN: [id number]     Advanced Heart Failure Rounding Note  PCP-Cardiologist: Kristeen Miss, MD   Subjective:    No chest pain or dyspnea.  SBP 110s this morning.  CVP 6.   Echo: EF 25-30%, LAD wall motion abnormalities, no LV thrombus, normal RV.      Objective:   Weight Range: 105.7 kg Body mass index is 29.91 kg/m.   Vital Signs:   Temp:  [97.8 F (36.6 C)-98.7 F (37.1 C)] 98.7 F (37.1 C) (09/20 0742) Pulse Rate:  [61-79] 78 (09/20 0746) Resp:  [13-22] 20 (09/20 0746) BP: (112-129)/(71-93) 116/73 (09/20 0746) SpO2:  [93 %-100 %] 95 % (09/20 0746) Last BM Date: 09/20/20  Weight change: Filed Weights   09/19/20 1300  Weight: 105.7 kg    Intake/Output:   Intake/Output Summary (Last 24 hours) at 09/21/2020 0808 Last data filed at 09/21/2020 0742 Gross per 24 hour  Intake 518.19 ml  Output 2825 ml  Net -2306.81 ml      Physical Exam    General: NAD Neck: No JVD, no thyromegaly or thyroid nodule.  Lungs: Clear to auscultation bilaterally with normal respiratory effort. CV: Nondisplaced PMI.  Heart regular S1/S2, no S3/S4, no murmur.  No peripheral edema.   Abdomen: Soft, nontender, no hepatosplenomegaly, no distention.  Skin: Intact without lesions or rashes.  Neurologic: Alert and oriented x 3.  Psych: Normal affect. Extremities: No clubbing or cyanosis.  HEENT: Normal.    Telemetry   NSR 70s (personally reviewed)   Labs    CBC Recent Labs    09/19/20 1031 09/19/20 1121 09/19/20 1201 09/20/20 0458  WBC 7.0  --   --  7.8  NEUTROABS 4.0  --   --   --   HGB 12.4*   < > 12.6* 12.5*  HCT 38.8*   < > 37.0* 38.4*  MCV 94.4  --   --  93.2  PLT 279  --   --  259   < > = values in this interval not displayed.   Basic Metabolic Panel Recent Labs    58/52/77 1740 09/20/20 0458 09/21/20 0340  NA 135 130* 131*  K 2.7* 3.7 3.9  CL 105 95* 96*  CO2 23 26 26   GLUCOSE 317* 307* 299*   BUN 6 8 10   CREATININE 0.37* 0.45* 0.44*  CALCIUM 7.2* 8.7* 9.0  MG 1.4*  --   --    Liver Function Tests Recent Labs    09/19/20 1031  AST 11*  ALT 17  ALKPHOS 93  BILITOT 0.6  PROT 6.4*  ALBUMIN 3.0*   No results for input(s): LIPASE, AMYLASE in the last 72 hours. Cardiac Enzymes No results for input(s): CKTOTAL, CKMB, CKMBINDEX, TROPONINI in the last 72 hours.  BNP: BNP (last 3 results) No results for input(s): BNP in the last 8760 hours.  ProBNP (last 3 results) No results for input(s): PROBNP in the last 8760 hours.   D-Dimer No results for input(s): DDIMER in the last 72 hours. Hemoglobin A1C Recent Labs    09/20/20 0458  HGBA1C 11.9*   Fasting Lipid Panel Recent Labs    09/20/20 0458  CHOL 171  HDL 32*  LDLCALC 105*  TRIG 168*  CHOLHDL 5.3   Thyroid Function Tests No results for input(s): TSH, T4TOTAL, T3FREE, THYROIDAB in the last 72 hours.  Invalid input(s): FREET3  Other results:   Imaging  ECHOCARDIOGRAM COMPLETE  Result Date: 09/20/2020    ECHOCARDIOGRAM REPORT   Patient Name:   Tommy Liu Date of Exam: 09/20/2020 Medical Rec #:  [id number]          Height:       74.0 in Accession #:    [id number]         Weight:       233.0 lb Date of Birth:  26-Mar-1965          BSA:          2.319 m Patient Age:    55 years           BP:           123/90 mmHg Patient Gender: M                  HR:           64 bpm. Exam Location:  Inpatient Procedure: 2D Echo, Cardiac Doppler, Color Doppler and Intracardiac            Opacification Agent Indications:    Acute MI  History:        Patient has prior history of Echocardiogram examinations, most                 recent 05/07/2017. Cardiomyopathy, CAD and Previous Myocardial                 Infarction; Risk Factors:Hypertension, Diabetes, Sleep Apnea and                 Former Smoker.  Sonographer:    Lavenia Atlas RDCS Referring Phys: [id number] Savina Olshefski S Sirr Kabel IMPRESSIONS  1. Left ventricular ejection  fraction, by estimation, is 25 to 30%. The left ventricle has severely decreased function. The left ventricle demonstrates regional wall motion abnormalities with basal to mid anterior hypokinesis, apical anterior akinesis, mid to apical anteroseptal and inferoseptal akinesis, apical inferior akinesis, apical lateral severe hypokinesis, akinesis of the true apex. Consistent with large LAD territory infarction. No LV thrombus visualized. The left ventricular internal cavity size was mildly dilated. There is mild left ventricular hypertrophy. Left ventricular diastolic parameters are consistent with Grade I diastolic dysfunction (impaired relaxation).  2. Right ventricular systolic function is normal. The right ventricular size is normal. There is normal pulmonary artery systolic pressure. The estimated right ventricular systolic pressure is 29.8 mmHg.  3. The mitral valve is normal in structure. No evidence of mitral valve regurgitation. No evidence of mitral stenosis.  4. The aortic valve is tricuspid. Aortic valve regurgitation is trivial. Mild aortic valve sclerosis is present, with no evidence of aortic valve stenosis.  5. Aortic dilatation noted. There is mild dilatation of the aortic root, measuring 37 mm.  6. The inferior vena cava is normal in size with greater than 50% respiratory variability, suggesting right atrial pressure of 3 mmHg. FINDINGS  Left Ventricle: Left ventricular ejection fraction, by estimation, is 25 to 30%. The left ventricle has severely decreased function. The left ventricle demonstrates regional wall motion abnormalities. Definity contrast agent was given IV to delineate the left ventricular endocardial borders. The left ventricular internal cavity size was mildly dilated. There is mild left ventricular hypertrophy. Left ventricular diastolic parameters are consistent with Grade I diastolic dysfunction (impaired relaxation). Right Ventricle: The right ventricular size is normal. No  increase in right ventricular wall thickness. Right ventricular systolic function is normal. There is normal pulmonary artery systolic pressure. The tricuspid regurgitant velocity is 2.59  m/s, and  with an assumed right atrial pressure of 3 mmHg, the estimated right ventricular systolic pressure is 29.8 mmHg. Left Atrium: Left atrial size was normal in size. Right Atrium: Right atrial size was normal in size. Pericardium: Trivial pericardial effusion is present. Mitral Valve: The mitral valve is normal in structure. There is mild calcification of the mitral valve leaflet(s). Mild mitral annular calcification. No evidence of mitral valve regurgitation. No evidence of mitral valve stenosis. Tricuspid Valve: The tricuspid valve is normal in structure. Tricuspid valve regurgitation is trivial. Aortic Valve: The aortic valve is tricuspid. Aortic valve regurgitation is trivial. Mild aortic valve sclerosis is present, with no evidence of aortic valve stenosis. Pulmonic Valve: The pulmonic valve was normal in structure. Pulmonic valve regurgitation is not visualized. Aorta: Aortic dilatation noted. There is mild dilatation of the aortic root, measuring 37 mm. Venous: The inferior vena cava is normal in size with greater than 50% respiratory variability, suggesting right atrial pressure of 3 mmHg. IAS/Shunts: No atrial level shunt detected by color flow Doppler.  LEFT VENTRICLE PLAX 2D LVIDd:         6.20 cm  Diastology LVIDs:         5.20 cm  LV e' medial:    4.90 cm/s LV PW:         1.20 cm  LV E/e' medial:  11.6 LV IVS:        1.10 cm  LV e' lateral:   4.79 cm/s LVOT diam:     2.50 cm  LV E/e' lateral: 11.9 LV SV:         104 LV SV Index:   45 LVOT Area:     4.91 cm  RIGHT VENTRICLE RV Basal diam:  3.60 cm RV S prime:     12.30 cm/s TAPSE (M-mode): 2.9 cm LEFT ATRIUM             Index       RIGHT ATRIUM           Index LA diam:        4.20 cm 1.81 cm/m  RA Area:     12.20 cm LA Vol (A2C):   54.9 ml 23.67 ml/m RA  Volume:   26.20 ml  11.30 ml/m LA Vol (A4C):   72.4 ml 31.22 ml/m LA Biplane Vol: 64.9 ml 27.98 ml/m  AORTIC VALVE LVOT Vmax:   97.50 cm/s LVOT Vmean:  70.200 cm/s LVOT VTI:    0.212 m  AORTA Ao Root diam: 3.70 cm MITRAL VALVE               TRICUSPID VALVE MV Area (PHT): 3.19 cm    TR Peak grad:   26.8 mmHg MV Decel Time: 238 msec    TR Vmax:        259.00 cm/s MV E velocity: 56.80 cm/s MV A velocity: 56.30 cm/s  SHUNTS MV E/A ratio:  1.01        Systemic VTI:  0.21 m                            Systemic Diam: 2.50 cm Muath Hallam McleanMD Electronically signed by Wilfred Lacy Signature Date/Time: 09/20/2020/3:18:48 PM    Final      Medications:     Scheduled Medications:  aspirin  81 mg Oral Daily   Chlorhexidine Gluconate Cloth  6 each Topical Daily   empagliflozin  10 mg Oral Daily  heparin  5,000 Units Subcutaneous Q8H   insulin aspart  0-15 Units Subcutaneous TID WC   insulin aspart  0-5 Units Subcutaneous QHS   insulin glargine-yfgn  15 Units Subcutaneous QHS   mouth rinse  15 mL Mouth Rinse BID   rosuvastatin  40 mg Oral Daily   sacubitril-valsartan  1 tablet Oral BID   sodium chloride flush  10-40 mL Intracatheter Q12H   sodium chloride flush  3 mL Intravenous Q12H   spironolactone  12.5 mg Oral Daily   ticagrelor  90 mg Oral BID    Infusions:  sodium chloride     sodium chloride     sodium chloride 10 mL/hr at 09/20/20 1000   norepinephrine (LEVOPHED) Adult infusion Stopped (09/19/20 2154)    PRN Medications: sodium chloride, acetaminophen, ondansetron (ZOFRAN) IV, sodium chloride flush, sodium chloride flush    Assessment/Plan   1. CAD: History of unstable angina 5/19, medically managed (90% stenosis nondominant RCA).  Admitted with acute anterior MI.  Cath with 90% thrombotic stenosis mid LAD, suspected embolic occlusion distal LAD, CTO OM1 with collaterals, 90% nondominant RCA.  Patient treated with DES to mid LAD.  No chest pain.  BP improved post-procedure.  -  Continue ticagrelor and ASA 81.  - Crestor 40 mg daily.   2. Acute systolic CHF/early cardiogenic shock: LV-gram with EF 25%, ischemic cardiomyopathy.  Patient was hypotensive pre-PCI, but post-PCI BP improved.  Echo showed EF 25-30%, LAD wall motion abnormalities, no LV thrombus, normal RV. Co-ox 75%, CVP 6 this morning. No dyspnea.  - On empagliflozin 10 mg daily.  - Continue spironolactone 12.5 daily.  - Add Entresto 24/26 bid.  - If BP stable, low dose Coreg tomorrow.  - Will try to arrange Lifevest at discharge.  3. Diabetes: Adjusting per diabetes coordinator.  4. OSA: Strong suspicion.  Needs sleep study.   Walk in hall today, hopefully home tomorrow.   Will need help with meds, followed at [facility] in [place].   Length of Stay: 2  Marca Ancona, MD  09/21/2020, 8:08 AM  Advanced Heart Failure Team Pager [phone] (M-F; 7a - 5p)  Please contact [facility] for night-coverage after hours (5p -7a ) and weekends on [email or web address]

## 2020-09-21 NOTE — Progress Notes (Signed)
Pt just back from walking, sts he felt well, 470 ft. To recliner. Discussed MI, restrictions, diet, lifevest, and CRPII. Pt voiced understanding but will need further reiteration, especially with diet. He has lost 130 lbs in about a year, he is unsure how. Will refer to [facility] and f/u tomorrow for more education. Pt to read materials and watch videos.  [id number] Ethelda Chick CES, ACSM 10:26 AM 09/21/2020

## 2020-09-21 NOTE — Discharge Summary (Addendum)
Advanced Heart Failure Team  Discharge Summary   Patient ID: Tommy Liu MRN: [id number], DOB/AGE: 1965/10/24 55 y.o. Admit date: 09/19/2020 D/C date:     09/22/2020   Primary Discharge Diagnoses:  Anterior STEMI Acute systolic CHF Cardiogenic shock Diabetes OSA HTN HLD  Hospital Course:  Tommy Liu is a 55 year old male with history of CAD, HTN, HLD, DM, OSA obesity. He was admitted with unstable angina in 2019 and found to have 50% LAD and 90% proximal RCA (nondominant) treated medically. He had been lost to follow-up since 2019.   Transferred from [place] to [facility] on 09/18 with anterior STEMI and cardiogenic shock. Had presyncopal episode at work prior to presentation. He was hypotensive on arrival and started on NE which was able to be weaned off later that night. Coronary angiogram demonstrated 90% thrombotic stenosis in mid LAD treated with PCI/DES, suspected embolic disease causing occlusion in distal LAD, chronic OM1 occlusion with collaterals, 90% proximal non-dominant RCA. RHC showed mean RA 9, PA 26/11, mean PCWP 16 with LVEDP 23, CI 2.46. Echo 09/19 with LVEF of 25-30%, WMA consistent with large LAD territory infarct, no LV thrombus, RV okay, RVSP 30 mmHg, no MR.  Diuresed with IV lasix. Initiated on GDMT with entresto, empagliflozin, and spironolactone. BP too soft for beta blocker. LifeVest placed prior to discharge.   See below for detailed hospital course by problem    Hospital Course by Problem:  1. CAD/anterior STEMI: History of unstable angina 5/19, medically managed (90% stenosis nondominant RCA).  Admitted 09/18 with acute anterior MI.  Cath with 90% thrombotic stenosis mid LAD, suspected embolic occlusion distal LAD, CTO OM1 with collaterals, 90% nondominant RCA.  Patient treated with PCI/DES to mid LAD.  No chest pain.  Initially hypotensive. BP improved post-procedure.  - Continue DAPT with 81 mg aspirin and ticagrelor  - Crestor 40 mg daily.   2. Acute systolic  CHF/early cardiogenic shock: LV-gram with EF 25%, ischemic cardiomyopathy.  Patient was hypotensive pre-PCI, but post-PCI BP improved.  Weaned off NE. Echo showed EF 25-30%, LAD wall motion abnormalities, no LV thrombus, normal RV. Co-ox 75%, CVP 5 this morning. No dyspnea.  - Continue empagliflozin 10 mg daily.  - Continue spironolactone 12.5 daily.  - Continue Entresto 24/26 bid.  - BP too soft for Coreg. Can add as outpatient  - LifeVest placed  - Continue to titrate GDMT as tolerated at outpatient follow-up.  Repeat echo in about 3 months. If LVEF remains < or = 35%, will need to consider AICD placement 3. Diabetes: Uncontrolled. A1c 11.9. Home doses of jardiance, glipizide, metformin and ozempic restarted at discharge. Diabetes coordinator assisted with management while inpatient. May need insulin. [facility]. 4. OSA: Strong suspicion.  Nocturnal desaturations. Needs sleep study as outpatient. 5. HTN: Actually hypotensive on admit. Improved prior to discharge and able to tolerate GDMT 6. HLD: LDL 105. Continue 40 mg Rosuvastatin daily. On 10 mg daily PTA.    1 month supply of medications to be sent to [facility] at D/C with 30 day free co-pay cards. Discussed with pharmD. Primarily receives care through [place].   Discharge Weight Range: 220 lb  Discharge Vitals: Blood pressure 102/68, pulse 94, temperature 98.9 F (37.2 C), temperature source Oral, resp. rate 18, height 6' 2.02" (1.88 m), weight 100 kg, SpO2 99 %.  Labs: Lab Results  Component Value Date   WBC 7.8 09/20/2020   HGB 12.5 (L) 09/20/2020  HCT 38.4 (L) 09/20/2020   MCV 93.2 09/20/2020   PLT 259 09/20/2020    Recent Labs  Lab 09/19/20 1031 09/19/20 1121 09/22/20 0500  NA 134*   < > 135  K 5.4*   < > 3.9  CL 97*   < > 100  CO2 28   < > 22  BUN 8   < > 12  CREATININE 0.70   < > 0.63  CALCIUM 9.0   < > 9.3  PROT 6.4*  --   --   BILITOT 0.6  --   --   ALKPHOS 93  --   --   ALT 17   --   --   AST 11*  --   --   GLUCOSE 494*   < > 166*   < > = values in this interval not displayed.   Lab Results  Component Value Date   CHOL 171 09/20/2020   HDL 32 (L) 09/20/2020   LDLCALC 105 (H) 09/20/2020   TRIG 168 (H) 09/20/2020   BNP (last 3 results) No results for input(s): BNP in the last 8760 hours.  ProBNP (last 3 results) No results for input(s): PROBNP in the last 8760 hours.   Diagnostic Studies/Procedures   No results found.  Discharge Medications   Allergies as of 09/22/2020       Reactions   Penicillins Anaphylaxis, Other (See Comments)   Has patient had a PCN reaction causing immediate rash, facial/tongue/throat swelling, SOB or lightheadedness with hypotension: Yes Has patient had a PCN reaction causing severe rash involving mucus membranes or skin necrosis: No Has patient had a PCN reaction that required hospitalization: Unknown Has patient had a PCN reaction occurring within the last 10 years: No If all of the above answers are "NO", then may proceed with Cephalosporin use.        Medication List     STOP taking these medications    amLODipine 10 MG tablet Commonly known as: NORVASC   atenolol 25 MG tablet Commonly known as: TENORMIN   atorvastatin 80 MG tablet Commonly known as: LIPITOR   Choline Fenofibrate 135 MG capsule   hydrochlorothiazide 25 MG tablet Commonly known as: HYDRODIURIL   isosorbide mononitrate 60 MG 24 hr tablet Commonly known as: IMDUR   lisinopril 5 MG tablet Commonly known as: ZESTRIL       TAKE these medications    acetaminophen 325 MG tablet Commonly known as: TYLENOL Take 650 mg by mouth every 6 (six) hours as needed for moderate pain.   aspirin 81 MG EC tablet Take 1 tablet (81 mg total) by mouth daily.   clotrimazole 1 % cream Commonly known as: LOTRIMIN Apply 1 application topically 2 (two) times daily as needed (toe pain).   doxycycline 100 MG capsule Commonly known as: MONODOX Take 1  capsule by mouth 2 (two) times daily.   empagliflozin 25 MG Tabs tablet Commonly known as: JARDIANCE Take 12.5 mg by mouth daily. What changed: Another medication with the same name was added. Make sure you understand how and when to take each.   empagliflozin 10 MG Tabs tablet Commonly known as: JARDIANCE Take 1 tablet (10 mg total) by mouth daily. What changed: You were already taking a medication with the same name, and this prescription was added. Make sure you understand how and when to take each.   glipiZIDE 10 MG tablet Commonly known as: GLUCOTROL Take 10 mg by mouth 2 (two) times daily before a meal.  metFORMIN 500 MG tablet Commonly known as: GLUCOPHAGE Take 1,000 mg by mouth 2 (two) times daily with a meal.   nitroGLYCERIN 0.4 MG SL tablet Commonly known as: NITROSTAT Place 1 tablet (0.4 mg total) under the tongue every 5 (five) minutes x 3 doses as needed for chest pain.   rosuvastatin 40 MG tablet Commonly known as: CRESTOR Take 1 tablet (40 mg total) by mouth daily. What changed:  medication strength how much to take   sacubitril-valsartan 24-26 MG Commonly known as: ENTRESTO Take 1 tablet by mouth 2 (two) times daily.   Semaglutide(0.25 or 0.5MG /DOS) 2 MG/1.5ML Sopn Inject 0.5 mg as directed once a week.   spironolactone 25 MG tablet Commonly known as: ALDACTONE Take 0.5 tablets (12.5 mg total) by mouth daily.   tamsulosin 0.4 MG Caps capsule Commonly known as: FLOMAX Take 1 capsule by mouth daily.   ticagrelor 90 MG Tabs tablet Commonly known as: BRILINTA Take 1 tablet (90 mg total) by mouth 2 (two) times daily.               Durable Medical Equipment  (From admission, onward)           Start     Ordered   09/20/20 1517  For home use only DME Vest life vest  Once       Comments: S/p anterior MI. LVEF<35%. Duration 3 months   09/20/20 1516            Disposition   The patient will be discharged in stable condition to  home. Discharge Instructions     Amb Referral to Cardiac Rehabilitation   Complete by: As directed    To [place]   Diagnosis:  Coronary Stents PTCA STEMI     After initial evaluation and assessments completed: Virtual Based Care may be provided alone or in conjunction with Phase 2 Cardiac Rehab based on patient barriers.: Yes       Follow-up Information     [facility] Follow up on 09/27/2020.   Specialty: Cardiology Why: Advanced Heart Failure Clinic at [facility] at 3:30 pm Entrance C, Garage Code [id number] Contact information: [address] [id number] Wilhemina Bonito [place] [address] [phone]                  Duration of Discharge Encounter: Greater than 35 minutes   Signed, Robbie Lis, PA-C  09/22/2020, 11:11 AM   Patient ready for discharge today.  Home on the above meds and Lifevest.  Needs close followup in CHF clinic.   Marca Ancona 09/22/2020

## 2020-09-21 NOTE — TOC Progression Note (Signed)
Transition of Care [facility]) - Progression Note    Patient Details  Name: Tommy Liu MRN: [id number] Date of Birth: Jan 09, 1965  Transition of Care [facility]) CM/SW Contact  Vetra Shinall, LCSWA Phone Number: 09/21/2020, 11:59 AM  Clinical Narrative:    HF CSW and RNCM spoke with the patient at bedside and completed a very brief SDOH with the patient who denied having any needs at this time. Mr. Clapper reported that he is working full-time and has transportation and doesn't have a problem getting food or paying his bills. Mr. Boehlke reported living alone and that he doesn't have health insurance and that he cannot enroll with his employer until the first of the year. Patient reported he does have a PCP through the [place] and he can get to the pharmacy to pick up his medications which has also been through the [place]. CSW provided Mr. Alemu with an appointment card for the [facility] outpatient clinic and encouraged him to follow up and to attend the appointment and bring his medications and if anything changes to please reach out so that CSW/HV clinic team can provide support. Mr. Niblack reported that he has transportation home at discharge and has a few question about the Lifevest. Lifevest representative to speak with patient before discharge.   Expected Discharge Plan: Home/Self Care Barriers to Discharge: Continued Medical Work up  Expected Discharge Plan and Services Expected Discharge Plan: Home/Self Care In-house Referral: Clinical Social Work Discharge Planning Services: CM Consult Post Acute Care Choice: Durable Medical Equipment Living arrangements for the past 2 months: Single Family Home                 DME Arranged: Life vest DME Agency: [employer] Date DME Agency Contacted: 09/20/20 Time DME Agency Contacted: [phone]               Social Determinants of Health (SDOH) Interventions Food Insecurity Interventions: Intervention Not Indicated Financial Strain Interventions: Freight forwarder Housing Interventions: Intervention Not Indicated Transportation Interventions: Intervention Not Indicated  Readmission Risk Interventions No flowsheet data found.  Autumne Kallio, MSW, LCSWA [phone] Heart Failure Social Worker

## 2020-09-21 NOTE — Progress Notes (Signed)
Inpatient Diabetes Program Recommendations  AACE/ADA: New Consensus Statement on Inpatient Glycemic Control (2015)  Target Ranges:  Prepandial:   less than 140 mg/dL      Peak postprandial:   less than 180 mg/dL (1-2 hours)      Critically ill patients:  140 - 180 mg/dL    Results for Tommy Liu, Tommy Liu (MRN [id number]) as of 09/21/2020 10:05  Ref. Range 09/20/2020 06:29 09/20/2020 11:12 09/20/2020 16:58 09/20/2020 21:06 09/21/2020 07:27  Glucose-Capillary Latest Ref Range: 70 - 99 mg/dL 195 (H) 093 (H) 267 (H) 195 (H) 376 (H)   Admit with: Chest pain/STEMI  History: DM2  Home DM Meds: Glipizide 10 mg BID        Metformin 1000 mg BID        Ozempic 0.5 mg Qweek  Current Orders: Jardiance 10 mg Daily      Novolog 0-15 units TID AC + HS      Semglee 15 units qhs   PCP: [facility] A1c 11.9% on 9/19   MD- Please consider the following:  -   Increase Semglee 25 units QHS  Likely needs Insulin for home given current A1c is 11.9% Diabetes Coordinator educated pt on 9/19  --Will follow patient during hospitalization--  Christena Deem RN, MSN, BC-ADM Inpatient Diabetes Coordinator Team Pager [phone] (8a-5p)

## 2020-09-21 NOTE — TOC CM/SW Note (Signed)
HF TOC CM spoke to [facility], [place]. Pt was approved for Life Vest. Scheduled delivery today. Spoke to pt at bedside. States he works full time and he will be able to sign up for insurance at next enrollment. Isidoro Donning RN3 CCM, Heart Failure TOC CM [phone]

## 2020-09-21 NOTE — Consult Note (Signed)
WOC Nurse Consult Note: Reason for Consult:left great toe with full thickness wound Wound type: trauma vs neuropathic Pressure Injury POA: N/A Measurement:2cm x 1cm x 0.1cm Wound bed:dry, brown Drainage (amount, consistency, odor) none Periwound:intact Dressing procedure/placement/frequency:I will provide conservative care orders for the care of this chronic wound using a NS cleanse and application of an antimicrobial nonadherent, xeroform. This is to be topped with a dry gauze and secured with conform bandaging/paper tape. This will be changed daily.  WOC nursing team will not follow, but will remain available to this patient, the nursing and medical teams.  Please re-consult if needed. Thanks, Ladona Mow, MSN, RN, GNP, [place]  Pager# [phone]

## 2020-09-22 ENCOUNTER — Other Ambulatory Visit (HOSPITAL_COMMUNITY): Payer: Self-pay

## 2020-09-22 LAB — COOXEMETRY PANEL
Carboxyhemoglobin: 1.3 % (ref 0.5–1.5)
Methemoglobin: 0.8 % (ref 0.0–1.5)
O2 Saturation: 72.9 %
Total hemoglobin: 15.6 g/dL (ref 12.0–16.0)

## 2020-09-22 LAB — BASIC METABOLIC PANEL
Anion gap: 13 (ref 5–15)
BUN: 12 mg/dL (ref 6–20)
CO2: 22 mmol/L (ref 22–32)
Calcium: 9.3 mg/dL (ref 8.9–10.3)
Chloride: 100 mmol/L (ref 98–111)
Creatinine, Ser: 0.63 mg/dL (ref 0.61–1.24)
GFR, Estimated: >60 mL/min (ref >60)
Glucose, Bld: 166 mg/dL — ABNORMAL HIGH (ref 70–99)
Potassium: 3.9 mmol/L (ref 3.5–5.1)
Sodium: 135 mmol/L (ref 135–145)

## 2020-09-22 LAB — GLUCOSE, CAPILLARY: Glucose-Capillary: 230 mg/dL — ABNORMAL HIGH (ref 70–99)

## 2020-09-22 MED ORDER — SPIRONOLACTONE 25 MG PO TABS
12.5000 mg | ORAL_TABLET | Freq: Every day | ORAL | 1 refills | Status: DC
  Filled 2020-09-22: qty 15, 30d supply, fill #0

## 2020-09-22 MED ORDER — EMPAGLIFLOZIN 10 MG PO TABS
10.0000 mg | ORAL_TABLET | Freq: Every day | ORAL | 1 refills | Status: DC
  Filled 2020-09-22: qty 30, 30d supply, fill #0

## 2020-09-22 MED ORDER — ASPIRIN 81 MG PO TBEC: 81.0000 mg | DELAYED_RELEASE_TABLET | Freq: Every day | ORAL | 0 refills | Status: DC

## 2020-09-22 MED ORDER — SACUBITRIL-VALSARTAN 24-26 MG PO TABS
1.0000 | ORAL_TABLET | Freq: Two times a day (BID) | ORAL | 1 refills | Status: DC
  Filled 2020-09-22: qty 60, 30d supply, fill #0

## 2020-09-22 MED ORDER — ROSUVASTATIN CALCIUM 40 MG PO TABS
40.0000 mg | ORAL_TABLET | Freq: Every day | ORAL | 1 refills | Status: DC
  Filled 2020-09-22: qty 30, 30d supply, fill #0

## 2020-09-22 MED ORDER — TICAGRELOR 90 MG PO TABS
90.0000 mg | ORAL_TABLET | Freq: Two times a day (BID) | ORAL | 1 refills | Status: DC
  Filled 2020-09-22: qty 60, 30d supply, fill #0

## 2020-09-22 NOTE — Progress Notes (Signed)
CARDIAC REHAB PHASE I   PRE:  Rate/Rhythm: 97 SR    BP: sitting 107/74    SaO2: 100 RA  MODE:  Ambulation: 470 ft   POST:  Rate/Rhythm: 97 SR    BP: sitting 102/68     SaO2: 98 RA  Slow pace, no increase in HR. BP stable, low. No major c/o. Reviewed ed that we discussed yesterday. Did not remember sodium content for diet. Discussed exercise, NTG, and Brilinta.  Pt voiced understanding but would benefit from reiteration. I set up HF video. He wants to watch more videos before d/c. Sts he will read materials when he gets home. Gave HF booklet and discussed.  [id number]  Harriet Masson CES, ACSM 09/22/2020 11:10 AM

## 2020-09-22 NOTE — Progress Notes (Addendum)
Patient ID: Tommy Liu, male   DOB: April 19, 1965, 55 y.o.   MRN: [id number]     Advanced Heart Failure Rounding Note  PCP-Cardiologist: Kristeen Miss, MD   Subjective:    Feels well today no complaints. BP a bit soft, systolic's low 100s-110s w/ Entresto but tolerating ok. No orthostatic symptoms.   CVP 5. Co-ox 73%.   Denies dyspnea. No CP. NSR on tele.   LifeVest delivered.    Objective:   Weight Range: 100 kg Body mass index is 28.3 kg/m.   Vital Signs:   Temp:  [98.1 F (36.7 C)-99.1 F (37.3 C)] 98.9 F (37.2 C) (09/21 0624) Pulse Rate:  [64-94] 94 (09/21 0624) Resp:  [18-22] 18 (09/21 0624) BP: (96-121)/(68-86) 115/86 (09/21 0624) SpO2:  [92 %-98 %] 95 % (09/21 0624) Weight:  [100 kg] 100 kg (09/21 0624) Last BM Date: 09/21/20  Weight change: Filed Weights   09/19/20 1300 09/21/20 0900 09/22/20 0624  Weight: 105.7 kg 102.2 kg 100 kg    Intake/Output:   Intake/Output Summary (Last 24 hours) at 09/22/2020 0959 Last data filed at 09/22/2020 4098 Gross per 24 hour  Intake 840 ml  Output 2345 ml  Net -1505 ml      Physical Exam    CVP 5  General:  Well appearing. No respiratory difficulty HEENT: normal Neck: supple. no JVD. Carotids 2+ bilat; no bruits. No lymphadenopathy or thyromegaly appreciated. Cor: PMI nondisplaced. Regular rate & rhythm. No rubs, gallops or murmurs. Lungs: clear Abdomen: soft, nontender, nondistended. No hepatosplenomegaly. No bruits or masses. Good bowel sounds. Extremities: no cyanosis, clubbing, rash, edema Neuro: alert & oriented x 3, cranial nerves grossly intact. moves all 4 extremities w/o difficulty. Affect pleasant.   Telemetry   NSR 70s (personally reviewed)   Labs    CBC Recent Labs    09/19/20 1031 09/19/20 1121 09/19/20 1201 09/20/20 0458  WBC 7.0  --   --  7.8  NEUTROABS 4.0  --   --   --   HGB 12.4*   < > 12.6*  12.6* 12.5*  HCT 38.8*   < > 37.0*  37.0* 38.4*  MCV 94.4  --   --  93.2  PLT  279  --   --  259   < > = values in this interval not displayed.   Basic Metabolic Panel Recent Labs    11/91/47 1740 09/20/20 0458 09/21/20 0340 09/22/20 0500  NA 135   < > 131* 135  K 2.7*   < > 3.9 3.9  CL 105   < > 96* 100  CO2 23   < > 26 22  GLUCOSE 317*   < > 299* 166*  BUN 6   < > 10 12  CREATININE 0.37*   < > 0.44* 0.63  CALCIUM 7.2*   < > 9.0 9.3  MG 1.4*  --   --   --    < > = values in this interval not displayed.   Liver Function Tests Recent Labs    09/19/20 1031  AST 11*  ALT 17  ALKPHOS 93  BILITOT 0.6  PROT 6.4*  ALBUMIN 3.0*   No results for input(s): LIPASE, AMYLASE in the last 72 hours. Cardiac Enzymes No results for input(s): CKTOTAL, CKMB, CKMBINDEX, TROPONINI in the last 72 hours.  BNP: BNP (last 3 results) No results for input(s): BNP in the last 8760 hours.  ProBNP (last 3 results) No results for input(s): PROBNP in the last  8760 hours.   D-Dimer No results for input(s): DDIMER in the last 72 hours. Hemoglobin A1C Recent Labs    09/20/20 0458  HGBA1C 11.9*   Fasting Lipid Panel Recent Labs    09/20/20 0458  CHOL 171  HDL 32*  LDLCALC 105*  TRIG 168*  CHOLHDL 5.3   Thyroid Function Tests No results for input(s): TSH, T4TOTAL, T3FREE, THYROIDAB in the last 72 hours.  Invalid input(s): FREET3  Other results:   Imaging    No results found.   Medications:     Scheduled Medications:  aspirin  81 mg Oral Daily   Chlorhexidine Gluconate Cloth  6 each Topical Daily   empagliflozin  10 mg Oral Daily   heparin  5,000 Units Subcutaneous Q8H   insulin aspart  0-15 Units Subcutaneous TID WC   insulin aspart  0-5 Units Subcutaneous QHS   insulin aspart  6 Units Subcutaneous TID WC   insulin glargine-yfgn  15 Units Subcutaneous QHS   mouth rinse  15 mL Mouth Rinse BID   rosuvastatin  40 mg Oral Daily   sacubitril-valsartan  1 tablet Oral BID   sodium chloride flush  10-40 mL Intracatheter Q12H   sodium chloride flush   3 mL Intravenous Q12H   spironolactone  12.5 mg Oral Daily   ticagrelor  90 mg Oral BID    Infusions:  sodium chloride     sodium chloride     sodium chloride 10 mL/hr at 09/20/20 1000   norepinephrine (LEVOPHED) Adult infusion Stopped (09/19/20 2154)    PRN Medications: sodium chloride, acetaminophen, ondansetron (ZOFRAN) IV, sodium chloride flush, sodium chloride flush    Assessment/Plan   1. CAD: History of unstable angina 5/19, medically managed (90% stenosis nondominant RCA).  Admitted with acute anterior MI.  Cath with 90% thrombotic stenosis mid LAD, suspected embolic occlusion distal LAD, CTO OM1 with collaterals, 90% nondominant RCA.  Patient treated with DES to mid LAD.  No chest pain.  BP improved post-procedure.  - Continue ticagrelor and ASA 81.  - Crestor 40 mg daily.   2. Acute systolic CHF/early cardiogenic shock: LV-gram with EF 25%, ischemic cardiomyopathy.  Patient was hypotensive pre-PCI, but post-PCI BP improved.  Echo showed EF 25-30%, LAD wall motion abnormalities, no LV thrombus, normal RV. Co-ox 73%, CVP 5 this morning. Denies dyspnea.  - On empagliflozin 10 mg daily.  - Continue spironolactone 12.5 daily.  - Continue Entresto 24/26 bid.  - BP too soft for Coreg.  - D/c home w/ LifeVest. Will continue gradual med titration as outpatient until fully optimized.  - Will need repeat echo in 3 months. ICD if EF <35% 3. Diabetes: will need [facility] at [facility] 4. OSA: Strong suspicion.  Needs sleep study. Will arrange outpatient   D/c Home today. Post hospital f/u arranged.   Length of Stay: [address], PA-C  09/22/2020, 9:59 AM  Advanced Heart Failure Team Pager [phone] (M-F; 7a - 5p)  Please contact [facility] for night-coverage after hours (5p -7a ) and weekends on [email or web address]  Agree with the above PA note.    Ok for home today.    Meds for home: ASA 81, ticagrelor 90 bid, Crestor 40, spironolactone 12.5, Entresto 24/26 bid, empagliflozin 10  daily.  BP too soft for Coreg.  Will need Lifevest.  Will arrange for close followup in CHF clinic.   Marca Ancona 09/22/2020

## 2020-09-24 ENCOUNTER — Telehealth (HOSPITAL_COMMUNITY): Payer: Self-pay

## 2020-09-24 NOTE — Telephone Encounter (Signed)
Per phase I cardiac rehab, fax cardiac rehab referral to [place] [facility]. 

## 2020-09-26 NOTE — Progress Notes (Addendum)
Advanced Heart Failure Clinic Note    [facility] PCP-Cardiologist: Kristeen Miss, MD  HF Cardiologist: Dr. Shirlee Latch  HPI: Tommy Liu is a 55 y.o. with history of CAD, HTN, type 2 diabetes, OSA but not on CPAP presented today with acute anterior MI.  Patient was last admitted in 5/19 with unstable angina.  Cath showed 90% proximal stenosis of nondominant RCA and 40% stenosis proximal LAD.  Echo showed EF 60-65%.  He was managed medically.  He was lost to followup as an outpatient.    Transferred from [place] to [facility] on 09/19/20 with anterior STEMI and cardiogenic shock. He was hypotensive on arrival and started on NE. Coronary angiogram demonstrated 90% thrombotic stenosis in mid LAD treated with PCI/DES, suspected embolic disease causing occlusion in distal LAD, chronic OM1 occlusion with collaterals, 90% proximal non-dominant RCA. RHC showed mean RA 9, PA 26/11, mean PCWP 16 with LVEDP 23, CI 2.46. Echo 09/19 with LVEF of 25-30%, WMA consistent with large LAD territory infarct, no LV thrombus, RV okay, RVSP 30 mmHg, no MR.  Diuresed with IV lasix. Initiated on GDMT with Entresto, empagliflozin, and spironolactone. BP too soft for beta blocker. LifeVest placed prior to discharge; discharge weight 220 lbs.  Today he presents to establish post hospital HF care, here with his ex-wife. He is not significantly dyspneic with activity, but feels he is limited by fatigue. Denies CP, dizziness, abnormal bleeding, edema, or PND/Orthopnea. Appetite poor. He does not weigh regularly at home but he does have a scale. Taking all medications. Blood sugars 170-190. He gets his primary care at the [place]. 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 Wearing LifeVest, no issues.  Review of Systems: [y] = yes, [ ]  = no   General: Weight gain [ ] ; Weight loss [ ] ; Anorexia [ y]; Fatigue [ y]; Fever [ ] ; Chills [ ] ; Weakness [ ]   Cardiac: Chest  pain/pressure [ ] ; Resting SOB [ ] ; Exertional SOB [ ] ; Orthopnea [ ] ; Pedal Edema [ ] ; Palpitations [ ] ; Syncope [ ] ; Presyncope [ ] ; Paroxysmal nocturnal dyspnea[ ]   Pulmonary: Cough [ ] ; Wheezing[ ] ; Hemoptysis[ ] ; Sputum [ ] ; Snoring [ ]   GI: Vomiting[ ] ; Dysphagia[ ] ; Melena[ ] ; Hematochezia [ ] ; Heartburn[ ] ; Abdominal pain [ ] ; Constipation [ ] ; Diarrhea [ ] ; BRBPR [ ]   GU: Hematuria[ ] ; Dysuria [ ] ; Nocturia[ ]   Vascular: Pain in legs with walking [ ] ; Pain in feet with lying flat [ ] ; Non-healing sores [ ] ; Stroke [ ] ; TIA [ ] ; Slurred speech [ ] ;  Neuro: Headaches[ ] ; Vertigo[ ] ; Seizures[ ] ; Paresthesias[ ] ;Blurred vision [ ] ; Diplopia [ ] ; Vision changes [ ]   Ortho/Skin: Arthritis [ ] ; Joint pain [ ] ; Muscle pain [ ] ; Joint swelling [ ] ; Back Pain [ ] ; Rash [ ]   Psych: Depression[ ] ; Anxiety[ ]   Heme: Bleeding problems [ ] ; Clotting disorders [ ] ; Anemia [ ]   Endocrine: Diabetes ]; Thyroid dysfunction[ ]    Past Medical History:  Diagnosis Date   CAD in native artery    a. Cath 05/2017 -  90% pRCA (small & nondominant) and 40% pLAD >> medical mangment    Diabetes mellitus without complication (HCC)    Hypertension    Morbid obesity (HCC)    Obstructive sleep apnea    Current Outpatient Medications  Medication Sig Dispense Refill   acetaminophen (TYLENOL) 325 MG tablet Take 650 mg by  mouth every 6 (six) hours as needed for moderate pain.     aspirin 81 MG EC tablet Take 1 tablet (81 mg total) by mouth daily. 30 tablet 0   clotrimazole (LOTRIMIN) 1 % cream Apply 1 application topically 2 (two) times daily as needed (toe pain).     doxycycline (MONODOX) 100 MG capsule Take 1 capsule by mouth 2 (two) times daily.     empagliflozin (JARDIANCE) 25 MG TABS tablet Take 12.5 mg by mouth daily.     glipiZIDE (GLUCOTROL) 10 MG tablet Take 10 mg by mouth 2 (two) times daily before a meal.      metFORMIN (GLUCOPHAGE) 500 MG tablet Take 1,000 mg by mouth 2 (two) times daily with a meal.       nitroGLYCERIN (NITROSTAT) 0.4 MG SL tablet Place 1 tablet (0.4 mg total) under the tongue every 5 (five) minutes x 3 doses as needed for chest pain. 25 tablet 0   rosuvastatin (CRESTOR) 40 MG tablet Take 1 tablet (40 mg total) by mouth daily. 30 tablet 1   sacubitril-valsartan (ENTRESTO) 24-26 MG Take 1 tablet by mouth 2 (two) times daily. 60 tablet 1   Semaglutide,0.25 or 0.5MG /DOS, 2 MG/1.5ML SOPN Inject 0.5 mg as directed once a week.     spironolactone (ALDACTONE) 25 MG tablet Take 1/2 tablet (12.5 mg total) by mouth daily. 30 tablet 1   tamsulosin (FLOMAX) 0.4 MG CAPS capsule Take 1 capsule by mouth daily.     ticagrelor (BRILINTA) 90 MG TABS tablet Take 1 tablet (90 mg total) by mouth 2 (two) times daily. 60 tablet 1   No current facility-administered medications for this encounter.   Allergies  Allergen Reactions   Penicillins Anaphylaxis and Other (See Comments)    Has patient had a PCN reaction causing immediate rash, facial/tongue/throat swelling, SOB or lightheadedness with hypotension: Yes Has patient had a PCN reaction causing severe rash involving mucus membranes or skin necrosis: No Has patient had a PCN reaction that required hospitalization: Unknown Has patient had a PCN reaction occurring within the last 10 years: No If all of the above answers are "NO", then may proceed with Cephalosporin use.    Social History   Socioeconomic History   Marital status: Divorced    Spouse name: Not on file   Number of children: Not on file   Years of education: Not on file   Highest education level: Not on file  Occupational History   Not on file  Tobacco Use   Smoking status: Former    Packs/day: 1.00    Years: 30.00    Pack years: 30.00    Types: Cigarettes    Quit date: 01/02/1997    Years since quitting: 23.7   Smokeless tobacco: Former  Building services engineer Use: Never used  Substance and Sexual Activity   Alcohol use: Yes    Alcohol/week: 2.0 standard drinks     Types: 2 Cans of beer per week   Drug use: Never   Sexual activity: Not on file  Other Topics Concern   Not on file  Social History Narrative   Lives alone.  Still has support of ex-wife and other relatives   Social Determinants of Health   Financial Resource Strain: Medium Risk   Difficulty of Paying Living Expenses: Somewhat hard  Food Insecurity: No Food Insecurity   Worried About Programme researcher, broadcasting/film/video in the Last Year: Never true   Ran Out of Food in the Last Year:  Never true  Transportation Needs: No Transportation Needs   Lack of Transportation (Medical): No   Lack of Transportation (Non-Medical): No  Physical Activity: Not on file  Stress: Not on file  Social Connections: Not on file  Intimate Partner Violence: Not on file   Family History  Problem Relation Age of Onset   Hypertension Father    BP 98/68   Pulse 74   Wt 107.6 kg (237 lb 3.2 oz)   SpO2 98%   BMI 30.44 kg/m   Wt Readings from Last 3 Encounters:  09/27/20 107.6 kg (237 lb 3.2 oz)  09/22/20 100 kg (220 lb 8 oz)  09/12/17 (!) 139.7 kg (308 lb)   PHYSICAL EXAM: General:  NAD. No resp difficulty HEENT: Normal Neck: Supple. No JVD. Carotids 2+ bilat; no bruits. No lymphadenopathy or thryomegaly appreciated. Cor: PMI nondisplaced. Regular rate & rhythm. No rubs, gallops or murmurs. Lungs: Clear, LifeVest in place Abdomen: Obese, nontender, nondistended. No hepatosplenomegaly. No bruits or masses. Good bowel sounds. Extremities: No cyanosis, clubbing, rash, edema Neuro: Alert & oriented x 3, cranial nerves grossly intact. Moves all 4 extremities w/o difficulty. Affect pleasant.  ECG: SR 75 bpm (personally reviewed)  LifeVest: No downloads yet, but no issues per patient.  ASSESSMENT & PLAN: 1. CAD: History of unstable angina 5/19, medically managed (90% stenosis nondominant RCA).  Admitted with acute anterior MI.  Cath with 90% thrombotic stenosis mid LAD, suspected embolic occlusion distal LAD, CTO OM1  with collaterals, 90% nondominant RCA.  Patient treated with DES to mid LAD.  No chest pain.  - Continue ticagrelor and ASA 81.  - Crestor 40 mg daily.   2. Chronic systolic CHF/early cardiogenic shock: LV-gram with EF 25%, ischemic cardiomyopathy.  Patient was hypotensive pre-PCI, but post-PCI BP improved.  Echo showed EF 25-30%, LAD wall motion abnormalities, no LV thrombus, normal RV. NYHA II-early III, he is not volume overloaded today. - BP too soft for beta blocker today. - Continue empagliflozin 25 mg daily. No GU symptoms, watch with uncontrolled DM. - Continue spironolactone 12.5 daily.  - Continue Entresto 24/26 mg bid.  - Continue spiro 12.5 mg daily. - Continue LifeVest. Will continue gradual med titration until fully optimized.  - Will need repeat echo in 3 months. ICD if EF <35%. - He has been referred to CR. 3. Diabetes: will need f/u at [facility]. - A1c 11.9 (9/22). 4. OSA: Strong suspicion.  Needs sleep study but has no insurance. He plans to enroll in next open enrollment. Will defer until then.  Follow up in 3 weeks with PharmD (consider adding low-dose carvedilol) and in 6 weeks with APP.  Anderson Malta Dunlap, FNP 09/27/20

## 2020-09-27 ENCOUNTER — Encounter (HOSPITAL_COMMUNITY): Payer: Self-pay

## 2020-09-27 ENCOUNTER — Ambulatory Visit (HOSPITAL_COMMUNITY)
Admission: RE | Admit: 2020-09-27 | Discharge: 2020-09-27 | Disposition: A | Discharge status: Home / Self Care | Payer: Self-pay | Source: Ambulatory Visit | Attending: Family Medicine | Admitting: Family Medicine

## 2020-09-27 ENCOUNTER — Other Ambulatory Visit: Payer: Self-pay

## 2020-09-27 VITALS — BP 98/68 | HR 74 | Wt 237.2 lb

## 2020-09-27 DIAGNOSIS — I110 Hypertensive heart disease with heart failure: Secondary | ICD-10-CM | POA: Insufficient documentation

## 2020-09-27 DIAGNOSIS — Z8249 Family history of ischemic heart disease and other diseases of the circulatory system: Secondary | ICD-10-CM | POA: Insufficient documentation

## 2020-09-27 DIAGNOSIS — Z87891 Personal history of nicotine dependence: Secondary | ICD-10-CM | POA: Insufficient documentation

## 2020-09-27 DIAGNOSIS — Z880 Allergy status to penicillin: Secondary | ICD-10-CM | POA: Insufficient documentation

## 2020-09-27 DIAGNOSIS — Z7902 Long term (current) use of antithrombotics/antiplatelets: Secondary | ICD-10-CM | POA: Insufficient documentation

## 2020-09-27 DIAGNOSIS — Z79899 Other long term (current) drug therapy: Secondary | ICD-10-CM | POA: Insufficient documentation

## 2020-09-27 DIAGNOSIS — G4733 Obstructive sleep apnea (adult) (pediatric): Secondary | ICD-10-CM | POA: Insufficient documentation

## 2020-09-27 DIAGNOSIS — I255 Ischemic cardiomyopathy: Secondary | ICD-10-CM | POA: Insufficient documentation

## 2020-09-27 DIAGNOSIS — Z7984 Long term (current) use of oral hypoglycemic drugs: Secondary | ICD-10-CM | POA: Insufficient documentation

## 2020-09-27 DIAGNOSIS — E1159 Type 2 diabetes mellitus with other circulatory complications: Secondary | ICD-10-CM

## 2020-09-27 DIAGNOSIS — Z596 Low income: Secondary | ICD-10-CM | POA: Insufficient documentation

## 2020-09-27 DIAGNOSIS — Z955 Presence of coronary angioplasty implant and graft: Secondary | ICD-10-CM | POA: Insufficient documentation

## 2020-09-27 DIAGNOSIS — I5022 Chronic systolic (congestive) heart failure: Secondary | ICD-10-CM | POA: Insufficient documentation

## 2020-09-27 DIAGNOSIS — Z7982 Long term (current) use of aspirin: Secondary | ICD-10-CM | POA: Insufficient documentation

## 2020-09-27 DIAGNOSIS — I2510 Atherosclerotic heart disease of native coronary artery without angina pectoris: Secondary | ICD-10-CM | POA: Insufficient documentation

## 2020-09-27 DIAGNOSIS — E119 Type 2 diabetes mellitus without complications: Secondary | ICD-10-CM | POA: Insufficient documentation

## 2020-09-27 DIAGNOSIS — I214 Non-ST elevation (NSTEMI) myocardial infarction: Secondary | ICD-10-CM | POA: Insufficient documentation

## 2020-09-27 NOTE — Progress Notes (Signed)
CSW consulted to discuss problems with a truck payment with the pt.  Pt has notice regarding payment being reduced if he is on disability- does not need help with payment immediately just trying to understand this paperwork- CSW directed him to call the agency that provides this and inquire with them to clarify paperwork.  Provided fax number to the clinic so they can send records request to verify disability if needed.  Also brought in disability placard application- CSW and physician completed medical portion and gave back to pt to submit.  No further needs at this time  Burna Sis, LCSW Clinical Social Worker Advanced Heart Failure Clinic Desk#: [phone] Cell#: [phone]

## 2020-09-27 NOTE — Patient Instructions (Signed)
Labs were done today, if any labs are abnormal the clinic will call you  Your physician recommends that you schedule a follow-up appointment in: 3 weeks with pharmacy  6 weeks in clinic and then 3-4 months with echocardiogram   At the Advanced Heart Failure Clinic, you and your health needs are our priority. As part of our continuing mission to provide you with exceptional heart care, we have created designated Provider Care Teams. These Care Teams include your primary Cardiologist (physician) and Advanced Practice Providers (APPs- Physician Assistants and Nurse Practitioners) who all work together to provide you with the care you need, when you need it.   You may see any of the following providers on your designated Care Team at your next follow up: Dr Arvilla Meres Dr Marca Ancona Dr Brandon Melnick, NP Robbie Lis, [place] Mikki Santee Karle Plumber, PharmD   Please be sure to bring in all your medications bottles to every appointment.    If you have any questions or concerns before your next appointment please send [place] a message through [place] or call our office at [phone].    TO LEAVE A MESSAGE FOR THE NURSE SELECT OPTION 2, PLEASE LEAVE A MESSAGE INCLUDING: YOUR NAME DATE OF BIRTH CALL BACK NUMBER REASON FOR CALL**this is important as we prioritize the call backs  YOU WILL RECEIVE A CALL BACK THE SAME DAY AS LONG AS YOU CALL BEFORE 4:00 PM

## 2020-09-28 ENCOUNTER — Telehealth (HOSPITAL_COMMUNITY): Payer: Self-pay | Admitting: Surgery

## 2020-09-28 DIAGNOSIS — I5022 Chronic systolic (congestive) heart failure: Secondary | ICD-10-CM

## 2020-09-28 LAB — BASIC METABOLIC PANEL
Anion gap: 12 (ref 5–15)
BUN: 5 mg/dL — ABNORMAL LOW (ref 6–20)
CO2: 24 mmol/L (ref 22–32)
Calcium: 8.9 mg/dL (ref 8.9–10.3)
Chloride: 103 mmol/L (ref 98–111)
Creatinine, Ser: 0.5 mg/dL — ABNORMAL LOW (ref 0.61–1.24)
GFR, Estimated: >60 mL/min (ref >60)
Glucose, Bld: 69 mg/dL — ABNORMAL LOW (ref 70–99)
Potassium: 3.3 mmol/L — ABNORMAL LOW (ref 3.5–5.1)
Sodium: 139 mmol/L (ref 135–145)

## 2020-09-28 MED ORDER — POTASSIUM CHLORIDE CRYS ER 20 MEQ PO TBCR: 20.0000 meq | EXTENDED_RELEASE_TABLET | Freq: Every day | ORAL | 3 refills | Status: DC

## 2020-09-28 NOTE — Telephone Encounter (Signed)
I spoke with Tommy Liu regarding his decreased Potassium level from blood work. I also discussed starting Potassium per [name] NP, sent that prescription to pharmacy and scheduled a follow-up appt in 10 days.  He is aware and agreeable to plan.

## 2020-09-28 NOTE — Telephone Encounter (Signed)
-----   Message from Jacklynn Ganong, [place] sent at 09/28/2020 12:58 PM EDT ----- Potassium low. Please start 20 mEq KCl daily. Repeat BMET in 10 days.

## 2020-10-01 ENCOUNTER — Other Ambulatory Visit (HOSPITAL_COMMUNITY): Payer: Self-pay

## 2020-10-07 ENCOUNTER — Other Ambulatory Visit (HOSPITAL_COMMUNITY): Payer: Self-pay

## 2020-10-08 ENCOUNTER — Other Ambulatory Visit: Payer: Self-pay

## 2020-10-08 ENCOUNTER — Ambulatory Visit (HOSPITAL_COMMUNITY)
Admission: RE | Admit: 2020-10-08 | Discharge: 2020-10-08 | Disposition: A | Discharge status: Home / Self Care | Payer: Self-pay | Source: Ambulatory Visit | Attending: Internal Medicine | Admitting: Internal Medicine

## 2020-10-08 DIAGNOSIS — I5022 Chronic systolic (congestive) heart failure: Secondary | ICD-10-CM | POA: Insufficient documentation

## 2020-10-08 LAB — BASIC METABOLIC PANEL
Anion gap: 7 (ref 5–15)
BUN: 9 mg/dL (ref 6–20)
CO2: 24 mmol/L (ref 22–32)
Calcium: 9.1 mg/dL (ref 8.9–10.3)
Chloride: 103 mmol/L (ref 98–111)
Creatinine, Ser: 0.53 mg/dL — ABNORMAL LOW (ref 0.61–1.24)
GFR, Estimated: >60 mL/min (ref >60)
Glucose, Bld: 192 mg/dL — ABNORMAL HIGH (ref 70–99)
Potassium: 3.8 mmol/L (ref 3.5–5.1)
Sodium: 134 mmol/L — ABNORMAL LOW (ref 135–145)

## 2020-10-11 ENCOUNTER — Telehealth (HOSPITAL_COMMUNITY): Payer: Self-pay

## 2020-10-11 ENCOUNTER — Other Ambulatory Visit (HOSPITAL_COMMUNITY): Payer: Self-pay

## 2020-10-11 NOTE — Telephone Encounter (Signed)
Pharmacy Transitions of Care Follow-up Telephone Call  Date of discharge: 09/22/20  Discharge Diagnosis: STEMI  How have you been since you were released from the hospital?  Patient doing well since discharge but is still tiring easily and experiencing shortness of breath. Patient also states they have experienced blood in the urine prior to starting blood thinner. MD at [facility] is aware of past experiences with blood in urine. Counseled patient about looking for blood in urine while on a blood thinner. No questions about meds at this time.  Medication changes made at discharge:     START taking: Brilinta (ticagrelor)  Entresto (sacubitril-valsartan)  spironolactone (ALDACTONE)  CHANGE how you take: rosuvastatin (CRESTOR)  STOP taking: amLODipine 10 MG tablet (NORVASC)  atenolol 25 MG tablet (TENORMIN)  atorvastatin 80 MG tablet (LIPITOR)  Choline Fenofibrate 135 MG capsule  hydrochlorothiazide 25 MG tablet (HYDRODIURIL)  isosorbide mononitrate 60 MG 24 hr tablet (IMDUR)  lisinopril 5 MG tablet (ZESTRIL)   Medication changes verified by the patient? Yes    Medication Accessibility:  Home Pharmacy:  [facility] [place]  Was the patient provided with refills on discharged medications? Yes   Have all prescriptions been transferred from [facility] to home pharmacy?  Yes  Is the patient able to afford medications? Has insurance    Medication Review:  TICAGRELOR (BRILINTA) Ticagrelor 90 mg BID initiated on 09/22/20.  - Educated patient on expected duration of therapy of aspirin with ticagrelor.  - Discussed importance of taking medication around the same time every day, - Reviewed potential DDIs with patient - Advised patient of medications to avoid (NSAIDs, aspirin maintenance doses>100 mg daily) - Educated that Tylenol (acetaminophen) will be the preferred analgesic to prevent risk of bleeding  - Emphasized importance of monitoring for signs and symptoms of bleeding (abnormal bruising,  prolonged bleeding, nose bleeds, bleeding from gums, discolored urine, black tarry stools)  - Educated patient to notify doctor if shortness of breath or abnormal heartbeat occur - Advised patient to alert all providers of antiplatelet therapy prior to starting a new medication or having a procedure    Follow-up Appointments:  PCP Hospital f/u appt confirmed? goes to see PCP at the [facility] next month  Specialist Hospital f/u appt confirmed? Scheduled to see Dr. Shirlee Latch on 12/28/20 @ Cardiology.   If their condition worsens, is the pt aware to call PCP or go to the Emergency Dept.? Yes  Final Patient Assessment: Patient has follow up scheduled and refills at home pharmacy

## 2020-10-19 ENCOUNTER — Ambulatory Visit (HOSPITAL_COMMUNITY)
Admission: RE | Admit: 2020-10-19 | Discharge: 2020-10-19 | Disposition: A | Discharge status: Home / Self Care | Payer: Self-pay | Source: Ambulatory Visit | Attending: Internal Medicine | Admitting: Internal Medicine

## 2020-10-19 ENCOUNTER — Other Ambulatory Visit (HOSPITAL_COMMUNITY): Payer: Self-pay

## 2020-10-19 ENCOUNTER — Other Ambulatory Visit: Payer: Self-pay

## 2020-10-19 VITALS — BP 110/68 | HR 72 | Wt 254.6 lb

## 2020-10-19 DIAGNOSIS — Z7902 Long term (current) use of antithrombotics/antiplatelets: Secondary | ICD-10-CM | POA: Insufficient documentation

## 2020-10-19 DIAGNOSIS — I5022 Chronic systolic (congestive) heart failure: Secondary | ICD-10-CM | POA: Insufficient documentation

## 2020-10-19 DIAGNOSIS — R570 Cardiogenic shock: Secondary | ICD-10-CM | POA: Insufficient documentation

## 2020-10-19 DIAGNOSIS — G4733 Obstructive sleep apnea (adult) (pediatric): Secondary | ICD-10-CM | POA: Insufficient documentation

## 2020-10-19 DIAGNOSIS — E119 Type 2 diabetes mellitus without complications: Secondary | ICD-10-CM | POA: Insufficient documentation

## 2020-10-19 DIAGNOSIS — R5383 Other fatigue: Secondary | ICD-10-CM | POA: Insufficient documentation

## 2020-10-19 DIAGNOSIS — Z79899 Other long term (current) drug therapy: Secondary | ICD-10-CM | POA: Insufficient documentation

## 2020-10-19 DIAGNOSIS — R42 Dizziness and giddiness: Secondary | ICD-10-CM | POA: Insufficient documentation

## 2020-10-19 DIAGNOSIS — I255 Ischemic cardiomyopathy: Secondary | ICD-10-CM | POA: Insufficient documentation

## 2020-10-19 DIAGNOSIS — I2510 Atherosclerotic heart disease of native coronary artery without angina pectoris: Secondary | ICD-10-CM | POA: Insufficient documentation

## 2020-10-19 DIAGNOSIS — Z7982 Long term (current) use of aspirin: Secondary | ICD-10-CM | POA: Insufficient documentation

## 2020-10-19 MED ORDER — EMPAGLIFLOZIN 25 MG PO TABS
ORAL_TABLET | ORAL | 11 refills | Status: DC
Start: 2020-10-19 — End: 2021-01-17

## 2020-10-19 MED ORDER — SPIRONOLACTONE 25 MG PO TABS: 25.0000 mg | ORAL_TABLET | Freq: Every day | ORAL | 5 refills | Status: DC

## 2020-10-19 MED ORDER — ASPIRIN 81 MG PO TBEC: 81.0000 mg | DELAYED_RELEASE_TABLET | Freq: Every day | ORAL | 11 refills | Status: DC

## 2020-10-19 MED ORDER — TICAGRELOR 90 MG PO TABS: 90.0000 mg | ORAL_TABLET | Freq: Two times a day (BID) | ORAL | 11 refills | Status: DC

## 2020-10-19 MED ORDER — SACUBITRIL-VALSARTAN 24-26 MG PO TABS: 1.0000 | ORAL_TABLET | Freq: Two times a day (BID) | ORAL | 11 refills | Status: DC

## 2020-10-19 NOTE — Progress Notes (Signed)
[facility]: [facility] PCP-Cardiologist: Kristeen Miss, MD  HF Cardiologist: Dr. Shirlee Latch   HPI: Tommy Liu is a 55 y.o. with history of CAD (STEMI 09/19/20), HTN, type 2 diabetes, OSA but not on CPAP. Patient was last admitted in 05/2017 with unstable angina.  Cath showed 90% proximal stenosis of nondominant RCA and 40% stenosis proximal LAD.  Echo showed EF 60-65%.  He was managed medically.  He was lost to followup as an outpatient.    Transferred from [place] to [facility] on 09/19/2020 with anterior STEMI and cardiogenic shock. He was hypotensive on arrival and started on NE. Coronary angiogram demonstrated 90% thrombotic stenosis in mid LAD treated with PCI/DES, suspected embolic disease causing occlusion in distal LAD, chronic OM1 occlusion with collaterals, 90% proximal non-dominant RCA. RHC showed mean RA 9, PA 26/11, mean PCWP 16 with LVEDP 23, CI 2.46. Echo 09/20/2020 with LVEF of 25-30%, WMA consistent with large LAD territory infarct, no LV thrombus, RV okay, RVSP 30 mmHg, no MR.  Diuresed with IV lasix. Initiated on GDMT with Entresto, empagliflozin, and spironolactone. BP too soft for beta blocker. LifeVest placed prior to discharge; discharge weight 220 lbs.   He presented to HF Clinic on 09/27/2020 to establish post hospital HF care, patient came with his ex-wife. He was not significantly dyspneic with activity, but endorsed feeling limited by fatigue. At that time, denied CP, dizziness, abnormal bleeding, edema, or PND/Orthopnea. His appetite was poor. He was not weighing regularly at home but indicated he had a scale. He reported taking all medications. Blood sugars were 170-190. He reported that he receives his primary care at the [place]. 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 He was wearing LifeVest, no issues reported.  Today he returns to HF clinic for pharmacist medication titration. At last visit with  APP, no medication changes were made due to low BP in clinic. Labs on 09/27/2020 showed low potassium (3.3) and KCl 20 mEq daily was ordered, but patient never started. Repeat labs on 10/08/2020 showed potassium WNL. Of note, patient never started Jardiance and has been taking lisinopril in addition to [place]. Overall he reports feeling tired and has been experiencing depression around his diagnosis. Has fatigue often and dizziness/lightheadedness once or twice a week. No chest pain or palpitations. He says his breathing is "okay". He reports being able to increase activity as recommended by CR, although he gets really tired. Endorses SOB with limited activity, but does not interfere with activity. Weight at home has been around 250 lbs. No LEE, PND or orthopnea. His appetite is good and limits salt intake.   HF Medications: Entresto 24/26 mg BID Spironolactone 12.5 mg daily Jardiance 12.5 mg daily - never started   Has the patient been experiencing any side effects to the medications prescribed?  No  Does the patient have any problems obtaining medications due to transportation or finances?    Patient does not have insurance, but gets care and  medications from [place] in [place]. Prescriptions sent to [place] today. Patient was given samples of Jardiance while waiting on fill from [place].  Understanding of regimen: good Understanding of indications: fair Potential of compliance: good Patient understands to avoid NSAIDs. Patient understands to avoid decongestants.    Pertinent Lab Values: 10/08/2020: Serum creatinine 0.53, BUN 9, Potassium 3.8, Sodium 134  Vital Signs: Weight: 254.6 lbs (last clinic weight: 237.2 lbs) Blood pressure: 110/68  Heart rate: 72  Assessment/Plan: 1. CAD: History of unstable angina 05/2017, medically managed (90% stenosis nondominant RCA).  Admitted with acute anterior MI.  Cath with 90% thrombotic stenosis mid LAD, suspected embolic occlusion distal LAD, CTO OM1 with  collaterals, 90% nondominant RCA.  Patient treated with DES to mid LAD.  No chest pain.  - Continue ticagrelor and ASA 81.  - rosuvastatin 40 mg daily.   2. Chronic systolic CHF/early cardiogenic shock: LV-gram with EF 25%, ischemic cardiomyopathy.  Patient was hypotensive pre-PCI, but post-PCI BP improved.  Echo on 09/20/2020 showed EF 25-30%, LAD wall motion abnormalities, no LV thrombus, normal RV.  - NYHA II-early III, euvolemic on exam.  - Continue Entresto 24/26 mg BID. He was instructed to STOP lisinopril today.  - Increase spironolactone to 25 mg daily, repeat BMET in 1 week. Will discontinue potassium supplements from medication list as he never started them.   - Start Jardiance 12.5 mg daily (VA generally only carries 25 mg tablet strength).  - Continue LifeVest. Will continue gradual med titration until fully optimized.  - Will need repeat echo in 12/2020. ICD if EF <35%. - He has been referred to CR. 3. Diabetes: will need f/u at [facility]. - A1c 11.9 (09/2020). -Start Jardiance 12.5 mg daily as above 4. OSA: Strong suspicion.  Needs sleep study but has no insurance. He plans to enroll in next open enrollment. Will defer until then.  Follow up with APP on 11/09/2020  Mitzie Na, PharmD PGY-1 Community Pharmacy Resident  Karle Plumber, PharmD, BCPS, [facility], CPP Heart Failure Clinic Pharmacist [phone]

## 2020-10-19 NOTE — Patient Instructions (Addendum)
It was a pleasure seeing you today!  MEDICATIONS: -We are changing your medications today -Stop taking lisinopril -Restart Jardiance 12.5 mg (1/2 tablet) daily -Increase spironolactone to 25 mg (1 tablet) daily -Do not start potassium supplement -Call if you have questions about your medications.   NEXT APPOINTMENT: Return to clinic in 3 weeks with APP Clinic.  In general, to take care of your heart failure: -Limit your fluid intake to 2 Liters (half-gallon) per day.   -Limit your salt intake to ideally 2-3 grams (2000-3000 mg) per day. -Weigh yourself daily and record, and bring that "weight diary" to your next appointment.  (Weight gain of 2-3 pounds in 1 day typically means fluid weight.) -The medications for your heart are to help your heart and help you live longer.   -Please contact us before stopping any of your heart medications.  Call the clinic at [phone] with questions or to reschedule future appointments.

## 2020-10-20 ENCOUNTER — Telehealth (HOSPITAL_COMMUNITY): Payer: Self-pay | Admitting: Pharmacist

## 2020-10-20 DIAGNOSIS — I5022 Chronic systolic (congestive) heart failure: Secondary | ICD-10-CM

## 2020-10-20 NOTE — Telephone Encounter (Signed)
BMET order placed. 

## 2020-10-27 ENCOUNTER — Other Ambulatory Visit (HOSPITAL_COMMUNITY): Payer: Self-pay

## 2020-11-05 ENCOUNTER — Encounter (HOSPITAL_COMMUNITY): Payer: Self-pay | Admitting: *Deleted

## 2020-11-05 NOTE — Progress Notes (Signed)
Pt's STD forms completed with est RTW 01/03/21, forms signed by Dr Shirlee Latch and faxed to [employer] at [phone]

## 2020-11-08 ENCOUNTER — Telehealth (HOSPITAL_COMMUNITY): Payer: Self-pay

## 2020-11-08 NOTE — Progress Notes (Addendum)
Advanced Heart Failure Clinic Note    [facility] PCP-Cardiologist: Tommy Miss, MD  HF Cardiologist: Dr. Shirlee Liu  HPI: Tommy Liu is a 55 y.o. with history of CAD, HTN, type 2 diabetes, OSA but not on CPAP presented today with acute anterior MI.  Patient was last admitted in 5/19 with unstable angina.  Cath showed 90% proximal stenosis of nondominant RCA and 40% stenosis proximal LAD.  Echo showed EF 60-65%.  He was managed medically.  He was lost to followup as an outpatient.    Transferred from [place] to [facility] on 09/19/20 with anterior STEMI and cardiogenic shock. He was hypotensive on arrival and started on NE. Coronary angiogram demonstrated 90% thrombotic stenosis in mid LAD treated with PCI/DES, suspected embolic disease causing occlusion in distal LAD, chronic OM1 occlusion with collaterals, 90% proximal non-dominant RCA. RHC showed mean RA 9, PA 26/11, mean PCWP 16 with LVEDP 23, CI 2.46. Echo 09/19 with LVEF of 25-30%, WMA consistent with large LAD territory infarct, no LV thrombus, RV okay, RVSP 30 mmHg, no MR.  Diuresed with IV lasix. Initiated on GDMT with Entresto, empagliflozin, and spironolactone. BP too soft for beta blocker. LifeVest placed prior to discharge; discharge weight 220 lbs.  Today he returns for HF follow up with his ex-wife. He remains SOB walking further distances on flat ground. He gets SOB with stairs as well. Having some dizziness with position changes but no falls.  Denies palpitations, CP, edema, or PND/Orthopnea. Appetite ok. No fever or chills. Weight at home 254 pounds. Taking all medications. He gets his primary care at the [place]. 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 Wearing LifeVest, no issues.  Past Medical History:  Diagnosis Date   CAD in native artery    a. Cath 05/2017 -  90% pRCA (small & nondominant) and 40% pLAD >> medical mangment    Diabetes mellitus without  complication (HCC)    Hypertension    Morbid obesity (HCC)    Obstructive sleep apnea    Current Outpatient Medications  Medication Sig Dispense Refill   acetaminophen (TYLENOL) 325 MG tablet Take 650 mg by mouth every 6 (six) hours as needed for moderate pain.     aspirin 81 MG EC tablet Take 1 tablet (81 mg total) by mouth daily. 30 tablet 11   empagliflozin (JARDIANCE) 25 MG TABS tablet Take 12.5 mg (1/2 tablet) daily 15 tablet 11   glipiZIDE (GLUCOTROL) 10 MG tablet Take 10 mg by mouth 2 (two) times daily before a meal.      metFORMIN (GLUCOPHAGE) 500 MG tablet Take 1,000 mg by mouth 2 (two) times daily with a meal.      nitroGLYCERIN (NITROSTAT) 0.4 MG SL tablet Place 1 tablet (0.4 mg total) under the tongue every 5 (five) minutes x 3 doses as needed for chest pain. 25 tablet 0   rosuvastatin (CRESTOR) 40 MG tablet Take 1 tablet (40 mg total) by mouth daily. 30 tablet 1   sacubitril-valsartan (ENTRESTO) 24-26 MG Take 1 tablet by mouth 2 (two) times daily. 60 tablet 11   Semaglutide,0.25 or 0.5MG /DOS, 2 MG/1.5ML SOPN Inject 0.5 mg as directed once a week.     spironolactone (ALDACTONE) 25 MG tablet Take 1 tablet (25 mg total) by mouth daily. 30 tablet 5   tamsulosin (FLOMAX) 0.4 MG CAPS capsule Take 1 capsule by mouth daily.     ticagrelor (BRILINTA) 90 MG TABS tablet Take 1  tablet (90 mg total) by mouth 2 (two) times daily. 60 tablet 11   No current facility-administered medications for this encounter.   Allergies  Allergen Reactions   Penicillins Anaphylaxis and Other (See Comments)    Has patient had a PCN reaction causing immediate rash, facial/tongue/throat swelling, SOB or lightheadedness with hypotension: Yes Has patient had a PCN reaction causing severe rash involving mucus membranes or skin necrosis: No Has patient had a PCN reaction that required hospitalization: Unknown Has patient had a PCN reaction occurring within the last 10 years: No If all of the above answers are  "NO", then may proceed with Cephalosporin use.    Social History   Socioeconomic History   Marital status: Divorced    Spouse name: Not on file   Number of children: Not on file   Years of education: Not on file   Highest education level: Not on file  Occupational History   Not on file  Tobacco Use   Smoking status: Former    Packs/day: 1.00    Years: 30.00    Pack years: 30.00    Types: Cigarettes    Quit date: 01/02/1997    Years since quitting: 23.8   Smokeless tobacco: Former  Building services engineer Use: Never used  Substance and Sexual Activity   Alcohol use: Yes    Alcohol/week: 2.0 standard drinks    Types: 2 Cans of beer per week   Drug use: Never   Sexual activity: Not on file  Other Topics Concern   Not on file  Social History Narrative   Lives alone.  Still has support of ex-wife and other relatives   Social Determinants of Health   Financial Resource Strain: Medium Risk   Difficulty of Paying Living Expenses: Somewhat hard  Food Insecurity: No Food Insecurity   Worried About Programme researcher, broadcasting/film/video in the Last Year: Never true   Ran Out of Food in the Last Year: Never true  Transportation Needs: No Transportation Needs   Lack of Transportation (Medical): No   Lack of Transportation (Non-Medical): No  Physical Activity: Not on file  Stress: Not on file  Social Connections: Not on file  Intimate Partner Violence: Not on file   Family History  Problem Relation Age of Onset   Hypertension Father    BP 90/60   Pulse 76   Wt 115.5 kg (254 lb 9.6 oz)   SpO2 99%   BMI 32.67 kg/m   Wt Readings from Last 3 Encounters:  11/09/20 115.5 kg (254 lb 9.6 oz)  10/19/20 115.5 kg (254 lb 9.6 oz)  09/27/20 107.6 kg (237 lb 3.2 oz)   PHYSICAL EXAM: General:  NAD. No resp difficulty HEENT: Normal Neck: Supple. Thick neck. Carotids 2+ bilat; no bruits. No lymphadenopathy or thryomegaly appreciated. Cor: PMI nondisplaced. Regular rate & rhythm. No rubs, gallops or  murmurs. Lungs: Clear, LifeVest in place Abdomen: Obese, nontender, nondistended. No hepatosplenomegaly. No bruits or masses. Good bowel sounds. Extremities: No cyanosis, clubbing, rash, edema Neuro: Alert & oriented x 3, cranial nerves grossly intact. Moves all 4 extremities w/o difficulty. Affect pleasant.  LifeVest: wear time 43 days, no treated events, average HR 73 bpm, 3633 steps (personally reviewed).  ReDs: 50%  ASSESSMENT & PLAN: 1. CAD: History of unstable angina 5/19, medically managed (90% stenosis nondominant RCA).  Admitted with acute anterior MI.  Cath with 90% thrombotic stenosis mid LAD, suspected embolic occlusion distal LAD, CTO OM1 with collaterals, 90% nondominant RCA.  Patient treated with DES to mid LAD.  No chest pain.  - Continue ticagrelor and ASA 81.  - Crestor 40 mg daily.   2. Chronic systolic CHF: LV-gram with EF 69%, ischemic cardiomyopathy.  Patient was hypotensive pre-PCI, but post-PCI BP improved.  Echo showed EF 25-30%, LAD wall motion abnormalities, no LV thrombus, normal RV. NYHA II-early III, exam is difficult for volume, but he does not appear significantly volume overloaded on exam, however ReDs 50% (may be overestimate) and weight up 15 lbs in 6 weeks. ? Weight accuracy if previously not weighed w/ LifeVest on. - With low BP, stop Entresto and start Losartan 12.5 mg q hs. - Start Lasix 20 mg daily + 10 mEq of KCL. BMET today, repeat 1 week. - Continue empagliflozin 12.5 mg daily. No GU symptoms, watch with uncontrolled DM. - Continue spironolactone 25 daily.  - BP too soft for beta blocker. - Continue LifeVest. Will continue gradual med titration until fully optimized.  - Repeat echo. ICD if EF <35%. - He has been referred to CR. 3. Diabetes: Followed at [facility]. - A1c 11.9 (9/22). 4. OSA: Strong suspicion.  Needs sleep study but has no insurance. He plans to enroll in next open enrollment. Will defer until then.  He was given a note today for his  employer stating a return to work date will be determined at his follow up, pending echo results.  Follow up with Dr. Shirlee Liu + echo next month as scheduled.  Anderson [place], FNP 11/09/20

## 2020-11-08 NOTE — Telephone Encounter (Signed)
Called to confirm/remind patient of their appointment at the Advanced Heart Failure Clinic on 11/09/20.   Patient reminded to bring all medications and/or complete list.  Confirmed patient has transportation. Gave directions, instructed to utilize valet parking.  Confirmed appointment prior to ending call.

## 2020-11-09 ENCOUNTER — Ambulatory Visit (HOSPITAL_COMMUNITY)
Admission: RE | Admit: 2020-11-09 | Discharge: 2020-11-09 | Disposition: A | Discharge status: Home / Self Care | Payer: Self-pay | Source: Ambulatory Visit | Attending: Family Medicine | Admitting: Family Medicine

## 2020-11-09 ENCOUNTER — Encounter (HOSPITAL_COMMUNITY): Payer: Self-pay

## 2020-11-09 ENCOUNTER — Other Ambulatory Visit: Payer: Self-pay

## 2020-11-09 VITALS — BP 90/60 | HR 76 | Wt 254.6 lb

## 2020-11-09 DIAGNOSIS — E119 Type 2 diabetes mellitus without complications: Secondary | ICD-10-CM | POA: Insufficient documentation

## 2020-11-09 DIAGNOSIS — R0602 Shortness of breath: Secondary | ICD-10-CM | POA: Insufficient documentation

## 2020-11-09 DIAGNOSIS — I2510 Atherosclerotic heart disease of native coronary artery without angina pectoris: Secondary | ICD-10-CM

## 2020-11-09 DIAGNOSIS — I252 Old myocardial infarction: Secondary | ICD-10-CM | POA: Insufficient documentation

## 2020-11-09 DIAGNOSIS — Z87891 Personal history of nicotine dependence: Secondary | ICD-10-CM | POA: Insufficient documentation

## 2020-11-09 DIAGNOSIS — I110 Hypertensive heart disease with heart failure: Secondary | ICD-10-CM | POA: Insufficient documentation

## 2020-11-09 DIAGNOSIS — R42 Dizziness and giddiness: Secondary | ICD-10-CM | POA: Insufficient documentation

## 2020-11-09 DIAGNOSIS — I5022 Chronic systolic (congestive) heart failure: Secondary | ICD-10-CM

## 2020-11-09 DIAGNOSIS — Z7902 Long term (current) use of antithrombotics/antiplatelets: Secondary | ICD-10-CM | POA: Insufficient documentation

## 2020-11-09 DIAGNOSIS — G4733 Obstructive sleep apnea (adult) (pediatric): Secondary | ICD-10-CM

## 2020-11-09 DIAGNOSIS — Z955 Presence of coronary angioplasty implant and graft: Secondary | ICD-10-CM | POA: Insufficient documentation

## 2020-11-09 DIAGNOSIS — Z09 Encounter for follow-up examination after completed treatment for conditions other than malignant neoplasm: Secondary | ICD-10-CM | POA: Insufficient documentation

## 2020-11-09 DIAGNOSIS — Z79899 Other long term (current) drug therapy: Secondary | ICD-10-CM | POA: Insufficient documentation

## 2020-11-09 DIAGNOSIS — E1159 Type 2 diabetes mellitus with other circulatory complications: Secondary | ICD-10-CM

## 2020-11-09 DIAGNOSIS — I255 Ischemic cardiomyopathy: Secondary | ICD-10-CM | POA: Insufficient documentation

## 2020-11-09 LAB — BASIC METABOLIC PANEL
Anion gap: 9 (ref 5–15)
BUN: 13 mg/dL (ref 6–20)
CO2: 24 mmol/L (ref 22–32)
Calcium: 9.2 mg/dL (ref 8.9–10.3)
Chloride: 104 mmol/L (ref 98–111)
Creatinine, Ser: 0.6 mg/dL — ABNORMAL LOW (ref 0.61–1.24)
GFR, Estimated: >60 mL/min (ref >60)
Glucose, Bld: 120 mg/dL — ABNORMAL HIGH (ref 70–99)
Potassium: 4 mmol/L (ref 3.5–5.1)
Sodium: 137 mmol/L (ref 135–145)

## 2020-11-09 MED ORDER — LOSARTAN POTASSIUM 25 MG PO TABS: 12.5000 mg | ORAL_TABLET | Freq: Every evening | ORAL | 1 refills | Status: DC

## 2020-11-09 MED ORDER — FUROSEMIDE 20 MG PO TABS: 20.0000 mg | ORAL_TABLET | Freq: Every day | ORAL | 1 refills | Status: DC

## 2020-11-09 NOTE — Patient Instructions (Signed)
Labs were done today, if any labs are abnormal the clinic will call you  Your physician recommends that you return for lab work in: 10 days   STOP Entresto  START Losartan 12.5 mg 1/2 tablet at bedtime  START Lasix 20 mg 1 tablet daily   CHECK Blood pressure at home and log each reading daily if systolic (top) blood pressure is less than 90 please call the clinic [phone]  At the Advanced Heart Failure Clinic, you and your health needs are our priority. As part of our continuing mission to provide you with exceptional heart care, we have created designated Provider Care Teams. These Care Teams include your primary Cardiologist (physician) and Advanced Practice Providers (APPs- Physician Assistants and Nurse Practitioners) who all work together to provide you with the care you need, when you need it.   You may see any of the following providers on your designated Care Team at your next follow up: Dr Arvilla Meres Dr Carron Curie, NP Robbie Lis, [place] [facility] [place], [place] Karle Plumber, PharmD   Please be sure to bring in all your medications bottles to every appointment.   If you have any questions or concerns before your next appointment please send [place] a message through [place] or call our office at [phone].    TO LEAVE A MESSAGE FOR THE NURSE SELECT OPTION 2, PLEASE LEAVE A MESSAGE INCLUDING: YOUR NAME DATE OF BIRTH CALL BACK NUMBER REASON FOR CALL**this is important as we prioritize the call backs  YOU WILL RECEIVE A CALL BACK THE SAME DAY AS LONG AS YOU CALL BEFORE 4:00 PM

## 2020-11-09 NOTE — Addendum Note (Signed)
Encounter addended by: Jacklynn Ganong, FNP on: 11/09/2020 10:32 AM  Actions taken: Clinical Note Signed

## 2020-11-09 NOTE — Progress Notes (Signed)
ReDS Vest / Clip - 11/09/20 0900       ReDS Vest / Clip   Station Marker C    Ruler Value 29.5    ReDS Value Range High volume overload    ReDS Actual Value 50

## 2020-11-18 ENCOUNTER — Ambulatory Visit (HOSPITAL_COMMUNITY)
Admission: RE | Admit: 2020-11-18 | Discharge: 2020-11-18 | Disposition: A | Discharge status: Home / Self Care | Payer: Self-pay | Source: Ambulatory Visit | Attending: Internal Medicine | Admitting: Internal Medicine

## 2020-11-18 ENCOUNTER — Other Ambulatory Visit: Payer: Self-pay

## 2020-11-18 DIAGNOSIS — I5022 Chronic systolic (congestive) heart failure: Secondary | ICD-10-CM | POA: Insufficient documentation

## 2020-11-18 LAB — BASIC METABOLIC PANEL
Anion gap: 7 (ref 5–15)
BUN: 18 mg/dL (ref 6–20)
CO2: 27 mmol/L (ref 22–32)
Calcium: 9.3 mg/dL (ref 8.9–10.3)
Chloride: 102 mmol/L (ref 98–111)
Creatinine, Ser: 0.64 mg/dL (ref 0.61–1.24)
GFR, Estimated: >60 mL/min (ref >60)
Glucose, Bld: 135 mg/dL — ABNORMAL HIGH (ref 70–99)
Potassium: 3.8 mmol/L (ref 3.5–5.1)
Sodium: 136 mmol/L (ref 135–145)

## 2020-11-18 NOTE — Addendum Note (Signed)
Encounter addended by: Theresia Bough, CMA on: 11/18/2020 10:19 AM  Actions taken: Order list changed, Diagnosis association updated

## 2020-11-19 ENCOUNTER — Telehealth (HOSPITAL_COMMUNITY): Payer: Self-pay | Admitting: Licensed Clinical Social Worker

## 2020-11-19 NOTE — Progress Notes (Signed)
Heart and Vascular Care Navigation  11/19/2020  Tommy Liu [facility] February 22, 1965 [id number]  Reason for Referral: Pt requesting assistance with disability process and expressed concerns with financial strain.   Engaged with patient by telephone for follow up visit for Heart and Vascular Care Coordination.                                                                                                   Assessment:  CSW called pt to discuss above concerns.  Pt states that he is continuing to get disability through is work but it is only $120/week which is not enough to keep up with other costs.  He also gets $150/month through the [place] for partial disability.  Pt currently worried about not paying for this months rent ($440).  CSW informed pt we might be able to assist through our patient care fund- he gave CSW permission to contact his property management company ([employer]) to request w-9 and invoice so we can request possible check to help with this months rent.  They will send this information once the pt provides written permission to do so- pt informed and will plan to go by to provide permission.  Pt then inquired about getting medical documentation to prove his disability for a SSA application.  CSW explained that he would need to first apply for disability benefits through Social Security by calling them and making a phone or in person appt to apply.  Explained that after application was initiated they would request medical documentation from our office directly- pt expressed understanding and will plan to apply.                                   HRT/VAS Care Coordination     Living arrangements for the past 2 months Single Family Home   Lives with: Self   Home Assistive Devices/Equipment None   DME Agency [employer]       Social History:                                                                             SDOH Screenings   Alcohol Screen: Not on file  Depression  (PHQ2-9): Not on file  Financial Resource Strain: High Risk   Difficulty of Paying Living Expenses: Hard  Food Insecurity: No Food Insecurity   Worried About Programme researcher, broadcasting/film/video in the Last Year: Never true   Ran Out of Food in the Last Year: Never true  Housing: High Risk   Last Housing Risk Score: 2  Physical Activity: Not on file  Social Connections: Not on file  Stress: Not on file  Tobacco Use: Medium Risk   Smoking Tobacco Use: Former   Smokeless Tobacco  Use: Former   Passive Exposure: Not on Chartered certified accountant Needs: No Regulatory affairs officer (Medical): No   Lack of Transportation (Non-Medical): No    SDOH Interventions: Financial Resources:  Financial Strain Interventions: Other (Comment) (patient care fund)   Food Insecurity:   Non reported  Housing Insecurity:  Housing Interventions: Other (Comment) (patient care fund)  Transportation:    None reported   Follow-up plan:    Pt to go by property management company to provide written permission to send [place] invoice for what he owes.  Pt to call SSA to initiate disability application.  Will continue to follow and assist as needed  Burna Sis, LCSW Clinical Social Worker Advanced Heart Failure Clinic Desk#: [phone] Cell#: [phone]

## 2020-11-24 ENCOUNTER — Telehealth (HOSPITAL_COMMUNITY): Payer: Self-pay | Admitting: Licensed Clinical Social Worker

## 2020-11-24 NOTE — Telephone Encounter (Signed)
CSW received check to pay for pt rent through Patient Care Fund- sent to management company and updated Investment banker, corporate that it was on its way.  Informed patient that check put in the mail today.  Burna Sis, LCSW Clinical Social Worker [facility] Desk#: [phone] Cell#: [phone]

## 2020-12-13 ENCOUNTER — Telehealth (HOSPITAL_COMMUNITY): Payer: Self-pay | Admitting: Licensed Clinical Social Worker

## 2020-12-13 NOTE — Telephone Encounter (Signed)
Pt called to inquire about his short term disability renewal form.  CSW spoke with clinic staff who confirmed we had it and will try to complete today.  Pt updated  Burna Sis, LCSW Clinical Social Worker Advanced [facility] Clinic Desk#: [phone] Cell#: [phone]

## 2020-12-17 ENCOUNTER — Telehealth (HOSPITAL_COMMUNITY): Payer: Self-pay | Admitting: *Deleted

## 2020-12-17 NOTE — Telephone Encounter (Signed)
Pt left vm stating his disability paperwork had not been turned into [employer] and he now they have stopped payment. Pt requests return call.   Routed to [employer]

## 2020-12-21 NOTE — Telephone Encounter (Signed)
Forms completed, signed by Dr Shirlee Latch and faxed along with last OV note to [employer] at [phone], pt is aware this has been done

## 2020-12-28 ENCOUNTER — Ambulatory Visit (HOSPITAL_COMMUNITY)
Admission: RE | Admit: 2020-12-28 | Discharge: 2020-12-28 | Disposition: A | Discharge status: Home / Self Care | Payer: Self-pay | Source: Ambulatory Visit | Attending: Cardiology | Admitting: Cardiology

## 2020-12-28 ENCOUNTER — Encounter (HOSPITAL_COMMUNITY): Payer: Self-pay | Admitting: Cardiology

## 2020-12-28 ENCOUNTER — Ambulatory Visit (HOSPITAL_BASED_OUTPATIENT_CLINIC_OR_DEPARTMENT_OTHER)
Admission: RE | Admit: 2020-12-28 | Discharge: 2020-12-28 | Disposition: A | Discharge status: Home / Self Care | Payer: Self-pay | Source: Ambulatory Visit | Attending: Cardiology | Admitting: Cardiology

## 2020-12-28 ENCOUNTER — Other Ambulatory Visit: Payer: Self-pay

## 2020-12-28 VITALS — BP 124/80 | HR 78 | Wt 273.0 lb

## 2020-12-28 DIAGNOSIS — Z7982 Long term (current) use of aspirin: Secondary | ICD-10-CM | POA: Insufficient documentation

## 2020-12-28 DIAGNOSIS — G4733 Obstructive sleep apnea (adult) (pediatric): Secondary | ICD-10-CM | POA: Insufficient documentation

## 2020-12-28 DIAGNOSIS — I252 Old myocardial infarction: Secondary | ICD-10-CM | POA: Insufficient documentation

## 2020-12-28 DIAGNOSIS — I2510 Atherosclerotic heart disease of native coronary artery without angina pectoris: Secondary | ICD-10-CM

## 2020-12-28 DIAGNOSIS — I5022 Chronic systolic (congestive) heart failure: Secondary | ICD-10-CM

## 2020-12-28 DIAGNOSIS — I255 Ischemic cardiomyopathy: Secondary | ICD-10-CM | POA: Insufficient documentation

## 2020-12-28 DIAGNOSIS — I110 Hypertensive heart disease with heart failure: Secondary | ICD-10-CM | POA: Insufficient documentation

## 2020-12-28 DIAGNOSIS — Z955 Presence of coronary angioplasty implant and graft: Secondary | ICD-10-CM | POA: Insufficient documentation

## 2020-12-28 DIAGNOSIS — E119 Type 2 diabetes mellitus without complications: Secondary | ICD-10-CM | POA: Insufficient documentation

## 2020-12-28 DIAGNOSIS — Z79899 Other long term (current) drug therapy: Secondary | ICD-10-CM | POA: Insufficient documentation

## 2020-12-28 LAB — BASIC METABOLIC PANEL
Anion gap: 10 (ref 5–15)
BUN: 19 mg/dL (ref 6–20)
CO2: 25 mmol/L (ref 22–32)
Calcium: 9.5 mg/dL (ref 8.9–10.3)
Chloride: 97 mmol/L — ABNORMAL LOW (ref 98–111)
Creatinine, Ser: 0.61 mg/dL (ref 0.61–1.24)
GFR, Estimated: >60 mL/min (ref >60)
Glucose, Bld: 207 mg/dL — ABNORMAL HIGH (ref 70–99)
Potassium: 4.4 mmol/L (ref 3.5–5.1)
Sodium: 132 mmol/L — ABNORMAL LOW (ref 135–145)

## 2020-12-28 LAB — ECHOCARDIOGRAM COMPLETE
Area-P 1/2: 3.03 cm2
Calc EF: 30.3 %
S' Lateral: 4 cm
Single Plane A2C EF: 30.6 %
Single Plane A4C EF: 29.2 %

## 2020-12-28 LAB — BRAIN NATRIURETIC PEPTIDE: B Natriuretic Peptide: 97.6 pg/mL (ref 0.0–100.0)

## 2020-12-28 LAB — LIPID PANEL
Cholesterol: 252 mg/dL — ABNORMAL HIGH (ref 0–200)
HDL: 52 mg/dL (ref >40)
LDL Cholesterol: 168 mg/dL — ABNORMAL HIGH (ref 0–99)
Total CHOL/HDL Ratio: 4.8 RATIO
Triglycerides: 161 mg/dL — ABNORMAL HIGH (ref <150)
VLDL: 32 mg/dL (ref 0–40)

## 2020-12-28 MED ORDER — ENTRESTO 24-26 MG PO TABS: 1.0000 | ORAL_TABLET | Freq: Two times a day (BID) | ORAL | 3 refills | Status: DC

## 2020-12-28 MED ORDER — SPIRONOLACTONE 25 MG PO TABS: 25.0000 mg | ORAL_TABLET | Freq: Every day | ORAL | 5 refills | Status: DC

## 2020-12-28 MED ORDER — PERFLUTREN LIPID MICROSPHERE
1.0000 mL | INTRAVENOUS | Status: DC | PRN
  Administered 2020-12-28: 11:00:00 2 mL via INTRAVENOUS
  Filled 2020-12-28: qty 10

## 2020-12-28 NOTE — Progress Notes (Signed)
Advanced Heart Failure Clinic Note    [facility]: [facility] PCP-Cardiologist: Kristeen Miss, MD  HF Cardiologist: Dr. Lillia Pauls Tommy Liu is a 55 y.o. with history of CAD, HTN, type 2 diabetes, OSA but not on CPAP and ischemic CMP presents for followup of CAD and CHF.  Patient was admitted in 5/19 with unstable angina.  Cath showed 90% proximal stenosis of nondominant RCA and 40% stenosis proximal LAD.  Echo showed EF 60-65%.  He was managed medically.  He was lost to followup as an outpatient until 9/22.    Transferred from [place] to [facility] on 09/19/20 with anterior STEMI and cardiogenic shock. He was hypotensive on arrival and started on norepinephrine. Coronary angiogram demonstrated 90% thrombotic stenosis in mid LAD treated with PCI/DES, suspected embolic disease causing occlusion in distal LAD, chronic OM1 occlusion with collaterals, 90% proximal non-dominant RCA. RHC showed mean RA 9, PA 26/11, mean PCWP 16 with LVEDP 23, CI 2.46. Echo 9/22 with LVEF of 25-30%, WMA consistent with large LAD territory infarct, no LV thrombus, RV okay, RVSP 30 mmHg, no MR.  Diuresed with IV lasix. Initiated on GDMT with Entresto, empagliflozin, and spironolactone. BP too soft for beta blocker. LifeVest placed prior to discharge; discharge weight 220 lbs.    Entresto subsequently stopped and losartan started with soft BP.   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   Today he returns for HF followup.  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  He has occasional chest pain, this is atypical with no trigger and does not last long.  No dyspnea walking on flat ground but feels like energy level is poor in general.  Weight is up about 19 lbs.  Not getting much exercise and eating poorly.  No lightheadedness, SBP has been in the 120s. He is wearing his Lifevest.   REDS clip 30%  ECG  (personally reviewed): NSR, old anterior MI with persistent anterior ST elevation (no change).   Labs (9/22): LDL 105 Labs (11/22): K 3.8, creatinine 0.64  PMH: 1. HTN 2. Type 2 diabetes 3. OSA: Does not have CPAP.  4. CAD: Unstable angina in 5/19, cath with 90% proximal nondominant small RCA.  - 9/22 anterior STEMI: 90% thrombotic mid LAD stenosis treated with DES, total occlusion of distal LAD likely by embolic event, totally occluded OM1 with collaterals, 90% proximal stenosis nondominant RCA.  5. Chronic systolic CHF: Ischemic cardiomyopathy.   - Echo (9/22): EF 25-30%, LAD territory WMAs, RV ok.  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    Current Outpatient Medications  Medication Sig Dispense Refill   acetaminophen (TYLENOL) 325 MG tablet Take 650 mg by mouth every 6 (six) hours as needed for moderate pain.     aspirin 81 MG EC tablet Take 1 tablet (81 mg total) by mouth daily. 30 tablet 11   empagliflozin (JARDIANCE) 25 MG TABS tablet Take 12.5 mg (1/2 tablet) daily 15 tablet 11   furosemide (LASIX) 20 MG tablet Take 1 tablet (20 mg total) by mouth daily. 90 tablet 1   glipiZIDE (GLUCOTROL) 10 MG tablet Take 10 mg by mouth 2 (two) times daily before a meal.      metFORMIN (GLUCOPHAGE) 500 MG tablet Take 1,000 mg by mouth 2 (two) times daily with a meal.      nitroGLYCERIN (NITROSTAT)  0.4 MG SL tablet Place 1 tablet (0.4 mg total) under the tongue every 5 (five) minutes x 3 doses as needed for chest pain. 25 tablet 0   rosuvastatin (CRESTOR) 40 MG tablet Take 1 tablet (40 mg total) by mouth daily. 30 tablet 1   sacubitril-valsartan (ENTRESTO) 24-26 MG Take 1 tablet by mouth 2 (two) times daily. 60 tablet 3   Semaglutide,0.25 or 0.5MG /DOS, 2 MG/1.5ML SOPN Inject 0.5 mg as directed once a week.     tamsulosin (FLOMAX) 0.4 MG CAPS capsule Take 1 capsule by mouth daily.     ticagrelor (BRILINTA) 90 MG TABS tablet Take 1 tablet (90 mg total) by  mouth 2 (two) times daily. 60 tablet 11   spironolactone (ALDACTONE) 25 MG tablet Take 1 tablet (25 mg total) by mouth at bedtime. 30 tablet 5   No current facility-administered medications for this encounter.   Allergies  Allergen Reactions   Penicillins Anaphylaxis and Other (See Comments)    Has patient had a PCN reaction causing immediate rash, facial/tongue/throat swelling, SOB or lightheadedness with hypotension: Yes Has patient had a PCN reaction causing severe rash involving mucus membranes or skin necrosis: No Has patient had a PCN reaction that required hospitalization: Unknown Has patient had a PCN reaction occurring within the last 10 years: No If all of the above answers are "NO", then may proceed with Cephalosporin use.    Social History   Socioeconomic History   Marital status: Divorced    Spouse name: Not on file   Number of children: Not on file   Years of education: Not on file   Highest education level: Not on file  Occupational History   Not on file  Tobacco Use   Smoking status: Former    Packs/day: 1.00    Years: 30.00    Pack years: 30.00    Types: Cigarettes    Quit date: 01/02/1997    Years since quitting: 24.0   Smokeless tobacco: Former  Building services engineer Use: Never used  Substance and Sexual Activity   Alcohol use: Yes    Alcohol/week: 2.0 standard drinks    Types: 2 Cans of beer per week   Drug use: Never   Sexual activity: Not on file  Other Topics Concern   Not on file  Social History Narrative   Lives alone.  Still has support of ex-wife and other relatives   Social Determinants of Health   Financial Resource Strain: High Risk   Difficulty of Paying Living Expenses: Hard  Food Insecurity: No Food Insecurity   Worried About Programme researcher, broadcasting/film/video in the Last Year: Never true   Ran Out of Food in the Last Year: Never true  Transportation Needs: No Transportation Needs   Lack of Transportation (Medical): No   Lack of Transportation  (Non-Medical): No  Physical Activity: Not on file  Stress: Not on file  Social Connections: Not on file  Intimate Partner Violence: Not on file   Family History  Problem Relation Age of Onset   Hypertension Father    BP 124/80    Pulse 78    Wt 123.8 kg (273 lb)    SpO2 98%    BMI 35.04 kg/m   Wt Readings from Last 3 Encounters:  12/28/20 123.8 kg (273 lb)  11/09/20 115.5 kg (254 lb 9.6 oz)  10/19/20 115.5 kg (254 lb 9.6 oz)   PHYSICAL EXAM: General: NAD, obese. Neck: JVP 7-8 cm, no thyromegaly or thyroid  nodule.  Lungs: Clear to auscultation bilaterally with normal respiratory effort. CV: Nondisplaced PMI.  Heart regular S1/S2, no S3/S4, no murmur.  Trace ankle edema.  No carotid bruit.  Normal pedal pulses.  Abdomen: Soft, nontender, no hepatosplenomegaly, no distention.  Skin: Intact without lesions or rashes.  Neurologic: Alert and oriented x 3.  Psych: Normal affect. Extremities: No clubbing or cyanosis.  HEENT: Normal.   ASSESSMENT & PLAN: 1. CAD: History of unstable angina 5/19, medically managed (90% stenosis nondominant RCA).  Admitted with acute anterior MI in 9/22.  Cath with 90% thrombotic stenosis mid LAD, suspected embolic occlusion distal LAD, CTO OM1 with collaterals, 90% nondominant RCA.  Patient treated with DES to mid LAD.  He has only atypical chest pain.   - Continue ticagrelor and ASA 81.  - Crestor 40 mg daily.  Check lipids today.  2. Chronic systolic CHF: Ischemic cardiomyopathy.  Echo in 9/22 showed EF 25-30%, LAD wall motion abnormalities, no LV thrombus, normal RV. Echo today showed EF 30% with apical aneurysm but no thrombus.  Weight is up significantly but does not appear significantly volume overloaded on exam and REDS clip is in normal range (30%).  NYHA class III symptoms, primarily due to fatigue.  - I would like to try him again on Entresto.  Stop losartan, start Entresto 24/26 bid. BMET today and in 10 days.  - Continue Lasix 20 mg daily.  He  does not look volume overloaded despite weight gain and we are also increasing Entresto.  - Continue empagliflozin 12.5 mg daily.  - Continue spironolactone 25 daily.  - Low dose Coreg will be next step.  - EF is in ICD range, I will refer to EP.  He will have insurance per his report starting in January.  Keep on Lifevest until he has ICD.  - He has been referred to CR at [facility], should be able to participate when insurance is active. 3. Diabetes: Followed at [facility]. 4. OSA: Strong suspicion.  Needs sleep study, hopefully can get this through the [place].   I am not sure he is going to be able to return to his current work after ICD placement due to strong magnet use, but I think with apical aneurysm that he needs ICD. Will have EP address this.  Will write note to keep him out of work another 6 wks. Followup with HF pharmacist in 3 wks for med titration then see me in 6 wks.   Marca Ancona, MD 12/28/20

## 2020-12-28 NOTE — Patient Instructions (Signed)
Medication Changes:  Stop Losartan  Start Entresto 24/26 mg Twice daily   Take Spironolactone at bedtime  Lab Work:  Done today, we will call you for abnormal results  Your physician recommends that you return for lab work in: 2 weeks, we have given you a prescription to have this done locally  Testing/Procedures:  Please discuss getting a Sleep Study with your provider at the [place]  Referrals:  To EP to discuss getting a defibrillator, [facility] will call you for an appointment  Our Social Work team will call you to follow-up on your disability  Special Instructions // Education:  Do the following things EVERYDAY: Weigh yourself in the morning before breakfast. Write it down and keep it in a log. Take your medicines as prescribed Eat low salt foods--Limit salt (sodium) to 2000 mg per day.  Stay as active as you can everyday Limit all fluids for the day to less than 2 liters  Follow-Up in: 3 weeks with our Pharmacist and 6 weeks with Dr Shirlee Latch  At the [facility], you and your health needs are our priority. We have a designated team specialized in the treatment of Heart Failure. This Care Team includes your primary Heart Failure Specialized Cardiologist (physician), Advanced Practice Providers (APPs- Physician Assistants and Nurse Practitioners), and Pharmacist who all work together to provide you with the care you need, when you need it.   You may see any of the following providers on your designated Care Team at your next follow up:  Dr Arvilla Meres Dr Carron Curie, NP Robbie Lis, [place] [facility] [place], [place] Karle Plumber, PharmD   Please be sure to bring in all your medications bottles to every appointment.   Need to Contact us:  If you have any questions or concerns before your next appointment please send [place] a message through [place] or call our office at [phone].    TO LEAVE A MESSAGE FOR THE NURSE SELECT  OPTION 2, PLEASE LEAVE A MESSAGE INCLUDING: YOUR NAME DATE OF BIRTH CALL BACK NUMBER REASON FOR CALL**this is important as we prioritize the call backs  YOU WILL RECEIVE A CALL BACK THE SAME DAY AS LONG AS YOU CALL BEFORE 4:00 PM

## 2020-12-28 NOTE — Progress Notes (Signed)
°  Echocardiogram 2D Echocardiogram has been performed.  Tommy Liu 12/28/2020, 11:20 AM

## 2020-12-28 NOTE — Progress Notes (Signed)
ReDS Vest / Clip - 12/28/20 1200       ReDS Vest / Clip   Station Marker D    Ruler Value 36    ReDS Value Range Low volume    ReDS Actual Value 30

## 2020-12-29 ENCOUNTER — Telehealth (HOSPITAL_COMMUNITY): Payer: Self-pay

## 2020-12-29 ENCOUNTER — Telehealth (HOSPITAL_COMMUNITY): Payer: Self-pay | Admitting: Licensed Clinical Social Worker

## 2020-12-29 DIAGNOSIS — I5022 Chronic systolic (congestive) heart failure: Secondary | ICD-10-CM

## 2020-12-29 NOTE — Telephone Encounter (Signed)
CSW consulted to speak with pt about disability.  Pt reports he has phone appt on January 5th to start disability process and wanting CSW to send them medical documentation.  CSW explained that once pt starts application process they would send [place] a request for medical documentation directly- pt expressed understanding.  Will continue to follow and assist as needed  Burna Sis, LCSW Clinical Social Worker [facility] Desk#: [phone] Cell#: [phone]

## 2020-12-29 NOTE — Telephone Encounter (Addendum)
Pt aware, agreeable, and verbalized understanding. Referral sent to lipid clinic  ----- Message from Laurey Morale, MD sent at 12/28/2020  4:42 PM EST ----- LDL is way too high.  If he is not taking Crestor 40 mg daily, he needs to start it.  If he is taking Crestor, he needs referral to lipid clinic for Repatha. Lipids/LFTs in 2 months after starting back on Crestor or starting Repatha.

## 2021-01-10 NOTE — Progress Notes (Incomplete)
***In Progress***    Advanced Heart Failure Clinic Note   PCP: [facility] [place] PCP-Cardiologist: Mertie Moores, MD  HF Cardiologist: Dr. Aundra Dubin  HPI:  Tommy Liu is a 56 y.o. with history of CAD, HTN, type 2 diabetes, OSA but not on CPAP and ischemic CMP. Patient was admitted in 5/19 with unstable angina. Cath showed 90% proximal stenosis of nondominant RCA and 40% stenosis proximal LAD. Echo showed EF 60-65%.  He was managed medically. He was lost to followup as an outpatient until 9/22.    Transferred from [place] to [facility] on 09/19/20 with anterior STEMI and cardiogenic shock. He was hypotensive on arrival and started on norepinephrine. Coronary angiogram demonstrated 90% thrombotic stenosis in mid LAD treated with PCI/DES, suspected embolic disease causing occlusion in distal LAD, chronic OM1 occlusion with collaterals, 90% proximal non-dominant RCA. RHC showed mean RA 9, PA 26/11, mean PCWP 16 with LVEDP 23, CI 2.46. Echo 9/22 with LVEF of 25-30%, WMA consistent with large LAD territory infarct, no LV thrombus, RV okay, RVSP 30 mmHg, no MR.  Diuresed with IV lasix. Initiated on GDMT with Entresto, empagliflozin, and spironolactone. BP too soft for beta blocker. LifeVest placed prior to discharge; discharge weight 220 lbs.     Entresto subsequently stopped and losartan started with soft BP.  At last visit with Dr. Aundra Dubin on 12/28/2020 he returned for HF followup. 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 He noted occasional chest pain, this was atypical with no trigger and does not last long.  He noted no dyspnea walking on flat ground but that his energy level was poor in general. Weight was up about 19 lbs and he was not not getting much exercise and eating poorly.  No lightheadedness, SBP was in the 120s and he was wearing his  Lifevest. REDS clip reading was 30%.  Today he returns to HF clinic for pharmacist medication titration. At last visit with MD losartan was stopped, and Entresto 24/26 mg BID was restarted. Labs showed elevated LDL and patient was referred to lipid clinic for Repatha initiation.   Overall he is feeling same and still has complaints of low energy. He reports some dizziness when getting up from bed, no lightheadedness. He reports some chest pain on the right side of his chest that lasts about a minute that does not appear related to his heart.   He reports his breathing is about the same as when he saw Dr. Aundra Dubin last, but that he is only able to work for 20-30 minutes before getting short of breath. He notes this decline took place in November or December. Weight at home is 279-283lbs per patient report and 274.9lbs in clinic today. He is adherent to a low-salt diet and cooks almost all his own food. No orthopnea or PND, but difficulty sleeping on his left side (but not right side or back) and trouble falling asleep for the past 2-3 months as he does not feel tired. He is taking his furosemide 20 mg daily. He gets his medications from the [place] in [place] and has been out of his empagliflozin for about 1 week and only has 6 days of Entresto left. REDS vest reading today 32%.   HF Medications: Entresto 24/26 mg BID Spironolactone 25 mg daily Empagliflozin 12.5 mg daily Furosemide 20 mg daily  Has  the patient been experiencing any side effects to the medications prescribed? No  Does the patient have any problems obtaining medications due to transportation or finances? Patient does not have insurance, but gets care and  medications from [place] in [place]. Patient had difficulty getting his medications delivered from the [place] instructed instructed to call when refills are needed. New prescriptions sent for 90 day supply. Samples provided today of Entresto and empagliflozin.   Understanding of regimen:  fair Understanding of indications: fair Potential of compliance: good Patient understands to avoid NSAIDs. Patient understands to avoid decongestants.    Pertinent Lab Values: Serum creatinine 0.61 , BUN 19, Potassium 4.4, Sodium 132, BNP 97.6, TC 252, LDL 161  Vital Signs: Weight: 274.9 lbs (last clinic weight: 273lbs) Blood pressure: 130/94 mm Hg  Heart rate: 77 bpm, 98% O2   Assessment/Plan: 1. CAD: History of unstable angina 5/19, medically managed (90% stenosis nondominant RCA).  Admitted with acute anterior MI in 9/22.  Cath with 90% thrombotic stenosis mid LAD, suspected embolic occlusion distal LAD, CTO OM1 with collaterals, 90% nondominant RCA.  Patient treated with DES to mid LAD.  He has only atypical chest pain.   - Continue ticagrelor and ASA 81.  - Crestor 40 mg daily. - Referred to lipid clinic for [place].   2. Chronic systolic CHF: Ischemic cardiomyopathy.  Echo in 9/22 showed EF 25-30%, LAD wall motion abnormalities, no LV thrombus, normal RV. Echo today showed EF 30% with apical aneurysm but no thrombus. NYHA class III symptoms, primarily due to fatigue.  - Increase Entresto 49/51 mg BID. 90 day prescription sent to the [place]. Samples given for 14 days.  - Continue Lasix 20 mg daily.   - Restart empagliflozin 12.5 mg daily. Instructed to call the [place]. 90 day prescription sent. Samples given for 14 days.  - Continue spironolactone 25 daily.   Defer addition of carvedilol due to worsening SOB since November/December. Despite stable weight and REDS vest reading patient had significant weight gain prior to last visit with Dr. Aundra Dubin concerning for fluid retention in stomach.   - EF is in ICD range, referred to EP.  He will have insurance per his report starting in January, but is still working with them for disability and his insurance. Keep on Lifevest until he has ICD.  - He has been referred to CR at [facility], should be able to participate when insurance is active. 3.  Diabetes: Followed at [facility]. 4. OSA: Strong suspicion.  Needs sleep study, hopefully can get this through the [place].    I am not sure he is going to be able to return to his current work after ICD placement due to strong magnet use, but I think with apical aneurysm that he needs ICD. Will have EP address this.  Follow up Dr. Aundra Dubin in 3 weeks. BMET at appointment to evaluate K and Scr with Entresto titration. Hopefully can add carvedilol then.  Cathrine Muster, PharmD, [place] PGY2 Cardiology Pharmacy Resident 01/17/2021  4:30 PM  Patient seen with Audry Riles.   Audry Riles, PharmD, BCPS, BCCP, CPP Heart Failure Clinic Pharmacist [phone]

## 2021-01-11 ENCOUNTER — Encounter (HOSPITAL_COMMUNITY): Payer: Self-pay | Admitting: *Deleted

## 2021-01-11 NOTE — Progress Notes (Signed)
Pt's disability form completed, signed by Dr Aundra Dubin, and faxed along with last OV and echo report to [facility] at [phone], pt aware this was done

## 2021-01-17 ENCOUNTER — Other Ambulatory Visit: Payer: Self-pay

## 2021-01-17 ENCOUNTER — Other Ambulatory Visit (HOSPITAL_COMMUNITY): Payer: Self-pay

## 2021-01-17 ENCOUNTER — Telehealth (HOSPITAL_COMMUNITY): Payer: Self-pay | Admitting: Pharmacist

## 2021-01-17 ENCOUNTER — Ambulatory Visit (HOSPITAL_COMMUNITY)
Admission: RE | Admit: 2021-01-17 | Discharge: 2021-01-17 | Disposition: A | Discharge status: Home / Self Care | Payer: Self-pay | Source: Ambulatory Visit | Attending: Cardiology | Admitting: Cardiology

## 2021-01-17 VITALS — BP 130/94 | HR 77 | Wt 274.9 lb

## 2021-01-17 DIAGNOSIS — R079 Chest pain, unspecified: Secondary | ICD-10-CM | POA: Insufficient documentation

## 2021-01-17 DIAGNOSIS — R42 Dizziness and giddiness: Secondary | ICD-10-CM | POA: Insufficient documentation

## 2021-01-17 DIAGNOSIS — I2510 Atherosclerotic heart disease of native coronary artery without angina pectoris: Secondary | ICD-10-CM | POA: Insufficient documentation

## 2021-01-17 DIAGNOSIS — I5022 Chronic systolic (congestive) heart failure: Secondary | ICD-10-CM | POA: Insufficient documentation

## 2021-01-17 DIAGNOSIS — Z7982 Long term (current) use of aspirin: Secondary | ICD-10-CM | POA: Insufficient documentation

## 2021-01-17 DIAGNOSIS — I255 Ischemic cardiomyopathy: Secondary | ICD-10-CM | POA: Insufficient documentation

## 2021-01-17 DIAGNOSIS — I252 Old myocardial infarction: Secondary | ICD-10-CM | POA: Insufficient documentation

## 2021-01-17 DIAGNOSIS — R0602 Shortness of breath: Secondary | ICD-10-CM | POA: Insufficient documentation

## 2021-01-17 DIAGNOSIS — I110 Hypertensive heart disease with heart failure: Secondary | ICD-10-CM | POA: Insufficient documentation

## 2021-01-17 DIAGNOSIS — E118 Type 2 diabetes mellitus with unspecified complications: Secondary | ICD-10-CM | POA: Insufficient documentation

## 2021-01-17 DIAGNOSIS — Z79899 Other long term (current) drug therapy: Secondary | ICD-10-CM | POA: Insufficient documentation

## 2021-01-17 LAB — BASIC METABOLIC PANEL
Anion gap: 12 (ref 5–15)
BUN: 12 mg/dL (ref 6–20)
CO2: 29 mmol/L (ref 22–32)
Calcium: 10.1 mg/dL (ref 8.9–10.3)
Chloride: 98 mmol/L (ref 98–111)
Creatinine, Ser: 0.64 mg/dL (ref 0.61–1.24)
GFR, Estimated: >60 mL/min (ref >60)
Glucose, Bld: 147 mg/dL — ABNORMAL HIGH (ref 70–99)
Potassium: 3.9 mmol/L (ref 3.5–5.1)
Sodium: 139 mmol/L (ref 135–145)

## 2021-01-17 MED ORDER — EMPAGLIFLOZIN 25 MG PO TABS: ORAL_TABLET | ORAL | 3 refills | Status: DC

## 2021-01-17 MED ORDER — SACUBITRIL-VALSARTAN 49-51 MG PO TABS: 1.0000 | ORAL_TABLET | Freq: Two times a day (BID) | ORAL | 3 refills | Status: DC

## 2021-01-17 NOTE — Patient Instructions (Addendum)
It was a pleasure seeing you today!  MEDICATIONS: -We are changing your medications today --Increase Entresto to 49/51 mg (1 tablet) twice daily. You may take 2 tablets of the 24/26 mg strength twice daily until you receive the new strength.  -Call if you have questions about your medications.  LABS: -We will call you if your labs need attention.  NEXT APPOINTMENT: Return to clinic in 3 weeks with Dr. Shirlee Latch.  In general, to take care of your heart failure: -Limit your fluid intake to 2 Liters (half-gallon) per day.   -Limit your salt intake to ideally 2-3 grams (2000-3000 mg) per day. -Weigh yourself daily and record, and bring that "weight diary" to your next appointment.  (Weight gain of 2-3 pounds in 1 day typically means fluid weight.) -The medications for your heart are to help your heart and help you live longer.   -Please contact us before stopping any of your heart medications.  Call the clinic at [phone] with questions or to reschedule future appointments.

## 2021-01-17 NOTE — Progress Notes (Signed)
ReDS Vest / Clip - 01/17/21 1300       ReDS Vest / Clip   Station Marker D    Ruler Value 37    ReDS Value Range Low volume    ReDS Actual Value 32

## 2021-01-17 NOTE — Progress Notes (Signed)
Advanced Heart Failure Clinic Note   [facility]P: [facility] [place] [facility] PCP-Cardiologist: Kristeen Miss, MD  HF Cardiologist: Dr. Shirlee Latch  HPI:  Tommy Liu is a 56 y.o. with history of CAD, HTN, type 2 diabetes, OSA but not on CPAP and ischemic CMP. Patient was admitted in 5/19 with unstable angina. Cath showed 90% proximal stenosis of nondominant RCA and 40% stenosis proximal LAD. Echo showed EF 60-65%.  He was managed medically. He was lost to followup as an outpatient until 9/22.    Transferred from [place] to [facility] on 09/19/20 with anterior STEMI and cardiogenic shock. He was hypotensive on arrival and started on norepinephrine. Coronary angiogram demonstrated 90% thrombotic stenosis in mid LAD treated with PCI/DES, suspected embolic disease causing occlusion in distal LAD, chronic OM1 occlusion with collaterals, 90% proximal non-dominant RCA. RHC showed mean RA 9, PA 26/11, mean PCWP 16 with LVEDP 23, CI 2.46. Echo 9/22 with LVEF of 25-30%, WMA consistent with large LAD territory infarct, no LV thrombus, RV okay, RVSP 30 mmHg, no MR.  Diuresed with IV lasix. Initiated on GDMT with Entresto, empagliflozin, and spironolactone. BP too soft for beta blocker. LifeVest placed prior to discharge; discharge weight 220 lbs.     Entresto subsequently stopped and losartan started with soft BP.  Echo 12/28/20 EF 30%, RV normal.   At last visit with Dr. Shirlee Latch on 12/28/2020 he returned for HF followup.  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 He noted occasional chest pain, this was atypical with no trigger and does not last long.  He noted no dyspnea walking on flat ground but that his energy level was poor in general. Weight was up about 19 lbs and he was not not getting much exercise and eating poorly.  No lightheadedness, SBP was in the 120s and he was wearing his Lifevest. REDS clip reading was 30%.  Today he returns to HF clinic for  pharmacist medication titration. At last visit with MD losartan was stopped, and Entresto 24/26 mg BID was restarted. Labs showed elevated LDL and patient was referred to lipid clinic for Repatha initiation. Overall he is feeling ok. Still complains of low energy. He reports occasional dizziness when getting up from bed, no lightheadedness. He reports some chest pain on the right side of his chest that lasts about a minute. This has no trigger. He reports his breathing is about the same as when he saw Dr. Shirlee Latch last, but thinks he is more SOB than 2 months ago.He is only able to work for 20-30 minutes before getting short of breath. He notes this decline took place in November or early December. Weight at home is 279-283lbs per patient report and 274.9 lbs in clinic today (up 1 lb from last office visit). No LEE, PND or orthopnea.  He is taking furosemide 20 mg daily and has not needed any extra. REDS vest reading today 32%. He is adherent to a low-salt diet and cooks almost all his own food. He gets his medications from the [place] in [place] and has been out of his empagliflozin for about 1 week and only has 6 days of Entresto left.   HF Medications: Entresto 24/26 mg BID Spironolactone 25 mg daily Empagliflozin 12.5 mg daily  Furosemide 20 mg daily  Has the patient been experiencing any side effects to the medications prescribed? No  Does the patient have any problems obtaining medications due to  transportation or finances? Patient does not have insurance, but gets care and  medications from [place] in [place]. Patient had difficulty getting his medications delivered from the [place] instructed instructed to call when refills are needed. New prescriptions sent for 90 day supply. Samples provided today of Entresto and empagliflozin.   Understanding of regimen: fair Understanding of indications: fair Potential of compliance: good Patient understands to avoid NSAIDs. Patient understands to avoid decongestants.     Pertinent Lab Values: 12/28/20: Serum creatinine 0.61 , BUN 19, Potassium 4.4, Sodium 132, BNP 97.6, TC 252, LDL 161 BMET today pending  Vital Signs: Weight: 274.9 lbs (last clinic weight: 273 lbs) Blood pressure: 130/94 mmHg  Heart rate: 77 bpm  Assessment/Plan: 1. CAD: History of unstable angina 5/19, medically managed (90% stenosis nondominant RCA).  Admitted with acute anterior MI in 9/22.  Cath with 90% thrombotic stenosis mid LAD, suspected embolic occlusion distal LAD, CTO OM1 with collaterals, 90% nondominant RCA.  Patient treated with DES to mid LAD.  He has only atypical chest pain.   - Continue ticagrelor and ASA 81.  - Crestor 40 mg daily. - Referred to lipid clinic for Repatha.   2. Chronic systolic CHF: Ischemic cardiomyopathy.  Echo in 09/2020 showed EF 25-30%, LAD wall motion abnormalities, no LV thrombus, normal RV. 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  - NYHA class III symptoms, euvolemic on exam. ReDS 32%. - BMET today pending.  - Continue Lasix 20 mg daily.   - Increase Entresto 49/51 mg BID. Repeat BMET in 3 weeks.  90 day prescription sent to the [place]. Samples given for 14 days.  - Restart empagliflozin 12.5 mg daily. [facility]. Samples given for 28 days.  - Continue spironolactone 25 mg daily.  - EF is in ICD range, referred to EP, visit scheduled for 01/28/21. Keep on Lifevest until he has ICD.  - He has been referred to CR at [facility], should be able to participate when insurance is active. 3. Diabetes: Followed at [facility]. 4. OSA: Strong suspicion.  Needs sleep study, hopefully can get this through the [place].     Follow up in HF Clinic with Dr. Shirlee Latch in 3 weeks.   Drake Leach, PharmD, BCPS PGY2 Cardiology Pharmacy Resident 01/17/2021  4:36 PM  Patient seen with Karle Plumber.   Karle Plumber, PharmD, BCPS, BCCP, CPP Heart Failure Clinic Pharmacist [phone]

## 2021-01-17 NOTE — Telephone Encounter (Signed)
Provided patient with Entresto samples.  Medication: Entresto 49/51 mg Quantity: 1 bottle  Lot: [id number] Expiration date: 07/2022  Provided patient with Jardiance samples.  Medication: Jardiance 25 mg Quantity: 2 bottles  Lot: [id number] Expiration date: 07/2021  Patient recieves medications through the [place]. He is aware that he will need to contact them for refills.   Karle Plumber, PharmD, BCPS, CPP Heart Failure Clinic Pharmacist [phone]

## 2021-01-18 ENCOUNTER — Ambulatory Visit (INDEPENDENT_AMBULATORY_CARE_PROVIDER_SITE_OTHER): Payer: Self-pay | Admitting: Pharmacist

## 2021-01-18 DIAGNOSIS — I25110 Atherosclerotic heart disease of native coronary artery with unstable angina pectoris: Secondary | ICD-10-CM

## 2021-01-18 DIAGNOSIS — I5022 Chronic systolic (congestive) heart failure: Secondary | ICD-10-CM

## 2021-01-18 MED ORDER — REPATHA SURECLICK 140 MG/ML ~~LOC~~ SOAJ: 1.0000 "pen " | SUBCUTANEOUS | 11 refills | Status: DC

## 2021-01-18 NOTE — Patient Instructions (Addendum)
I will send a prescription to the [place] for Repatha. Please let me know if there is any problem getting the medicine Please continue taking rosuvastatin 40mg  daily Call me at [phone] with any questions Please decrease your intake of sugary drinks like juice, tea and cool aid   [facility] Suicide & Crisis Lifeline Call or Text [phone]

## 2021-01-18 NOTE — Progress Notes (Signed)
Patient ID: Tommy Liu                 DOB: [birth year]                    MRN: [id number]     PCP: Center, [facility] PCP-Cardiologist: Mertie Moores, MD  HF Cardiologist: Dr. Aundra Dubin  HPI: Tommy Liu is a 56 y.o. male patient referred to lipid clinic by Dr. Aundra Dubin. PMH is significant for CAD, HTN, type 2 diabetes, OSA but not on CPAP and ischemic CMP.  Patient was admitted in 5/19 with unstable angina.  Cath showed 90% proximal stenosis of nondominant RCA and 40% stenosis proximal LAD.  Echo showed EF 60-65%.  He was managed medically.  He was lost to followup as an outpatient until 9/22 when he was transferred from [facility] with anterior STEMI and cardiogenic shock. EF 30%, wearing a life vest.  Patient presents today to lipid clinic. He expresses concern over his disability checks- hasn't received any short term disability checks in awhile. Concerned about paying his bills. Also applying for disability. Patient discloses issues with ED. States he has a medication at home that is not on his medlist that says he cant take with Cialis or Viagra. Does not know the name.   Patient also admits that he does have suicidal thoughts. He has never acted on them. Has not had any thoughts in a few weeks. He has trouble sleeping.  Gets his medications from the [place]. Send Rx to [facility] and then they typically mail them to him in 5-7 days.  Current Medications: rosuvastatin 40mg  Intolerances: none Risk Factors: premature CAD, progressive CAD, HTN, DM LDL goal: <55  Diet: cut back on salt  Sometimes he eats 1 time a day sometimes 3, doesn't always have an appetite Drink: orange juice, water, sweet tea, cool aid, milk  Exercise: walking in stores  Family History:  Family History  Problem Relation Age of Onset   Hypertension Father      Social History:  Social History   Socioeconomic History   Marital status: Divorced    Spouse name: Not on file   Number of  children: Not on file   Years of education: Not on file   Highest education level: Not on file  Occupational History   Not on file  Tobacco Use   Smoking status: Former    Packs/day: 1.00    Years: 30.00    Pack years: 30.00    Types: Cigarettes    Quit date: 01/02/1997    Years since quitting: 24.0   Smokeless tobacco: Former  Scientific laboratory technician Use: Never used  Substance and Sexual Activity   Alcohol use: Yes    Alcohol/week: 2.0 standard drinks    Types: 2 Cans of beer per week   Drug use: Never   Sexual activity: Not on file  Other Topics Concern   Not on file  Social History Narrative   Lives alone.  Still has support of ex-wife and other relatives   Social Determinants of Health   Financial Resource Strain: High Risk   Difficulty of Paying Living Expenses: Hard  Food Insecurity: No Food Insecurity   Worried About Charity fundraiser in the Last Year: Never true   Ran Out of Food in the Last Year: Never true  Transportation Needs: No Transportation Needs   Lack of Transportation (Medical): No   Lack of Transportation (Non-Medical): No  Physical Activity: Not on file  Stress: Not on file  Social Connections: Not on file  Intimate Partner Violence: Not on file     Labs: 12/28/20 TC 252, TG 161, HDL 52, LCL-C 168 (rosuvastatin 40mg ?)  Past Medical History:  Diagnosis Date   CAD in native artery    a. Cath 05/2017 -  90% pRCA (small & nondominant) and 40% pLAD >> medical mangment    Diabetes mellitus without complication (HCC)    Hypertension    Morbid obesity ([place])    Obstructive sleep apnea     Current Outpatient Medications on File Prior to Visit  Medication Sig Dispense Refill   acetaminophen (TYLENOL) 325 MG tablet Take 650 mg by mouth every 6 (six) hours as needed for moderate pain.     aspirin 81 MG EC tablet Take 1 tablet (81 mg total) by mouth daily. 30 tablet 11   empagliflozin (JARDIANCE) 25 MG TABS tablet Take 12.5 mg (1/2 tablet) daily 45 tablet  3   furosemide (LASIX) 20 MG tablet Take 1 tablet (20 mg total) by mouth daily. 90 tablet 1   glipiZIDE (GLUCOTROL) 10 MG tablet Take 10 mg by mouth 2 (two) times daily before a meal.      metFORMIN (GLUCOPHAGE) 500 MG tablet Take 1,000 mg by mouth 2 (two) times daily with a meal.      nitroGLYCERIN (NITROSTAT) 0.4 MG SL tablet Place 1 tablet (0.4 mg total) under the tongue every 5 (five) minutes x 3 doses as needed for chest pain. 25 tablet 0   rosuvastatin (CRESTOR) 40 MG tablet Take 1 tablet (40 mg total) by mouth daily. 30 tablet 1   sacubitril-valsartan (ENTRESTO) 49-51 MG Take 1 tablet by mouth 2 (two) times daily. 180 tablet 3   Semaglutide,0.25 or 0.5MG /DOS, 2 MG/1.5ML SOPN Inject 0.5 mg as directed once a week.     spironolactone (ALDACTONE) 25 MG tablet Take 1 tablet (25 mg total) by mouth at bedtime. 30 tablet 5   tamsulosin (FLOMAX) 0.4 MG CAPS capsule Take 1 capsule by mouth daily.     ticagrelor (BRILINTA) 90 MG TABS tablet Take 1 tablet (90 mg total) by mouth 2 (two) times daily. 60 tablet 11   No current facility-administered medications on file prior to visit.    Allergies  Allergen Reactions   Penicillins Anaphylaxis and Other (See Comments)    Has patient had a PCN reaction causing immediate rash, facial/tongue/throat swelling, SOB or lightheadedness with hypotension: Yes Has patient had a PCN reaction causing severe rash involving mucus membranes or skin necrosis: No Has patient had a PCN reaction that required hospitalization: Unknown Has patient had a PCN reaction occurring within the last 10 years: No If all of the above answers are "NO", then may proceed with Cephalosporin use.     Assessment/Plan:  1. Hyperlipidemia - LDL is above goal of <55. Patient states he has been compliant with rosuvastatin. Will start Repatha 140mg  q 14 days. Injection technique and dosing reviewed with patient. He is agreeable with plan. Rx sent to [facility]. Recheck labs in 3 months.  2. ED-  I explained to patient the interaction with medications like Cialis with his nitroglycerin and how he cannot take within 48 hr of each other. I explained that I cannot give advise on the medication he has at home because I don't know what it is, but it might be isosorbide and he cannot combined that with Cialis because it would bottom his blood pressure out.  I explained that his medications are really important for his heart and cannot be stopped.  2. Social concerns- I provided patient the # for the crisis hotline. I reminded him that we are here to help him and we will help get him through this. I told him to reach back out to [place] (social work) if he continues to struggle with his bills.    Thank you,   Ramond Dial, Pharm.D, BCPS, CPP [place]  [id number], [place], [place] [address]  Phone: [phone]; Fax: [phone]

## 2021-01-19 ENCOUNTER — Telehealth (HOSPITAL_COMMUNITY): Payer: Self-pay | Admitting: Licensed Clinical Social Worker

## 2021-01-19 NOTE — Telephone Encounter (Signed)
CSW informed pt has questions about disability.  CSW called pt to discuss.  Pt reports he had phone interview on 01/06/21 with [employer] and started application process- he is wondering if MD needs to sign a letter stating he is disabled for him to turn in.  CSW explained that disability would send [place] a medical records request so that we can send our office notes and testing to prove his disability so no letter was required.  CSW said that next steps would be they would send him lots of paperwork to fill out and sign so he needed to be monitoring his mail closely.  Encouraged pt to reach out if he has any questions.  Will continue to follow and assist as needed  Burna Sis, LCSW Clinical Social Worker [facility] Desk#: [phone] Cell#: [phone]

## 2021-01-24 ENCOUNTER — Other Ambulatory Visit: Payer: Self-pay

## 2021-01-24 ENCOUNTER — Ambulatory Visit (INDEPENDENT_AMBULATORY_CARE_PROVIDER_SITE_OTHER): Payer: Self-pay | Admitting: Internal Medicine

## 2021-01-24 ENCOUNTER — Encounter: Payer: Self-pay | Admitting: Internal Medicine

## 2021-01-24 VITALS — BP 110/78 | HR 76 | Ht 74.2 in | Wt 284.0 lb

## 2021-01-24 DIAGNOSIS — I25110 Atherosclerotic heart disease of native coronary artery with unstable angina pectoris: Secondary | ICD-10-CM

## 2021-01-24 DIAGNOSIS — I5022 Chronic systolic (congestive) heart failure: Secondary | ICD-10-CM

## 2021-01-24 DIAGNOSIS — Z01812 Encounter for preprocedural laboratory examination: Secondary | ICD-10-CM

## 2021-01-24 MED ORDER — CARVEDILOL 3.125 MG PO TABS: 3.1250 mg | ORAL_TABLET | Freq: Two times a day (BID) | ORAL | 3 refills | Status: DC

## 2021-01-24 NOTE — Progress Notes (Signed)
ELECTROPHYSIOLOGY CONSULT NOTE  Patient ID: Tommy Liu, MRN: [id number], DOB/AGE: Apr 19, 1965 56 y.o. Admit date: (Not on file) Date of Consult: 01/24/2021  [facility] Primary Cardiologist: DM     Tommy Liu is a 56 y.o. male who is being seen today for the evaluation of ICD at the request of DM.    HPI Tommy Liu is a 56 y.o. male referred for consideration of an ICD in setting of ischemic cardiomyopathy.  Known CAD 2019 and then anterior STEMI 9/22 cx by cardiogenic shock  RX with PCI/DES Discharged with LifeVest  Continues with mild dyspnea.  Stable two-pillow orthopnea.  Some peripheral edema.  No lightheadedness.  No tachypalpitations.  Remote syncope.  Untreated sleep apnea.  Sleep study pending.  Guideline directed therapy except not on beta-blockers because of low blood pressure in the past  Morbid obesity.  Lives alone.  No children.  Currently not working.   DATE TEST EF   5/19 LHC 60-65% LADp 40; RCAnondom-90  9/22 LHC   % LADm 90; LADd-T;CxOM1-T RCAnondom-90  9/22 Echo  25-30%   12/22 Echo 30% aneurysm   Date Cr K Hgb  1/23 0.66 4.4 15.9          DM and HTN     Past Medical History:  Diagnosis Date   CAD in native artery    a. Cath 05/2017 -  90% pRCA (small & nondominant) and 40% pLAD >> medical mangment    Diabetes mellitus without complication (HCC)    Hypertension    Morbid obesity (HCC)    Obstructive sleep apnea       Surgical History:  Past Surgical History:  Procedure Laterality Date   CORONARY STENT INTERVENTION N/A 09/19/2020   Procedure: CORONARY STENT INTERVENTION;  Surgeon: Marykay LexHarding, David W, MD;  [facility];  Service: Cardiovascular;  Laterality: N/A;   CORONARY/GRAFT ACUTE MI REVASCULARIZATION N/A 09/19/2020   Procedure: Coronary/Graft Acute MI Revascularization;  Surgeon: Marykay LexHarding, David W, MD;  [facility];  Service: Cardiovascular;   Laterality: N/A;   LEFT HEART CATH AND CORONARY ANGIOGRAPHY N/A 05/07/2017   Procedure: LEFT HEART CATH AND CORONARY ANGIOGRAPHY;  Surgeon: [place]azil[place]Jordan, Peter M, MD;  [facility];  Service: Cardiovascular;  Laterality: N/A;   LEFT HEART CATH AND CORONARY ANGIOGRAPHY N/A 09/19/2020   Procedure: LEFT HEART CATH AND CORONARY ANGIOGRAPHY;  Surgeon: Marykay LexHarding, David W, MD;  [facility];  Service: Cardiovascular;  Laterality: N/A;   RIGHT HEART CATH N/A 09/19/2020   Procedure: RIGHT HEART CATH;  Surgeon: Marykay LexHarding, David W, MD;  [facility];  Service: Cardiovascular;  Laterality: N/A;   ULTRASOUND GUIDANCE FOR VASCULAR ACCESS  05/07/2017   Procedure: Ultrasound Guidance For Vascular Access;  Surgeon: [place]Jordan, Peter M, MD;  [facility];  Service: Cardiovascular;;     Home Meds: Current Meds  Medication Sig   acetaminophen (TYLENOL) 325 MG tablet Take 650 mg by mouth every 6 (six) hours as needed for moderate pain.   aspirin 81 MG EC tablet Take 1 tablet (81 mg total) by mouth daily.   empagliflozin (JARDIANCE) 25 MG TABS tablet Take 12.5 mg (1/2 tablet) daily   Evolocumab (REPATHA SURECLICK) 140 MG/ML SOAJ Inject 1 pen into the skin every 14 (fourteen) days.   furosemide (LASIX) 20 MG tablet Take 1 tablet (20 mg total) by mouth daily.   glipiZIDE (  GLUCOTROL) 10 MG tablet Take 10 mg by mouth 2 (two) times daily before a meal.    metFORMIN (GLUCOPHAGE) 500 MG tablet Take 1,000 mg by mouth 2 (two) times daily with a meal.    nitroGLYCERIN (NITROSTAT) 0.4 MG SL tablet Place 1 tablet (0.4 mg total) under the tongue every 5 (five) minutes x 3 doses as needed for chest pain.   rosuvastatin (CRESTOR) 40 MG tablet Take 1 tablet (40 mg total) by mouth daily.   sacubitril-valsartan (ENTRESTO) 49-51 MG Take 1 tablet by mouth 2 (two) times daily.   Semaglutide,0.25 or 0.5MG /DOS, 2 MG/1.5ML SOPN Inject 0.5 mg as directed once a week.   spironolactone  (ALDACTONE) 25 MG tablet Take 1 tablet (25 mg total) by mouth at bedtime.   tamsulosin (FLOMAX) 0.4 MG CAPS capsule Take 1 capsule by mouth daily.   ticagrelor (BRILINTA) 90 MG TABS tablet Take 1 tablet (90 mg total) by mouth 2 (two) times daily.    Allergies:  Allergies  Allergen Reactions   Penicillins Anaphylaxis and Other (See Comments)    Has patient had a PCN reaction causing immediate rash, facial/tongue/throat swelling, SOB or lightheadedness with hypotension: Yes Has patient had a PCN reaction causing severe rash involving mucus membranes or skin necrosis: No Has patient had a PCN reaction that required hospitalization: Unknown Has patient had a PCN reaction occurring within the last 10 years: No If all of the above answers are "NO", then may proceed with Cephalosporin use.     Social History   Socioeconomic History   Marital status: Divorced    Spouse name: Not on file   Number of children: Not on file   Years of education: Not on file   Highest education level: Not on file  Occupational History   Not on file  Tobacco Use   Smoking status: Former    Packs/day: 1.00    Years: 30.00    Pack years: 30.00    Types: Cigarettes    Quit date: 01/02/1997    Years since quitting: 24.0   Smokeless tobacco: Former  Building services engineer Use: Never used  Substance and Sexual Activity   Alcohol use: Yes    Alcohol/week: 2.0 standard drinks    Types: 2 Cans of beer per week   Drug use: Never   Sexual activity: Not on file  Other Topics Concern   Not on file  Social History Narrative   Lives alone.  Still has support of ex-wife and other relatives   Social Determinants of Health   Financial Resource Strain: High Risk   Difficulty of Paying Living Expenses: Hard  Food Insecurity: No Food Insecurity   Worried About Programme researcher, broadcasting/film/video in the Last Year: Never true   Ran Out of Food in the Last Year: Never true  Transportation Needs: No Transportation Needs   Lack of  Transportation (Medical): No   Lack of Transportation (Non-Medical): No  Physical Activity: Not on file  Stress: Not on file  Social Connections: Not on file  Intimate Partner Violence: Not on file     Family History  Problem Relation Age of Onset   Hypertension Father      ROS:  Please see the history of present illness.     All other systems reviewed and negative.    Physical Exam: Blood pressure 110/78, pulse 76, height 6' 2.2" (1.885 m), weight 284 lb (128.8 kg), SpO2 97 %. General: Well developed, Morbidly obese  male in  no acute distress. Head: Normocephalic, atraumatic, sclera non-icteric, no xanthomas, nares are without discharge. EENT: normal  Lymph Nodes:  none Neck: Negative for carotid bruits. JVD not elevated. Back:without scoliosis kyphosis Lungs: Clear bilaterally to auscultation without wheezes, rales, or rhonchi. Breathing is unlabored. Heart: RRR with S1 S2. Nomurmur . No rubs, or gallops appreciated. Abdomen: Soft, non-tender, non-distended with normoactive bowel sounds. No hepatomegaly. No rebound/guarding. No obvious abdominal masses. Msk:  Strength and tone appear normal for age. Extremities: No clubbing or cyanosis. No edema.  Distal pedal pulses are 2+ and equal bilaterally. Skin: Warm and Dry Neuro: Alert and oriented X 3. CN III-XII intact Grossly normal sensory and motor function . Psych:  Responds to questions appropriately with a normal affect.       EKG: sinus at 76 Intervals 15/11/41 Q waves 2 3 and F V1-V6      Assessment and Plan:  Ischemic cardiomyopathy with an aneurysm  Congestive heart failure-chronic-systolic class IIb-3A  Morbid obesity  Diabetes poorly controlled (hemoglobin A1c greater than 11)  Sleep apnea-untreated  Hyperlipidemia   Patient has persistent left ventricular dysfunction with modest congestive cardiomyopathy in the setting of significant anterior wall MI   he is appropriately considered for ICD for primary  prevention for sudden cardiac death.  We reviewed the potential risks including but not limited to death perforation infection bleeding and lead dislodgment, he understands these risks and is willing to proceed.  Continue his Entresto 49/51, Aldactone 25 discussed with Dr. DM regarding initiation of low-dose carvedilol.  Continue his Jardiance.  He needs a repeat hemoglobin A1c which may be down on his SGLT2  Have encouraged him also and the importance of of efforts to lose weight.  Encouraged him to follow through on a sleep study and to follow-up with his primary care physician regarding his diabetes  Recently started on Repatha because of LDL far above goal  Sherryl Manges

## 2021-01-24 NOTE — H&P (View-Only) (Signed)
° ° ° ° °ELECTROPHYSIOLOGY CONSULT NOTE  °Patient ID: Tommy Liu, MRN: [id number], DOB/AGE: 01/10/1965 56 y.o. °Admit date: (Not on file) °Date of Consult: 01/24/2021 ° °Primary Physician: [facility] °Primary Cardiologist: DM °  °  °Tommy Liu is a 56 y.o. male who is being seen today for the evaluation of ICD at the request of DM.  ° ° °HPI °Tommy Liu is a 56 y.o. male referred for consideration of an ICD in setting of ischemic cardiomyopathy. ° °Known CAD 2019 and then anterior STEMI 9/22 cx by cardiogenic shock  RX with PCI/DES °Discharged with LifeVest ° °Continues with mild dyspnea.  Stable two-pillow orthopnea.  Some peripheral edema.  No lightheadedness.  No tachypalpitations.  Remote syncope. ° °Untreated sleep apnea.  Sleep study pending. ° °Guideline directed therapy except not on beta-blockers because of low blood pressure in the past ° °Morbid obesity. ° °Lives alone.  No children.  Currently not working. ° ° °DATE TEST EF   °5/19 LHC 60-65% LADp 40; RCAnondom-90  °9/22 LHC   % LADm 90; LADd-T;CxOM1-T °RCAnondom-90  °9/22 Echo  25-30%   °12/22 Echo 30% aneurysm  ° °Date Cr K Hgb  °1/23 0.66 4.4 15.9  °      ° ° DM and HTN  ° ° ° °Past Medical History:  °Diagnosis Date  ° CAD in native artery   ° a. Cath 05/2017 -  90% pRCA (small & nondominant) and 40% pLAD >> medical mangment   ° Diabetes mellitus without complication (HCC)   ° Hypertension   ° Morbid obesity (HCC)   ° Obstructive sleep apnea   °   ° °Surgical History:  °Past Surgical History:  °Procedure Laterality Date  ° CORONARY STENT INTERVENTION N/A 09/19/2020  ° Procedure: CORONARY STENT INTERVENTION;  Surgeon: Harding, David W, MD;  [facility];  Service: Cardiovascular;  Laterality: N/A;  ° CORONARY/GRAFT ACUTE MI REVASCULARIZATION N/A 09/19/2020  ° Procedure: Coronary/Graft Acute MI Revascularization;  Surgeon: Harding, David W, MD;  [facility]: [facility];  Service: Cardiovascular;   Laterality: N/A;  ° LEFT HEART CATH AND CORONARY ANGIOGRAPHY N/A 05/07/2017  ° Procedure: LEFT HEART CATH AND CORONARY ANGIOGRAPHY;  Surgeon: Jordan, Peter M, MD;  [facility]: [facility];  Service: Cardiovascular;  Laterality: N/A;  ° LEFT HEART CATH AND CORONARY ANGIOGRAPHY N/A 09/19/2020  ° Procedure: LEFT HEART CATH AND CORONARY ANGIOGRAPHY;  Surgeon: Harding, David W, MD;  [facility];  Service: Cardiovascular;  Laterality: N/A;  ° RIGHT HEART CATH N/A 09/19/2020  ° Procedure: RIGHT HEART CATH;  Surgeon: Harding, David W, MD;  [facility];  Service: Cardiovascular;  Laterality: N/A;  ° ULTRASOUND GUIDANCE FOR VASCULAR ACCESS  05/07/2017  ° Procedure: Ultrasound Guidance For Vascular Access;  Surgeon: Jordan, Peter M, MD;  [facility];  Service: Cardiovascular;;  °  ° °Home Meds: °Current Meds  °Medication Sig  ° acetaminophen (TYLENOL) 325 MG tablet Take 650 mg by mouth every 6 (six) hours as needed for moderate pain.  ° aspirin 81 MG EC tablet Take 1 tablet (81 mg total) by mouth daily.  ° empagliflozin (JARDIANCE) 25 MG TABS tablet Take 12.5 mg (1/2 tablet) daily  ° Evolocumab (REPATHA SURECLICK) 140 MG/ML SOAJ Inject 1 pen into the skin every 14 (fourteen) days.  ° furosemide (LASIX) 20 MG tablet Take 1 tablet (20 mg total) by mouth daily.  ° glipiZIDE (  GLUCOTROL) 10 MG tablet Take 10 mg by mouth 2 (two) times daily before a meal.    metFORMIN (GLUCOPHAGE) 500 MG tablet Take 1,000 mg by mouth 2 (two) times daily with a meal.    nitroGLYCERIN (NITROSTAT) 0.4 MG SL tablet Place 1 tablet (0.4 mg total) under the tongue every 5 (five) minutes x 3 doses as needed for chest pain.   rosuvastatin (CRESTOR) 40 MG tablet Take 1 tablet (40 mg total) by mouth daily.   sacubitril-valsartan (ENTRESTO) 49-51 MG Take 1 tablet by mouth 2 (two) times daily.   Semaglutide,0.25 or 0.5MG /DOS, 2 MG/1.5ML SOPN Inject 0.5 mg as directed once a week.   spironolactone  (ALDACTONE) 25 MG tablet Take 1 tablet (25 mg total) by mouth at bedtime.   tamsulosin (FLOMAX) 0.4 MG CAPS capsule Take 1 capsule by mouth daily.   ticagrelor (BRILINTA) 90 MG TABS tablet Take 1 tablet (90 mg total) by mouth 2 (two) times daily.    Allergies:  Allergies  Allergen Reactions   Penicillins Anaphylaxis and Other (See Comments)    Has patient had a PCN reaction causing immediate rash, facial/tongue/throat swelling, SOB or lightheadedness with hypotension: Yes Has patient had a PCN reaction causing severe rash involving mucus membranes or skin necrosis: No Has patient had a PCN reaction that required hospitalization: Unknown Has patient had a PCN reaction occurring within the last 10 years: No If all of the above answers are "NO", then may proceed with Cephalosporin use.     Social History   Socioeconomic History   Marital status: Divorced    Spouse name: Not on file   Number of children: Not on file   Years of education: Not on file   Highest education level: Not on file  Occupational History   Not on file  Tobacco Use   Smoking status: Former    Packs/day: 1.00    Years: 30.00    Pack years: 30.00    Types: Cigarettes    Quit date: 01/02/1997    Years since quitting: 24.0   Smokeless tobacco: Former  Building services engineer Use: Never used  Substance and Sexual Activity   Alcohol use: Yes    Alcohol/week: 2.0 standard drinks    Types: 2 Cans of beer per week   Drug use: Never   Sexual activity: Not on file  Other Topics Concern   Not on file  Social History Narrative   Lives alone.  Still has support of ex-wife and other relatives   Social Determinants of Health   Financial Resource Strain: High Risk   Difficulty of Paying Living Expenses: Hard  Food Insecurity: No Food Insecurity   Worried About Programme researcher, broadcasting/film/video in the Last Year: Never true   Ran Out of Food in the Last Year: Never true  Transportation Needs: No Transportation Needs   Lack of  Transportation (Medical): No   Lack of Transportation (Non-Medical): No  Physical Activity: Not on file  Stress: Not on file  Social Connections: Not on file  Intimate Partner Violence: Not on file     Family History  Problem Relation Age of Onset   Hypertension Father      ROS:  Please see the history of present illness.     All other systems reviewed and negative.    Physical Exam: Blood pressure 110/78, pulse 76, height 6' 2.2" (1.885 m), weight 284 lb (128.8 kg), SpO2 97 %. General: Well developed, Morbidly obese  male in  no acute distress. Head: Normocephalic, atraumatic, sclera non-icteric, no xanthomas, nares are without discharge. EENT: normal  Lymph Nodes:  none Neck: Negative for carotid bruits. JVD not elevated. Back:without scoliosis kyphosis Lungs: Clear bilaterally to auscultation without wheezes, rales, or rhonchi. Breathing is unlabored. Heart: RRR with S1 S2. Nomurmur . No rubs, or gallops appreciated. Abdomen: Soft, non-tender, non-distended with normoactive bowel sounds. No hepatomegaly. No rebound/guarding. No obvious abdominal masses. Msk:  Strength and tone appear normal for age. Extremities: No clubbing or cyanosis. No edema.  Distal pedal pulses are 2+ and equal bilaterally. Skin: Warm and Dry Neuro: Alert and oriented X 3. CN III-XII intact Grossly normal sensory and motor function . Psych:  Responds to questions appropriately with a normal affect.       EKG: sinus at 76 Intervals 15/11/41 Q waves 2 3 and F V1-V6      Assessment and Plan:  Ischemic cardiomyopathy with an aneurysm  Congestive heart failure-chronic-systolic class IIb-3A  Morbid obesity  Diabetes poorly controlled (hemoglobin A1c greater than 11)  Sleep apnea-untreated  Hyperlipidemia   Patient has persistent left ventricular dysfunction with modest congestive cardiomyopathy in the setting of significant anterior wall MI   he is appropriately considered for ICD for primary  prevention for sudden cardiac death.  We reviewed the potential risks including but not limited to death perforation infection bleeding and lead dislodgment, he understands these risks and is willing to proceed.  Continue his Entresto 49/51, Aldactone 25 discussed with Dr. DM regarding initiation of low-dose carvedilol.  Continue his Jardiance.  He needs a repeat hemoglobin A1c which may be down on his SGLT2  Have encouraged him also and the importance of of efforts to lose weight.  Encouraged him to follow through on a sleep study and to follow-up with his primary care physician regarding his diabetes  Recently started on Repatha because of LDL far above goal  Sherryl Manges

## 2021-01-24 NOTE — Patient Instructions (Signed)
Medication Instructions:  Your physician recommends that you continue on your current medications as directed. Please refer to the Current Medication list given to you today.  *If you need a refill on your cardiac medications before your next appointment, please call your pharmacy*   Lab Work: CBC and BMET today  If you have labs (blood work) drawn today and your tests are completely normal, you will receive your results only by: . MyChart Message (if you have MyChart) OR . A paper copy in the mail If you have any lab test that is abnormal or we need to change your treatment, we will call you to review the results.   Testing/Procedures: Your physician has recommended that you have a defibrillator inserted. An implantable cardioverter defibrillator (ICD) is a small device that is placed in your chest or, in rare cases, your abdomen. This device uses electrical pulses or shocks to help control life-threatening, irregular heartbeats that could lead the heart to suddenly stop beating (sudden cardiac arrest). Leads are attached to the ICD that goes into your heart. This is done in the hospital and usually requires an overnight stay. Please see the instruction sheet given to you today for more information.     Follow-Up: At [facility], you and your health needs are our priority.  As part of our continuing mission to provide you with exceptional heart care, we have created designated Provider Care Teams.  These Care Teams include your primary Cardiologist (physician) and Advanced Practice Providers (APPs -  Physician Assistants and Nurse Practitioners) who all work together to provide you with the care you need, when you need it.  We recommend signing up for the patient portal called "MyChart".  Sign up information is provided on this After Visit Summary.  MyChart is used to connect with patients for Virtual Visits (Telemedicine).  Patients are able to view lab/test results, encounter notes,  upcoming appointments, etc.  Non-urgent messages can be sent to your provider as well.   To learn more about what you can do with MyChart, go to [email or web address].    Your next appointment:   To be scheduled   

## 2021-01-25 LAB — CBC
Hematocrit: 47.9 % (ref 37.5–51.0)
Hemoglobin: 15.9 g/dL (ref 13.0–17.7)
MCH: 29.2 pg (ref 26.6–33.0)
MCHC: 33.2 g/dL (ref 31.5–35.7)
MCV: 88 fL (ref 79–97)
Platelets: 229 10*3/uL (ref 150–450)
RBC: 5.44 x10E6/uL (ref 4.14–5.80)
RDW: 13.6 % (ref 11.6–15.4)
WBC: 10.1 10*3/uL (ref 3.4–10.8)

## 2021-01-25 LAB — BASIC METABOLIC PANEL
BUN/Creatinine Ratio: 21 — ABNORMAL HIGH (ref 9–20)
BUN: 14 mg/dL (ref 6–24)
CO2: 26 mmol/L (ref 20–29)
Calcium: 9.6 mg/dL (ref 8.7–10.2)
Chloride: 102 mmol/L (ref 96–106)
Creatinine, Ser: 0.66 mg/dL — ABNORMAL LOW (ref 0.76–1.27)
Glucose: 123 mg/dL — ABNORMAL HIGH (ref 70–99)
Potassium: 4.4 mmol/L (ref 3.5–5.2)
Sodium: 140 mmol/L (ref 134–144)
eGFR: 111 mL/min/{1.73_m2} (ref >59)

## 2021-01-27 ENCOUNTER — Telehealth: Payer: Self-pay

## 2021-01-27 NOTE — Telephone Encounter (Signed)
Spoke with pt and advised per Dr Graciela Husbands.  Labs are normal.  Pt verbalizes understanding and thanked Charity fundraiser for the call.

## 2021-01-27 NOTE — Telephone Encounter (Signed)
-----   Message from Duke Salvia, MD sent at 01/26/2021  5:57 PM EST ----- Please Inform Patient that labs are normal  Thanks

## 2021-01-28 ENCOUNTER — Institutional Professional Consult (permissible substitution): Payer: Self-pay | Admitting: Internal Medicine

## 2021-02-08 ENCOUNTER — Ambulatory Visit (HOSPITAL_COMMUNITY)
Admission: RE | Admit: 2021-02-08 | Discharge: 2021-02-08 | Disposition: A | Discharge status: Home / Self Care | Payer: Medicaid Other | Source: Ambulatory Visit | Attending: Cardiology | Admitting: Cardiology

## 2021-02-08 ENCOUNTER — Encounter (HOSPITAL_COMMUNITY): Payer: Self-pay | Admitting: Cardiology

## 2021-02-08 ENCOUNTER — Other Ambulatory Visit: Payer: Self-pay

## 2021-02-08 VITALS — BP 140/80 | HR 72 | Wt 284.8 lb

## 2021-02-08 DIAGNOSIS — Z79899 Other long term (current) drug therapy: Secondary | ICD-10-CM | POA: Insufficient documentation

## 2021-02-08 DIAGNOSIS — I252 Old myocardial infarction: Secondary | ICD-10-CM | POA: Insufficient documentation

## 2021-02-08 DIAGNOSIS — I110 Hypertensive heart disease with heart failure: Secondary | ICD-10-CM | POA: Insufficient documentation

## 2021-02-08 DIAGNOSIS — E119 Type 2 diabetes mellitus without complications: Secondary | ICD-10-CM | POA: Insufficient documentation

## 2021-02-08 DIAGNOSIS — I5022 Chronic systolic (congestive) heart failure: Secondary | ICD-10-CM | POA: Diagnosis not present

## 2021-02-08 DIAGNOSIS — Z955 Presence of coronary angioplasty implant and graft: Secondary | ICD-10-CM | POA: Diagnosis not present

## 2021-02-08 DIAGNOSIS — I253 Aneurysm of heart: Secondary | ICD-10-CM | POA: Diagnosis not present

## 2021-02-08 DIAGNOSIS — Z7982 Long term (current) use of aspirin: Secondary | ICD-10-CM | POA: Diagnosis not present

## 2021-02-08 DIAGNOSIS — G4733 Obstructive sleep apnea (adult) (pediatric): Secondary | ICD-10-CM | POA: Insufficient documentation

## 2021-02-08 DIAGNOSIS — I255 Ischemic cardiomyopathy: Secondary | ICD-10-CM | POA: Insufficient documentation

## 2021-02-08 DIAGNOSIS — I25110 Atherosclerotic heart disease of native coronary artery with unstable angina pectoris: Secondary | ICD-10-CM | POA: Diagnosis present

## 2021-02-08 DIAGNOSIS — Z7902 Long term (current) use of antithrombotics/antiplatelets: Secondary | ICD-10-CM | POA: Diagnosis not present

## 2021-02-08 DIAGNOSIS — Z7984 Long term (current) use of oral hypoglycemic drugs: Secondary | ICD-10-CM | POA: Diagnosis not present

## 2021-02-08 MED ORDER — CARVEDILOL 3.125 MG PO TABS: 6.2500 mg | ORAL_TABLET | Freq: Two times a day (BID) | ORAL | 3 refills | Status: DC

## 2021-02-08 NOTE — Patient Instructions (Signed)
Medication Changes:  Increase your Carvedilol to 6.25mg  Twice daily   Lab Work:  Get blood work done in 1 month  Testing/Procedures:  Your physician has recommended that you have a sleep study. This test records several body functions during sleep, including: brain activity, eye movement, oxygen and carbon dioxide blood levels, heart rate and rhythm, breathing rate and rhythm, the flow of air through your mouth and nose, snoring, body muscle movements, and chest and belly movement. [facility]  [facility]  [facility] cardiac rehab. They will call you to arrange your appointment   Clinic pharmacist  Special Instructions // Education:  none  Follow-Up in: 2 months  At the [facility], you and your health needs are our priority. We have a designated team specialized in the treatment of Heart Failure. This Care Team includes your primary Heart Failure Specialized Cardiologist (physician), Advanced Practice Providers (APPs- Physician Assistants and Nurse Practitioners), and Pharmacist who all work together to provide you with the care you need, when you need it.   You may see any of the following providers on your designated Care Team at your next follow up:  Dr Glori Bickers Dr Haynes Kerns, NP Lyda Jester, [place] [facility] [place], [place] Audry Riles, PharmD   Please be sure to bring in all your medications bottles to every appointment.   Need to Contact us:  If you have any questions or concerns before your next appointment please send [place] a message through [place] or call our office at [phone].    TO LEAVE A MESSAGE FOR THE NURSE SELECT OPTION 2, PLEASE LEAVE A MESSAGE INCLUDING: YOUR NAME DATE OF BIRTH CALL BACK NUMBER REASON FOR CALL**this is important as we prioritize the call backs  YOU WILL RECEIVE A CALL BACK THE SAME DAY AS LONG AS YOU CALL BEFORE 4:00 PM

## 2021-02-08 NOTE — Progress Notes (Signed)
CSW consulted to speak with pt regarding disability case.  Has applied for long term disability through [place]- told him to keep an eye out for mail from them and complete in timely manner to keep up with this application.  Was mainly concerned about Short term disability through his work.  I assisted him in calling disability agency and confirming what they need- they need list of restrictions and updated medical notes- CSW will fax to them once notes from todays appts are in.  Will continue to follow and assist as needed  Burna Sis, LCSW Clinical Social Worker Advanced [facility] Failure Clinic Desk#: [phone] Cell#: [phone]

## 2021-02-08 NOTE — Progress Notes (Signed)
Advanced Heart Failure Clinic Note    PCP: [facility] [place] PCP-Cardiologist: Mertie Moores, MD  HF Cardiologist: Dr. Coralee Rud Reetz is a 56 y.o. with history of CAD, HTN, type 2 diabetes, OSA but not on CPAP and ischemic CMP presents for followup of CAD and CHF.  Patient was admitted in 5/19 with unstable angina.  Cath showed 90% proximal stenosis of nondominant RCA and 40% stenosis proximal LAD.  Echo showed EF 60-65%.  He was managed medically.  He was lost to followup as an outpatient until 9/22.    Transferred from [facility] to [facility] on 09/19/20 with anterior STEMI and cardiogenic shock. He was hypotensive on arrival and started on norepinephrine. Coronary angiogram demonstrated 90% thrombotic stenosis in mid LAD treated with PCI/DES, suspected embolic disease causing occlusion in distal LAD, chronic OM1 occlusion with collaterals, 90% proximal non-dominant RCA. RHC showed mean RA 9, PA 26/11, mean PCWP 16 with LVEDP 23, CI 2.46. Echo 9/22 with LVEF of 25-30%, WMA consistent with large LAD territory infarct, no LV thrombus, RV okay, RVSP 30 mmHg, no MR.  Diuresed with IV lasix. Initiated on GDMT with Entresto, empagliflozin, and spironolactone. BP too soft for beta blocker. LifeVest placed prior to discharge; discharge weight 220 lbs.    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   Today he returns for HF followup.  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  He works around Art gallery manager.  He is wearing his Lifevest. Weight is up about 10 lbs.  He is occasionally short of breath with heavy lifting.  Able to walk in [employer] with no problems.  No chest pain.  No orthopnea/PND.  No chest pain.  He has just started on Repatha.   Labs (9/22): LDL 105 Labs (11/22): K 3.8, creatinine 0.64 Labs (1/23): K 4.4, creatinine 0.66  PMH: 1. HTN 2. Type 2 diabetes 3. OSA: Does not have CPAP.  4. CAD: Unstable  angina in 5/19, cath with 90% proximal nondominant small RCA.  - 9/22 anterior STEMI: 90% thrombotic mid LAD stenosis treated with DES, total occlusion of distal LAD likely by embolic event, totally occluded OM1 with collaterals, 90% proximal stenosis nondominant RCA.  5. Chronic systolic CHF: Ischemic cardiomyopathy.   - Echo (9/22): EF 25-30%, LAD territory WMAs, RV ok.  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    Current Outpatient Medications  Medication Sig Dispense Refill   acetaminophen (TYLENOL) 325 MG tablet Take 650 mg by mouth every 6 (six) hours as needed for moderate pain.     aspirin 81 MG EC tablet Take 1 tablet (81 mg total) by mouth daily. 30 tablet 11   empagliflozin (JARDIANCE) 25 MG TABS tablet Take 12.5 mg (1/2 tablet) daily 45 tablet 3   Evolocumab (REPATHA SURECLICK) XX123456 MG/ML SOAJ Inject 1 pen into the skin every 14 (fourteen) days. 2 mL 11   furosemide (LASIX) 20 MG tablet Take 1 tablet (20 mg total) by mouth daily. 90 tablet 1   glipiZIDE (GLUCOTROL) 10 MG tablet Take 10 mg by mouth 2 (two) times daily before a meal.      metFORMIN (GLUCOPHAGE) 500 MG tablet Take 1,000 mg by mouth 2 (two) times daily with a meal.      nitroGLYCERIN (NITROSTAT) 0.4 MG SL tablet Place 1 tablet (0.4 mg total) under the tongue every 5 (five) minutes x 3  doses as needed for chest pain. 25 tablet 0   rosuvastatin (CRESTOR) 40 MG tablet Take 1 tablet (40 mg total) by mouth daily. 30 tablet 1   sacubitril-valsartan (ENTRESTO) 49-51 MG Take 1 tablet by mouth 2 (two) times daily. 180 tablet 3   Semaglutide,0.25 or 0.5MG /DOS, 2 MG/1.5ML SOPN Inject 0.5 mg as directed once a week.     spironolactone (ALDACTONE) 25 MG tablet Take 1 tablet (25 mg total) by mouth at bedtime. 30 tablet 5   tamsulosin (FLOMAX) 0.4 MG CAPS capsule Take 1 capsule by mouth daily.     ticagrelor (BRILINTA) 90 MG TABS tablet Take 1 tablet (90 mg total) by mouth 2 (two) times daily. 60  tablet 11   carvedilol (COREG) 3.125 MG tablet Take 2 tablets (6.25 mg total) by mouth 2 (two) times daily. 180 tablet 3   No current facility-administered medications for this encounter.   Allergies  Allergen Reactions   Penicillins Anaphylaxis and Other (See Comments)    Has patient had a PCN reaction causing immediate rash, facial/tongue/throat swelling, SOB or lightheadedness with hypotension: Yes Has patient had a PCN reaction causing severe rash involving mucus membranes or skin necrosis: No Has patient had a PCN reaction that required hospitalization: Unknown Has patient had a PCN reaction occurring within the last 10 years: No If all of the above answers are "NO", then may proceed with Cephalosporin use.    Social History   Socioeconomic History   Marital status: Divorced    Spouse name: Not on file   Number of children: Not on file   Years of education: Not on file   Highest education level: Not on file  Occupational History   Not on file  Tobacco Use   Smoking status: Former    Packs/day: 1.00    Years: 30.00    Pack years: 30.00    Types: Cigarettes    Quit date: 01/02/1997    Years since quitting: 24.1   Smokeless tobacco: Former  Scientific laboratory technician Use: Never used  Substance and Sexual Activity   Alcohol use: Yes    Alcohol/week: 2.0 standard drinks    Types: 2 Cans of beer per week   Drug use: Never   Sexual activity: Not on file  Other Topics Concern   Not on file  Social History Narrative   Lives alone.  Still has support of ex-wife and other relatives   Social Determinants of Health   Financial Resource Strain: High Risk   Difficulty of Paying Living Expenses: Hard  Food Insecurity: No Food Insecurity   Worried About Charity fundraiser in the Last Year: Never true   Ran Out of Food in the Last Year: Never true  Transportation Needs: No Transportation Needs   Lack of Transportation (Medical): No   Lack of Transportation (Non-Medical): No   Physical Activity: Not on file  Stress: Not on file  Social Connections: Not on file  Intimate Partner Violence: Not on file   Family History  Problem Relation Age of Onset   Hypertension Father    BP 140/80    Pulse 72    Wt 129.2 kg (284 lb 12.8 oz)    SpO2 97%    BMI 36.37 kg/m   Wt Readings from Last 3 Encounters:  02/08/21 129.2 kg (284 lb 12.8 oz)  01/24/21 128.8 kg (284 lb)  01/17/21 124.7 kg (274 lb 14.4 oz)   PHYSICAL EXAM: General: NAD Neck: No JVD, no  thyromegaly or thyroid nodule.  Lungs: Clear to auscultation bilaterally with normal respiratory effort. CV: Nondisplaced PMI.  Heart regular S1/S2, no S3/S4, no murmur.  No peripheral edema.  No carotid bruit.  Normal pedal pulses.  Abdomen: Soft, nontender, no hepatosplenomegaly, no distention.  Skin: Intact without lesions or rashes.  Neurologic: Alert and oriented x 3.  Psych: Normal affect. Extremities: No clubbing or cyanosis.  HEENT: Normal.   ASSESSMENT & PLAN: 1. CAD: History of unstable angina 5/19, medically managed (90% stenosis nondominant RCA).  Admitted with acute anterior MI in 9/22.  Cath with 90% thrombotic stenosis mid LAD, suspected embolic occlusion distal LAD, CTO OM1 with collaterals, 90% nondominant RCA.  Patient treated with DES to mid LAD.  No chest pain.  - Continue ticagrelor and ASA 81.  - Crestor 40 mg daily + Repatha.  2. Chronic systolic CHF: Ischemic cardiomyopathy.  Echo in 9/22 showed EF 25-30%, LAD wall motion abnormalities, no LV thrombus, normal RV. Echo in 12/22 showed EF 30% with apical aneurysm but no thrombus.  Weight is up significantly but does not appear significantly volume overloaded on exam.  NYHA class II symptoms, primarily due to fatigue.  - Continue Entresto 49/51 bid.  Recent BMET stable.   - Continue Lasix 20 mg daily.   - Continue empagliflozin 12.5 mg daily.  - Continue spironolactone 25 daily.  - Increase Coreg to 6.25 mg bid.   - He will be getting an ICD later  this month.  Keep on Lifevest until he has ICD.  - He has been referred to CR at [facility].  3. Diabetes: Followed at [facility]. 4. OSA: Strong suspicion.  Needs sleep study, hopefully can get this through the [place].   I am not sure he is going to be able to return to his current work after ICD placement due to strong magnet use, but I think with apical aneurysm that he needs ICD. Will keep him out of work until after ICD placed. Followup with HF pharmacist in 3 wks for med titration then see APP in 2 months.    Loralie Champagne, MD 02/08/21

## 2021-02-09 ENCOUNTER — Telehealth (HOSPITAL_COMMUNITY): Payer: Self-pay | Admitting: Licensed Clinical Social Worker

## 2021-02-09 NOTE — Telephone Encounter (Signed)
Progress notes sent to pt short term disability worker to have pt approved for continued STD benefits.  Will continue to follow and assist as needed  Burna Sis, LCSW Clinical Social Worker [facility] Clinic Desk#: [phone] Cell#: [phone]

## 2021-02-14 NOTE — Progress Notes (Incomplete)
***In Progress***   Advanced Heart Failure Clinic Note   PCP: [facility] PCP-Cardiologist: Kristeen Miss, MD  HF Cardiologist: Dr. Shirlee Latch  HPI:  Tommy Liu is a 56 y.o. with history of CAD, HTN, type 2 diabetes, OSA but not on CPAP and ischemic CMP. Patient was admitted in 5/19 with unstable angina. Cath showed 90% proximal stenosis of nondominant RCA and 40% stenosis proximal LAD. Echo showed EF 60-65%.  He was managed medically. He was lost to followup as an outpatient until 9/22.    Transferred from [place] to [facility] on 09/19/20 with anterior STEMI and cardiogenic shock. He was hypotensive on arrival and started on norepinephrine. Coronary angiogram demonstrated 90% thrombotic stenosis in mid LAD treated with PCI/DES, suspected embolic disease causing occlusion in distal LAD, chronic OM1 occlusion with collaterals, 90% proximal non-dominant RCA. RHC showed mean RA 9, PA 26/11, mean PCWP 16 with LVEDP 23, CI 2.46. Echo 9/22 with LVEF of 25-30%, WMA consistent with large LAD territory infarct, no LV thrombus, RV okay, RVSP 30 mmHg, no MR.  Diuresed with IV lasix. Initiated on GDMT with Entresto, empagliflozin, and spironolactone. BP too soft for beta blocker. LifeVest placed prior to discharge; discharge weight 220 lbs.    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   Presented to AHF Clinic with Dr. Shirlee Latch on 02/08/21 for HF followup.  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  He was wearing his Lifevest. Weight was up about 10 lbs.  He reported occasionally getting short of breath with heavy lifting.  Able to walk in [employer] with no problems.  No chest pain.  No orthopnea/PND.  No chest pain.  He had just started on Repatha.     Today he returns to HF clinic for pharmacist medication titration. At last visit with MD, carvedilol was increased to 6.25 mg  BID.   HF Medications: Carvedilol 6.25 mg BID Entresto 24/26 mg BID Spironolactone 25 mg daily Empagliflozin 12.5 mg daily  Furosemide 20 mg daily  Has the patient been experiencing any side effects to the medications prescribed? No  Does the patient have any problems obtaining medications due to transportation or finances? Patient does not have insurance, but gets care and  medications from [place] in [place]. Patient had difficulty getting his medications delivered from the [place] instructed instructed to call when refills are needed. New prescriptions sent for 90 day supply. Samples provided today of Entresto and empagliflozin.   Understanding of regimen: fair Understanding of indications: fair Potential of compliance: good Patient understands to avoid NSAIDs. Patient understands to avoid decongestants.    Pertinent Lab Values: 12/28/20: Serum creatinine 0.61 , BUN 19, Potassium 4.4, Sodium 132, BNP 97.6, TC 252, LDL 161 BMET today pending  Vital Signs: Weight: 274.9 lbs (last clinic weight: 273 lbs) Blood pressure: 130/94 mmHg  Heart rate: 77 bpm  Assessment/Plan: 1. CAD: History of unstable angina 05/2017, medically managed (90% stenosis nondominant RCA).  Admitted with acute anterior MI in 09/2020.  Cath with 90% thrombotic stenosis mid LAD, suspected embolic occlusion distal LAD, CTO OM1 with collaterals, 90% nondominant RCA.  Patient treated with DES to mid LAD.  No chest pain.  - Continue ticagrelor and ASA 81.  - Continue Crestor 40 mg daily and Repatha  2. Chronic systolic CHF: Ischemic cardiomyopathy.  Echo in 09/2020 showed EF 25-30%, LAD wall  motion abnormalities, no LV thrombus, normal RV. 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  - NYHA class II symptoms, primarily due to fatigue. Weight is up significantly but does not appear significantly volume overloaded on exam*** - Continue Lasix 20 mg daily.  - Continue carvedilol 6.25 mg BID  - Continue  Entresto 49/51 mg BID. - Continue empagliflozin 12.5 mg daily.  - Continue spironolactone 25 mg daily.  - He will be getting an ICD later this month.  Keep on Lifevest until he has ICD.  - He has been referred to CR at [facility], should be able to participate when insurance is active. 3. Diabetes: Followed at [facility]. 4. OSA: Strong suspicion.  Needs sleep study, hopefully can get this through the [place].     Follow up ***   Karle Plumber, PharmD, BCPS, BCCP, CPP Heart Failure Clinic Pharmacist [phone]

## 2021-02-16 ENCOUNTER — Ambulatory Visit (HOSPITAL_COMMUNITY)
Admission: RE | Admit: 2021-02-16 | Discharge: 2021-02-16 | Disposition: A | Discharge status: Home / Self Care | Payer: Medicaid Other | Attending: Internal Medicine | Admitting: Internal Medicine

## 2021-02-16 ENCOUNTER — Ambulatory Visit (HOSPITAL_COMMUNITY): Payer: Medicaid Other

## 2021-02-16 ENCOUNTER — Ambulatory Visit (HOSPITAL_COMMUNITY)
Admission: RE | Disposition: A | Discharge status: Home / Self Care | Payer: Self-pay | Source: Home / Self Care | Attending: Internal Medicine

## 2021-02-16 ENCOUNTER — Other Ambulatory Visit: Payer: Self-pay

## 2021-02-16 DIAGNOSIS — I2510 Atherosclerotic heart disease of native coronary artery without angina pectoris: Secondary | ICD-10-CM | POA: Diagnosis not present

## 2021-02-16 DIAGNOSIS — E785 Hyperlipidemia, unspecified: Secondary | ICD-10-CM | POA: Diagnosis not present

## 2021-02-16 DIAGNOSIS — Z959 Presence of cardiac and vascular implant and graft, unspecified: Secondary | ICD-10-CM

## 2021-02-16 DIAGNOSIS — I5022 Chronic systolic (congestive) heart failure: Secondary | ICD-10-CM | POA: Diagnosis not present

## 2021-02-16 DIAGNOSIS — Z7984 Long term (current) use of oral hypoglycemic drugs: Secondary | ICD-10-CM | POA: Insufficient documentation

## 2021-02-16 DIAGNOSIS — Z6837 Body mass index (BMI) 37.0-37.9, adult: Secondary | ICD-10-CM | POA: Insufficient documentation

## 2021-02-16 DIAGNOSIS — Z7985 Long-term (current) use of injectable non-insulin antidiabetic drugs: Secondary | ICD-10-CM | POA: Insufficient documentation

## 2021-02-16 DIAGNOSIS — Z87891 Personal history of nicotine dependence: Secondary | ICD-10-CM | POA: Insufficient documentation

## 2021-02-16 DIAGNOSIS — I255 Ischemic cardiomyopathy: Secondary | ICD-10-CM | POA: Diagnosis present

## 2021-02-16 DIAGNOSIS — I110 Hypertensive heart disease with heart failure: Secondary | ICD-10-CM | POA: Insufficient documentation

## 2021-02-16 DIAGNOSIS — E1165 Type 2 diabetes mellitus with hyperglycemia: Secondary | ICD-10-CM | POA: Diagnosis not present

## 2021-02-16 DIAGNOSIS — G4733 Obstructive sleep apnea (adult) (pediatric): Secondary | ICD-10-CM | POA: Insufficient documentation

## 2021-02-16 DIAGNOSIS — I252 Old myocardial infarction: Secondary | ICD-10-CM | POA: Diagnosis not present

## 2021-02-16 HISTORY — PX: ICD IMPLANT: EP1208

## 2021-02-16 LAB — GLUCOSE, CAPILLARY
Glucose-Capillary: 226 mg/dL — ABNORMAL HIGH (ref 70–99)
Glucose-Capillary: 184 mg/dL — ABNORMAL HIGH (ref 70–99)

## 2021-02-16 SURGERY — ICD IMPLANT

## 2021-02-16 MED ORDER — ACETAMINOPHEN 325 MG PO TABS
325.0000 mg | ORAL_TABLET | ORAL | Status: DC | PRN
  Administered 2021-02-16: 650 mg via ORAL
  Filled 2021-02-16 (×3): qty 2

## 2021-02-16 MED ORDER — VANCOMYCIN HCL IN DEXTROSE 1-5 GM/200ML-% IV SOLN: 1000.0000 mg | INTRAVENOUS | Status: DC

## 2021-02-16 MED ORDER — SODIUM CHLORIDE 0.9 % IV SOLN: INTRAVENOUS | Status: DC

## 2021-02-16 MED ORDER — POVIDONE-IODINE 10 % EX SWAB
2.0000 "application " | Freq: Once | CUTANEOUS | Status: AC
  Administered 2021-02-16: 2 via TOPICAL

## 2021-02-16 MED ORDER — VANCOMYCIN HCL 1500 MG/300ML IV SOLN
1500.0000 mg | INTRAVENOUS | Status: AC
  Administered 2021-02-16: 1500 mg via INTRAVENOUS
  Filled 2021-02-16: qty 300

## 2021-02-16 MED ORDER — FENTANYL CITRATE (PF) 100 MCG/2ML IJ SOLN
INTRAMUSCULAR | Status: AC
Start: 2021-02-16 — End: ?
  Filled 2021-02-16: qty 2

## 2021-02-16 MED ORDER — LIDOCAINE HCL 1 % IJ SOLN
INTRAMUSCULAR | Status: AC
Start: 2021-02-16 — End: ?
  Filled 2021-02-16: qty 20

## 2021-02-16 MED ORDER — HEPARIN (PORCINE) IN NACL 1000-0.9 UT/500ML-% IV SOLN
INTRAVENOUS | Status: DC | PRN
  Administered 2021-02-16: 500 mL

## 2021-02-16 MED ORDER — MIDAZOLAM HCL 5 MG/5ML IJ SOLN
INTRAMUSCULAR | Status: DC | PRN
  Administered 2021-02-16: 2 mg via INTRAVENOUS
  Administered 2021-02-16: 1 mg via INTRAVENOUS

## 2021-02-16 MED ORDER — CHLORHEXIDINE GLUCONATE 4 % EX LIQD
4.0000 "application " | Freq: Once | CUTANEOUS | Status: DC
  Filled 2021-02-16: qty 60

## 2021-02-16 MED ORDER — SODIUM CHLORIDE 0.9 % IV SOLN
80.0000 mg | INTRAVENOUS | Status: AC
  Administered 2021-02-16: 80 mg

## 2021-02-16 MED ORDER — MIDAZOLAM HCL 5 MG/5ML IJ SOLN
INTRAMUSCULAR | Status: AC
  Filled 2021-02-16: qty 5

## 2021-02-16 MED ORDER — LIDOCAINE HCL 1 % IJ SOLN
INTRAMUSCULAR | Status: AC
  Filled 2021-02-16: qty 20

## 2021-02-16 MED ORDER — LIDOCAINE HCL (PF) 1 % IJ SOLN
INTRAMUSCULAR | Status: DC | PRN
  Administered 2021-02-16: 60 mL

## 2021-02-16 MED ORDER — HEPARIN (PORCINE) IN NACL 1000-0.9 UT/500ML-% IV SOLN
INTRAVENOUS | Status: AC
  Filled 2021-02-16: qty 500

## 2021-02-16 MED ORDER — FENTANYL CITRATE (PF) 100 MCG/2ML IJ SOLN
INTRAMUSCULAR | Status: DC | PRN
Start: 2021-02-16 — End: 2021-02-16
  Administered 2021-02-16: 25 ug via INTRAVENOUS
  Administered 2021-02-16: 50 ug via INTRAVENOUS

## 2021-02-16 MED ORDER — SODIUM CHLORIDE 0.9 % IV SOLN
INTRAVENOUS | Status: AC
  Filled 2021-02-16: qty 2

## 2021-02-16 MED ORDER — ONDANSETRON HCL 4 MG/2ML IJ SOLN: 4.0000 mg | Freq: Four times a day (QID) | INTRAMUSCULAR | Status: DC | PRN

## 2021-02-16 SURGICAL SUPPLY — 9 items
CABLE SURGICAL S-101-97-12 (CABLE) ×2 IMPLANT
HEMOSTAT SURGICEL 2X4 FIBR (HEMOSTASIS) ×1 IMPLANT
ICD MOMENTUM D120 (ICD Generator) ×1 IMPLANT
LEAD RELIANCE 0138-64 (Lead) ×1 IMPLANT
MAT PREVALON FULL STRYKER (MISCELLANEOUS) ×1 IMPLANT
PAD DEFIB RADIO PHYSIO CONN (PAD) ×2 IMPLANT
SHEATH 9.5FR PRELUDE SNAP 13 (SHEATH) ×1 IMPLANT
SHEATH 9FR PRELUDE SNAP 13 (SHEATH) IMPLANT
TRAY PACEMAKER INSERTION (PACKS) ×2 IMPLANT

## 2021-02-16 NOTE — Interval H&P Note (Signed)
History and Physical Interval Note:  02/16/2021 12:54 PM  Tommy Liu  has presented today for surgery, with the diagnosis of cardiomyopathy.  The various methods of treatment have been discussed with the patient and family. After consideration of risks, benefits and other options for treatment, the patient has consented to  Procedure(s): ICD IMPLANT (N/A) as a surgical intervention.  The patient's history has been reviewed, patient examined, no change in status, stable for surgery.  I have reviewed the patient's chart and labs.  Questions were answered to the patient's satisfaction.     Sherryl Manges

## 2021-02-16 NOTE — Discharge Instructions (Signed)
° ° °  Supplemental Discharge Instructions for  Pacemaker/Defibrillator Patients  Tomorrow, 02/17/21, send in a device transmission  Activity No heavy lifting or vigorous activity with your left/right arm for 6 to 8 weeks.  Do not raise your left/right arm above your head for one week.  Gradually raise your affected arm as drawn below.              02/21/21                    02/22/21                   02/23/21                   02/24/21 __  NO DRIVING for  1 week   ; you may begin driving on   02/04/53  .  WOUND CARE Keep the wound area clean and dry.  Do not get this area wet , no showers for 24 hours; you may shower on  02/17/21 evening  . Tomorrow, 02/17/21, remove the arm sling Dr. Graciela Husbands used DERMABOND to close your wound.  DO NOT peel this off.  Do not rub/scrub the area, pat dry. No bandage is needed on the site.  DO  NOT apply any creams, oils, or ointments to the wound area. If you notice any drainage or discharge from the wound, any swelling or bruising at the site, or you develop a fever > 101? F after you are discharged home, call the office at once.  Special Instructions You are still able to use cellular telephones; use the ear opposite the side where you have your pacemaker/defibrillator.  Avoid carrying your cellular phone near your device. When traveling through airports, show security personnel your identification card to avoid being screened in the metal detectors.  Ask the security personnel to use the hand wand. Avoid arc welding equipment, MRI testing (magnetic resonance imaging), TENS units (transcutaneous nerve stimulators).  Call the office for questions about other devices. Avoid electrical appliances that are in poor condition or are not properly grounded. Microwave ovens are safe to be near or to operate.  Additional information for defibrillator patients should your device go off: If your device goes off ONCE and you feel fine afterward, notify the device clinic  nurses. If your device goes off ONCE and you do not feel well afterward, call 911. If your device goes off TWICE, call 911. If your device goes off THREE times in one day, call 911.  DO NOT DRIVE YOURSELF OR A FAMILY MEMBER WITH A DEFIBRILLATOR TO THE HOSPITAL--CALL 911.

## 2021-02-17 ENCOUNTER — Encounter (HOSPITAL_COMMUNITY): Payer: Self-pay | Admitting: Internal Medicine

## 2021-02-17 MED FILL — Lidocaine HCl Local Preservative Free (PF) Inj 1%: INTRAMUSCULAR | Qty: 30 | Status: AC

## 2021-02-18 ENCOUNTER — Telehealth: Payer: Self-pay

## 2021-02-18 NOTE — Telephone Encounter (Signed)
-----   Message from Sheilah Pigeon, [place] sent at 02/16/2021  3:40 PM EST ----- Same day d/c today SK [employer]Sci ICD

## 2021-02-18 NOTE — Telephone Encounter (Signed)
Follow-up after same day discharge: Implant date: 02/16/21 MD: Sherryl Manges, MD Device: [facility] ICD Location: Left Chest   Wound check visit: 03/02/21 at 11:20 90 day MD follow-up: 05/19/21 at 2:15  Remote Transmission received: No- patient admits to not setting up provided transmitter. Strongly encouraged to do so today when he returns home  Dressing removed: N/A Dermabond remains intact   Successful telephone encounter to patient to follow up post ICD implant. Patient states he is doing well, 'just a little sore". Sling has been removed however he wears intermittently when out of the home to protect his chest. No swelling, redness, bruising, or bleeding noted. Appointment confirmed as above. Patient is reminded of driving and arm restrictions. He is provided device clinic contact for additional questions or concerns that may arise.

## 2021-02-23 ENCOUNTER — Institutional Professional Consult (permissible substitution): Payer: MEDICAID | Admitting: Neurology

## 2021-02-23 ENCOUNTER — Encounter: Payer: Self-pay | Admitting: Neurology

## 2021-03-01 ENCOUNTER — Telehealth (HOSPITAL_COMMUNITY): Payer: Self-pay | Admitting: Vascular Surgery

## 2021-03-01 ENCOUNTER — Inpatient Hospital Stay (HOSPITAL_COMMUNITY): Admission: RE | Admit: 2021-03-01 | Payer: Self-pay | Source: Ambulatory Visit

## 2021-03-01 ENCOUNTER — Telehealth (HOSPITAL_COMMUNITY): Payer: Self-pay | Admitting: Licensed Clinical Social Worker

## 2021-03-01 NOTE — Telephone Encounter (Signed)
Called pt to resch pham appt today

## 2021-03-01 NOTE — Telephone Encounter (Signed)
Patient arrived for cancelled appointment today. Patient was upset because of the cost of gas and he missed the call to cancel his visit. CSW was able to assist patient with a visa gift card to replace the gas and get him back home. Patient grateful for the support and assistance. Lasandra Beech, LCSW, CCSW-MCS [phone]

## 2021-03-02 ENCOUNTER — Other Ambulatory Visit: Payer: Self-pay

## 2021-03-02 ENCOUNTER — Ambulatory Visit (INDEPENDENT_AMBULATORY_CARE_PROVIDER_SITE_OTHER): Payer: Self-pay

## 2021-03-02 DIAGNOSIS — I5022 Chronic systolic (congestive) heart failure: Secondary | ICD-10-CM

## 2021-03-02 LAB — CUP PACEART INCLINIC DEVICE CHECK
Brady Statistic RV Percent Paced: 1 % — CL
Date Time Interrogation Session: 20230301115806
HighPow Impedance: 71 Ohm
Implantable Lead Implant Date: 20230215
Implantable Lead Location: 753860
Implantable Lead Model: 138
Implantable Lead Serial Number: 302859
Implantable Pulse Generator Implant Date: 20230215
Lead Channel Impedance Value: 517 Ohm
Lead Channel Pacing Threshold Amplitude: 1 V
Lead Channel Pacing Threshold Pulse Width: 0.4 ms
Lead Channel Sensing Intrinsic Amplitude: 20.3 mV
Lead Channel Setting Pacing Amplitude: 3.5 V
Lead Channel Setting Pacing Pulse Width: 0.4 ms
Lead Channel Setting Sensing Sensitivity: 0.5 mV
Pulse Gen Serial Number: 215773

## 2021-03-02 NOTE — Patient Instructions (Signed)
? ?  After Your ICD ?(Implantable Cardiac Defibrillator) ? ? ? ?Monitor your defibrillator site for redness, swelling, and drainage. Call the device clinic at [phone] if you experience these symptoms or fever/chills. ? ?Your incision was closed with Dermabond:  You may shower 1 day after your defibrillator implant and wash your incision with soap and water. Avoid lotions, ointments, or perfumes over your incision until it is well-healed. ? ?You may use a hot tub or a pool after your wound check appointment if the incision is completely closed. ? ?Do not lift, push or pull greater than 10 pounds with the affected arm until 6 weeks after your procedure. There are no other restrictions in arm movement after your wound check appointment. ? ?Your ICD is designed to protect you from life threatening heart rhythms. Because of this, you may receive a shock.  ? ?1 shock with no symptoms:  Call the office during business hours. ?1 shock with symptoms (chest pain, chest pressure, dizziness, lightheadedness, shortness of breath, overall feeling unwell):  Call 911. ?If you experience 2 or more shocks in 24 hours:  Call 911. ?If you receive a shock, you should not drive.  ?[place] DMV - no driving for 6 months if you receive appropriate therapy from your ICD.  ? ?ICD Alerts:  Some alerts are vibratory and others beep. These are NOT emergencies. Please call our office to let us know. If this occurs at night or on weekends, it can wait until the next business day. Send a remote transmission. ? ?If your device is capable of reading fluid status (for heart failure), you will be offered monthly monitoring to review this with you.  ? ?Remote monitoring is used to monitor your ICD from home. This monitoring is scheduled every 91 days by our office. It allows us to keep an eye on the functioning of your device to ensure it is working properly. You will routinely see your Electrophysiologist annually (more often if necessary).  ?

## 2021-03-02 NOTE — Progress Notes (Addendum)
Wound check appointment. Steri-strips removed. Wound without redness or edema. Incision edges approximated, wound well healed. Normal device function. Thresholds, sensing, and impedances consistent with implant measurements. Device programmed at 3.5V for extra safety margin until 3 month visit. Histogram distribution appropriate for patient and level of activity. No ventricular arrhythmias noted. Patient educated about wound care, arm mobility, lifting restrictions, shock plan. ROV in 3 months with implanting physician. Work note provided to patient, myself spoke to Dr. Graciela Husbands in regard to specific restrictions on work note.  ? ?Attempted to faxed work note [employer] at patient request. Each time received "Busy". Patient called and made aware to see if he can work on getting it to them. Patient agreeable and states he will call us back if he needs anything further.   ?Fax #: [phone] ?Placed in bin for scan into medical record. ?

## 2021-03-08 ENCOUNTER — Encounter: Payer: Self-pay | Admitting: Allergy

## 2021-03-08 ENCOUNTER — Ambulatory Visit (INDEPENDENT_AMBULATORY_CARE_PROVIDER_SITE_OTHER): Payer: No Typology Code available for payment source | Admitting: Allergy

## 2021-03-08 ENCOUNTER — Other Ambulatory Visit: Payer: Self-pay

## 2021-03-08 VITALS — BP 116/78 | HR 70 | Resp 16 | Ht 71.5 in | Wt 288.2 lb

## 2021-03-08 DIAGNOSIS — T50905D Adverse effect of unspecified drugs, medicaments and biological substances, subsequent encounter: Secondary | ICD-10-CM

## 2021-03-08 DIAGNOSIS — T45525A Adverse effect of antithrombotic drugs, initial encounter: Secondary | ICD-10-CM

## 2021-03-08 DIAGNOSIS — T45525D Adverse effect of antithrombotic drugs, subsequent encounter: Secondary | ICD-10-CM

## 2021-03-08 NOTE — Patient Instructions (Signed)
Penicillin allergy ?-Reported penicillin allergy from childhood ?-Discussed at this time since several decades have passed that he is less likely to be allergic to penicillin ?-Recommendation at this time will be to proceed with a in office graded drug challenge ?-He has been scheduled later this month for an office penicillin drug challenge which we will supply the medication ?-Advise he should hold all antihistamines for 3 days prior to this challenge today ?

## 2021-03-08 NOTE — Progress Notes (Incomplete)
***In Progress***    Advanced Heart Failure Clinic Note   [facility]P: [facility] PCP-Cardiologist: Kristeen Miss, MD  HF Cardiologist: Dr. Shirlee Latch  HPI: Tommy Liu is a 56 y.o. with history of CAD, HTN, type 2 diabetes, OSA but not on CPAP and ischemic CMP presents for followup of CAD and CHF.    Patient was admitted in 05/2017 with unstable angina.  Cath showed 90% proximal stenosis of nondominant RCA and 40% stenosis proximal LAD.  Echo showed EF 60-65%.  He was managed medically.  He was lost to followup as an outpatient until 09/2020.    Transferred from [place] to [facility] on 09/19/2020 with anterior STEMI and cardiogenic shock. He was hypotensive on arrival and started on norepinephrine. Coronary angiogram demonstrated 90% thrombotic stenosis in mid LAD treated with PCI/DES, suspected embolic disease causing occlusion in distal LAD, chronic OM1 occlusion with collaterals, 90% proximal non-dominant RCA. RHC showed mean RA 9, PA 26/11, mean PCWP 16 with LVEDP 23, CI 2.46. Echo on 09/2020 showed LVEF of 25-30%, WMA consistent with large LAD territory infarct, no LV thrombus, RV okay, RVSP 30 mmHg, no MR. Diuresed with IV lasix. Initiated on GDMT with Entresto, empagliflozin, and spironolactone. BP too soft for beta blocker. LifeVest placed prior to discharge; discharge weight was 220 lbs.     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  *** On 01/24/2021 visit, low dose carvedilol was added.   *** On 02/16/2021, he was successfully implanted with ICD.    He was last seen by MD in AHF clinic on 02/08/2021 . He was wearing Lifevest and his weight was up around 10 lbs. He reported occasionally being short of breath with heavy lifting. He stated he is able to walk around [employer] with no problems. Denied chest pain, orthopnea, PND. He was recently started on Repatha. 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    Today, he returns to HF clinic for pharmacist medication titration. At last visit with APP, carvedilol was increased to 6.25 mg BID.   Overall feeling ***. Dizziness, lightheadedness, fatigue:  Chest pain or palpitations:  How is your breathing?: *** SOB: Able to complete all ADLs. Activity level ***  Weight at home pounds. Takes furosemide/torsemide/bumex *** mg *** daily.  LEE PND/Orthopnea  Appetite *** Low-salt diet:   Physical Exam Cost/affordability of meds   HF Medications: Carvedilol 6.25 mg BID Entresto 49-51 mg BID Spironolactone 25 mg daily Jardiance 12.5 mg daily Furosemide 20 mg daily ICD implant   Has the patient been experiencing any side effects to the medications prescribed?  {YES NO:22349}  Does the patient have any problems obtaining medications due to transportation or finances?   {YES NO:22349}  Understanding of regimen: {excellent/good/fair/poor:19665} Understanding of indications: {excellent/good/fair/poor:19665} Potential of compliance: {excellent/good/fair/poor:19665} Patient understands to avoid NSAIDs. Patient understands to avoid decongestants.    Pertinent Lab Values: Serum creatinine ***, BUN ***, Potassium ***, Sodium ***, BNP ***, Magnesium ***, Digoxin ***   Vital Signs: Weight: *** (last clinic weight: ***) Blood pressure: ***  Heart rate: ***   Assessment/Plan: 1. CAD: History of unstable angina 05/2017, medically managed (90% stenosis nondominant RCA).  Admitted with acute anterior MI in 09/2020.  Cath with 90% thrombotic stenosis mid LAD, suspected embolic occlusion distal LAD, CTO OM1 with collaterals, 90% nondominant RCA.  Patient treated with DES to mid LAD.  No chest pain.  - Continue ticagrelor and ASA 81.  - Crestor 40 mg daily + Repatha.  2. Chronic systolic CHF: Ischemic cardiomyopathy.  Echo in 09/2020 showed EF 25-30%, LAD wall motion abnormalities, no LV thrombus, normal RV. 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  ***Weight is up significantly but does not appear significantly volume overloaded on exam.  ***NYHA class II symptoms, primarily due to fatigue.  - Continue Lasix 20 mg daily.   - ***Continue/increase Entresto 97-103 bid. *** - Continue spironolactone 25 daily.  - Continue empagliflozin 12.5 mg daily.  - ***Continue/Increase Coreg to 6.25 mg bid.   - Patient has ICD implant - He has been previously referred to CR at [facility].  3. Diabetes: Followed at [facility]. 4. OSA: Strong suspicion.  Needs sleep study, hopefully can get this through the [place].    ***I am not sure he is going to be able to return to his current work after ICD placement due to strong magnet use, but I think with apical aneurysm that he needs ICD. Will keep him out of work until after ICD placed. Followup with HF pharmacist in 3 wks for med titration then see APP in on 04/08/2021  Follow up ***   Karle Plumber, PharmD, BCPS, BCCP, CPP Heart Failure Clinic Pharmacist [phone]

## 2021-03-08 NOTE — Progress Notes (Signed)
? ? ?New Patient Note ? ?RE: Tommy Liu MRN: [id number] DOB: 03-17-1965 ?Date of Office Visit: 03/08/2021 ? ? ?Primary care provider: Wendi Maya, MD ? ?Chief Complaint: penicillin allergy ? ?History of present illness: ?Tommy Liu is a 56 y.o. male presenting today for evaluation of drug allergy. ? ?He states as a child he got a penicillin shot and this would have been about 47 years ago. He states his mother told him he had an allergic reaction and may have had throat swelling.    He has been avoiding ever since. He states multiple of his family members (including mother, sibling) all had listed penicillin allergy as well.  ?He would like to know at this time if he is truly allergic to penicillins or not. ? ?No history of asthma, eczema, food allergy or seasonal/perennial allergic rhinitis.   ? ?Review of systems: ?Review of Systems  ?Constitutional: Negative.   ?HENT: Negative.    ?Eyes: Negative.   ?Respiratory: Negative.    ?Cardiovascular: Negative.   ?Musculoskeletal: Negative.   ?Skin: Negative.   ?Allergic/Immunologic: Negative.   ?Neurological: Negative.   ? ?All other systems negative unless noted above in HPI ? ?Past medical history: ?Past Medical History:  ?Diagnosis Date  ? CAD in native artery   ? a. Cath 05/2017 -  90% pRCA (small & nondominant) and 40% pLAD >> medical mangment   ? Diabetes mellitus without complication (HCC)   ? Hypertension   ? Morbid obesity (HCC)   ? Obstructive sleep apnea   ? ? ?Past surgical history: ?Past Surgical History:  ?Procedure Laterality Date  ? CORONARY STENT INTERVENTION N/A 09/19/2020  ? Procedure: CORONARY STENT INTERVENTION;  Surgeon: Marykay Lex, MD;  Location: [facility] INVASIVE CV LAB;  Service: Cardiovascular;  Laterality: N/A;  ? CORONARY/GRAFT ACUTE MI REVASCULARIZATION N/A 09/19/2020  ? Procedure: Coronary/Graft Acute MI Revascularization;  Surgeon: Marykay Lex, MD;  Location: [facility] INVASIVE CV LAB;  Service: Cardiovascular;  Laterality:  N/A;  ? ICD IMPLANT N/A 02/16/2021  ? Procedure: ICD IMPLANT;  Surgeon: Duke Salvia, MD;  Location: [facility] INVASIVE CV LAB;  Service: Cardiovascular;  Laterality: N/A;  ? LEFT HEART CATH AND CORONARY ANGIOGRAPHY N/A 05/07/2017  ? Procedure: LEFT HEART CATH AND CORONARY ANGIOGRAPHY;  Surgeon: Swaziland, Peter M, MD;  Location: [facility] INVASIVE CV LAB;  Service: Cardiovascular;  Laterality: N/A;  ? LEFT HEART CATH AND CORONARY ANGIOGRAPHY N/A 09/19/2020  ? Procedure: LEFT HEART CATH AND CORONARY ANGIOGRAPHY;  Surgeon: Marykay Lex, MD;  Location: [facility] INVASIVE CV LAB;  Service: Cardiovascular;  Laterality: N/A;  ? RIGHT HEART CATH N/A 09/19/2020  ? Procedure: RIGHT HEART CATH;  Surgeon: Marykay Lex, MD;  Location: [facility] INVASIVE CV LAB;  Service: Cardiovascular;  Laterality: N/A;  ? ULTRASOUND GUIDANCE FOR VASCULAR ACCESS  05/07/2017  ? Procedure: Ultrasound Guidance For Vascular Access;  Surgeon: Swaziland, Peter M, MD;  Location: [facility] INVASIVE CV LAB;  Service: Cardiovascular;;  ? ? ?Family history:  ?Family History  ?Problem Relation Age of Onset  ? Hypertension Father   ? ? ?Social history: ?He lives in a trailer with carpeting in the bedroom with electric heating and central cooling.  No pets in the home.  There is no concern for water damage, mildew in the home.  There is concern for roaches in the home.  He is a Location manager. ? ? ?Medication List: ?Current Outpatient Medications  ?Medication Sig Dispense Refill  ? acetaminophen (TYLENOL) 325 MG tablet  Take 650 mg by mouth every 6 (six) hours as needed for moderate pain.    ? aspirin 81 MG EC tablet Take 1 tablet (81 mg total) by mouth daily. 30 tablet 11  ? carvedilol (COREG) 3.125 MG tablet Take 2 tablets (6.25 mg total) by mouth 2 (two) times daily. 180 tablet 3  ? empagliflozin (JARDIANCE) 25 MG TABS tablet Take 12.5 mg (1/2 tablet) daily 45 tablet 3  ? Evolocumab (REPATHA SURECLICK) 140 MG/ML SOAJ Inject 1 pen into the skin every 14 (fourteen) days. 2 mL 11  ?  furosemide (LASIX) 20 MG tablet Take 1 tablet (20 mg total) by mouth daily. 90 tablet 1  ? glipiZIDE (GLUCOTROL) 10 MG tablet Take 10 mg by mouth 2 (two) times daily before a meal.     ? metFORMIN (GLUCOPHAGE) 500 MG tablet Take 1,000 mg by mouth 2 (two) times daily with a meal.     ? nitroGLYCERIN (NITROSTAT) 0.4 MG SL tablet Place 1 tablet (0.4 mg total) under the tongue every 5 (five) minutes x 3 doses as needed for chest pain. 25 tablet 0  ? rosuvastatin (CRESTOR) 40 MG tablet Take 1 tablet (40 mg total) by mouth daily. 30 tablet 1  ? sacubitril-valsartan (ENTRESTO) 49-51 MG Take 1 tablet by mouth 2 (two) times daily. 180 tablet 3  ? Semaglutide,0.25 or 0.5MG /DOS, 2 MG/1.5ML SOPN Inject 0.5 mg as directed every Monday.    ? spironolactone (ALDACTONE) 25 MG tablet Take 1 tablet (25 mg total) by mouth at bedtime. 30 tablet 5  ? tamsulosin (FLOMAX) 0.4 MG CAPS capsule Take 0.4 mg by mouth daily.    ? ticagrelor (BRILINTA) 90 MG TABS tablet Take 1 tablet (90 mg total) by mouth 2 (two) times daily. 60 tablet 11  ? ?No current facility-administered medications for this visit.  ? ? ?Known medication allergies: ?Allergies  ?Allergen Reactions  ? Penicillins Anaphylaxis and Other (See Comments)  ?  Has patient had a PCN reaction causing immediate rash, facial/tongue/throat swelling, SOB or lightheadedness with hypotension: Yes ?Has patient had a PCN reaction causing severe rash involving mucus membranes or skin necrosis: No ?Has patient had a PCN reaction that required hospitalization: Unknown ?Has patient had a PCN reaction occurring within the last 10 years: No ?If all of the above answers are "NO", then may proceed with Cephalosporin use. ?  ? ? ? ?Physical examination: ?Blood pressure 116/78, pulse 70, resp. rate 16, height 5' 11.5" (1.816 m), weight 288 lb 3.2 oz (130.7 kg), SpO2 96 %. ? ?General: Alert, interactive, in no acute distress. ?HEENT: PERRLA, TMs pearly gray, turbinates non-edematous without discharge,  post-pharynx non erythematous. ?Neck: Supple without lymphadenopathy. ?Lungs: Clear to auscultation without wheezing, rhonchi or rales. {no increased work of breathing. ?CV: Normal S1, S2 without murmurs. ?Abdomen: Nondistended, nontender. ?Skin: Warm and dry, without lesions or rashes. ?Extremities:  No clubbing, cyanosis or edema. ?Neuro:   Grossly intact. ? ?Diagnositics/Labs: ?Unable to perform graded challenge today as we do not carry penicillin medications in this office ? ?Assessment and plan: ?  ?Penicillin allergy ?-Reported penicillin allergy from childhood ?-Discussed at this time since several decades have passed that he is less likely to be allergic to penicillin.  Most people once a decade has passed are typically no longer allergic ?-Recommendation at this time will be to proceed with a in office graded drug challenge ?-He has been scheduled later this month for an office penicillin drug challenge which we will supply the medication ?-Advise  he should hold all antihistamines for 3 days prior to this challenge today ? ?I appreciate the opportunity to take part in William's care. Please do not hesitate to contact me with questions. ? ?Sincerely, ? ? ?Margo Aye, MD ?Allergy/Immunology ?[facility] ?

## 2021-03-09 ENCOUNTER — Ambulatory Visit (HOSPITAL_COMMUNITY)
Admission: RE | Admit: 2021-03-09 | Discharge: 2021-03-09 | Disposition: A | Discharge status: Home / Self Care | Payer: Medicaid Other | Source: Ambulatory Visit | Attending: Internal Medicine | Admitting: Internal Medicine

## 2021-03-09 VITALS — BP 122/70 | HR 75 | Wt 288.4 lb

## 2021-03-09 DIAGNOSIS — I110 Hypertensive heart disease with heart failure: Secondary | ICD-10-CM | POA: Insufficient documentation

## 2021-03-09 DIAGNOSIS — Z7902 Long term (current) use of antithrombotics/antiplatelets: Secondary | ICD-10-CM | POA: Diagnosis not present

## 2021-03-09 DIAGNOSIS — Z7984 Long term (current) use of oral hypoglycemic drugs: Secondary | ICD-10-CM | POA: Insufficient documentation

## 2021-03-09 DIAGNOSIS — I5022 Chronic systolic (congestive) heart failure: Secondary | ICD-10-CM | POA: Insufficient documentation

## 2021-03-09 DIAGNOSIS — E119 Type 2 diabetes mellitus without complications: Secondary | ICD-10-CM | POA: Diagnosis not present

## 2021-03-09 DIAGNOSIS — Z79899 Other long term (current) drug therapy: Secondary | ICD-10-CM | POA: Diagnosis not present

## 2021-03-09 DIAGNOSIS — G4733 Obstructive sleep apnea (adult) (pediatric): Secondary | ICD-10-CM | POA: Diagnosis not present

## 2021-03-09 DIAGNOSIS — Z7982 Long term (current) use of aspirin: Secondary | ICD-10-CM | POA: Insufficient documentation

## 2021-03-09 DIAGNOSIS — I509 Heart failure, unspecified: Secondary | ICD-10-CM | POA: Diagnosis not present

## 2021-03-09 DIAGNOSIS — I252 Old myocardial infarction: Secondary | ICD-10-CM | POA: Diagnosis not present

## 2021-03-09 DIAGNOSIS — I255 Ischemic cardiomyopathy: Secondary | ICD-10-CM | POA: Insufficient documentation

## 2021-03-09 DIAGNOSIS — Z95810 Presence of automatic (implantable) cardiac defibrillator: Secondary | ICD-10-CM | POA: Insufficient documentation

## 2021-03-09 DIAGNOSIS — I25110 Atherosclerotic heart disease of native coronary artery with unstable angina pectoris: Secondary | ICD-10-CM | POA: Insufficient documentation

## 2021-03-09 DIAGNOSIS — Z955 Presence of coronary angioplasty implant and graft: Secondary | ICD-10-CM | POA: Insufficient documentation

## 2021-03-09 MED ORDER — CARVEDILOL 6.25 MG PO TABS: 6.2500 mg | ORAL_TABLET | Freq: Two times a day (BID) | ORAL | 11 refills | Status: DC

## 2021-03-09 MED ORDER — EZETIMIBE 10 MG PO TABS: 10.0000 mg | ORAL_TABLET | Freq: Every day | ORAL | 3 refills | Status: DC

## 2021-03-09 NOTE — Progress Notes (Signed)
?  ?Advanced Heart Failure Clinic Note  ? ?PCP: [facility] ?PCP-Cardiologist: Kristeen Miss, MD  ?HF Cardiologist: Dr. Shirlee Latch ? ?HPI: Tommy Liu is a 56 y.o. with history of CAD, HTN, type 2 diabetes, OSA but not on CPAP and ischemic CMP presents for followup of CAD and CHF.   ? ?Patient was admitted in 05/2017 with unstable angina. Cath showed 90% proximal stenosis of nondominant RCA and 40% stenosis proximal LAD. Echo showed EF 60-65%. He was managed medically. He was lost to followup as an outpatient until 09/2020.  ? ?Transferred from [place] to [facility] on 09/19/2020 with anterior STEMI and cardiogenic shock. He was hypotensive on arrival and started on norepinephrine. Coronary angiogram demonstrated 90% thrombotic stenosis in mid LAD treated with PCI/DES, suspected embolic disease causing occlusion in distal LAD, chronic OM1 occlusion with collaterals, 90% proximal non-dominant RCA. RHC showed mean RA 9, PA 26/11, mean PCWP 16 with LVEDP 23, CI 2.46. Echo on 09/2020 showed LVEF of 25-30%, WMA consistent with large LAD territory infarct, no LV thrombus, RV okay, RVSP 30 mmHg, no MR. Diuresed with IV Lasix. Initiated on GDMT with Entresto, empagliflozin, and spironolactone. BP too soft for beta blocker. LifeVest placed prior to discharge; discharge weight was 220 lbs.   ?  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  ? ?On 01/24/2021 cardiology f/u, low dose carvedilol was added.   ?  ?He was last seen in AHF clinic by Dr. Shirlee Latch on 02/08/2021. He was wearing Lifevest and his weight was up around 10 lbs. He reported getting occasionally SOB with heavy lifting. He stated he was able to walk around [employer] with no problems.  No chest pain.  No orthopnea/PND.  No chest pain.  He had been referred to start Repatha.  ? ?On 02/16/2021, he was successfully implanted with ICD.  ? ?[facility]. At that visit, A1c had improved from 11% to 8.5%, LDL was  164, and BMET was stable with Scr 0.8 and K 4.3. Of note, he had not started Repatha because [facility] formulary would not cover until he had tried and failed ezetimibe. He was started on ezetimibe 10 mg daily at that visit.  ? ?Today, he returns to HF clinic for pharmacist medication titration. At last visit with MD, carvedilol was increased to 6.25 mg BID. During discussion today, he reports taking one tablet of carvedilol BID of unknown dose; however, prior prescription was written for two 3.125 tablets BID. Unknown if he increased to 6.25 mg BID; however, it is likely that he is only taking carvedilol 3.125 mg BID. Also of note, he reports being out of [place] for a week. He states both medications have been shipped from the [place] and will arrive later this week. Overall patient is feeling fine except for some trouble falling asleep. He does report some symptoms of depression but denies current suicidal ideation. Denies dizziness, lightheadedness, fatigue. Denies chest pain or palpitations. Does endorse some pain from recent ICD implantation. Reports his breathing has been good. Denies SOB walking up stairs. Able to walk around [employer] continuously without getting SOB. Weight at home is stable at 285-288 lbs. Takes furosemide 20 mg daily and has not used any extra. Some trace/1+ edema in left leg. Wears compression stockings. He states [facility] is managing "diabetes and poor blood flow in leg". Denies PND/orthopnea. Appetite ok. Adheres to low-salt diet. Of note, was recently fired from his job a  few days ago, plans to litigate.  ? ?HF Medications: ?Carvedilol 6.25 mg BID- likely only taking 3.125 mg BID ?Entresto 49-51 mg BID ?Spironolactone 25 mg daily ?Jardiance 12.5 mg daily ?Furosemide 20 mg daily ? ?Has the patient been experiencing any side effects to the medications prescribed? No ? ?Does the patient have any problems obtaining medications due to transportation or finances?   Able to get meds from the  [place]; states he is on disability ? ?Understanding of regimen: fair ?Understanding of indications: good ?Potential of compliance: fair ?Patient understands to avoid NSAIDs. ?Patient understands to avoid decongestants. ?  ? ?Pertinent Lab Values: ?[facility] - Serum creatinine 0.8, BUN 13, Potassium 4.3, Sodium 139 ? ?Vital Signs: ?Weight: 288.4 lbs (last clinic weight: 284.8 lbs) ?Blood pressure: 122/70  ?Heart rate: 75  ? ?Assessment/Plan: ?1. CAD: History of unstable angina 05/2017, medically managed (90% stenosis nondominant RCA).  Admitted with acute anterior MI in 09/2020.  Cath with 90% thrombotic stenosis mid LAD, suspected embolic occlusion distal LAD, CTO OM1 with collaterals, 90% nondominant RCA.  Patient treated with DES to mid LAD.  No chest pain.  ?- Continue ticagrelor and ASA 81.  ?- Continue Crestor 40 mg daily  ?- Has not started Repatha as [facility] will not cover until he has tried and failed ezetimibe. Will send script for ezetimibe 10 mg daily. Plan to retry Repatha if LDL does not improve on Crestor and ezetimibe. LDL 164 on 03/07/21 at the [place].  ?2. Chronic systolic CHF: Ischemic cardiomyopathy.  Echo in 09/2020 showed EF 25-30%, LAD wall motion abnormalities, no LV thrombus, normal RV. Echo in 12/2020 showed EF 30% with apical aneurysm but no thrombus.   ?- NYHA class II symptoms. Weight is stable at home, with some trace/1+ LE edema on exam.  ?- Take furosemide 40 mg for 2 days, then continue furosemide 20 mg daily.   ?- Patient did not increase carvedilol to 6.25 mg BID after last visit (prescription was sent to [facility], not the [place]). Will increase carvedilol to 6.25 mg BID.  ?- Continue Entresto 49-51 mg BID.  ?- Continue spironolactone 25 mg daily.  ?- Continue empagliflozin 12.5 mg daily.  ?- S/p ICD implant ?- He has been previously referred to CR at [facility].  ? ?3. Diabetes: Followed at [facility]. ?- A1c improved from 11% to 8.5%. ? ?4. OSA: Strong suspicion.  Needs sleep study, hopefully can  get this through the [place].  ?  ? ?Follow-up with  [facility] on 04/08/2021 ? ?Karle Plumber, PharmD, BCPS, BCCP, CPP ?Heart Failure Clinic Pharmacist ?[phone] ? ? ?

## 2021-03-09 NOTE — Patient Instructions (Addendum)
It was a pleasure seeing you today! ? ?MEDICATIONS: ?-We are changing your medications today ?-Increase carvedilol to 6.25 mg (1 tablet) twice daily ?-Take furosemide (Lasix) 40 mg (2 tablets) daily for 2 days then decrease back to 20 mg (1 tablet) daily ?-Start ezetimibe 10 mg (1 tablet) daily ?-Call if you have questions about your medications. ? ? ?NEXT APPOINTMENT: ?Return to clinic in 1 month with [facility]. ? ?In general, to take care of your heart failure: ?-Limit your fluid intake to 2 Liters (half-gallon) per day.   ?-Limit your salt intake to ideally 2-3 grams (2000-3000 mg) per day. ?-Weigh yourself daily and record, and bring that "weight diary" to your next appointment.  (Weight gain of 2-3 pounds in 1 day typically means fluid weight.) ?-The medications for your heart are to help your heart and help you live longer.   ?-Please contact us before stopping any of your heart medications. ? ?Call the clinic at [phone] with questions or to reschedule future appointments.  ?

## 2021-03-10 ENCOUNTER — Other Ambulatory Visit (HOSPITAL_COMMUNITY): Payer: Self-pay

## 2021-03-10 ENCOUNTER — Telehealth: Payer: Self-pay | Admitting: *Deleted

## 2021-03-10 NOTE — Telephone Encounter (Signed)
-----   Message from [facility] Larose Hires, MD sent at 03/08/2021  5:53 PM EST ----- ?Get penicillin bottle from [place] for penicillin challenge scheduled for 03/29/2021 ?

## 2021-03-11 MED ORDER — AMOXICILLIN 250 MG/5ML PO SUSR: ORAL | 0 refills | Status: DC

## 2021-03-11 NOTE — Telephone Encounter (Signed)
Per Dr. Nelva Bush: ?There has been a backorder on the penicillin that we stock in the office.  So we will need to send in a prescription for him and so that he can bring it to the office when he comes for his challenge.  ? ?Please send in amoxicillin suspension 250 per 5 mL.   Dispense 30 mL with no refills.  In the instructions place  " bring to MD office for challenge"  ?

## 2021-03-11 NOTE — Addendum Note (Signed)
Addended by: Guy Franco on: 03/11/2021 11:50 AM ? ? Modules accepted: Orders ? ?

## 2021-03-11 NOTE — Telephone Encounter (Signed)
RX for amoxicillin suspension 250/5 has been sent in to [facility] in [place].  ? ?Patient was informed and verbalized understanding ?

## 2021-03-29 ENCOUNTER — Ambulatory Visit (INDEPENDENT_AMBULATORY_CARE_PROVIDER_SITE_OTHER): Payer: No Typology Code available for payment source | Admitting: Allergy

## 2021-03-29 ENCOUNTER — Encounter: Payer: Self-pay | Admitting: Allergy

## 2021-03-29 ENCOUNTER — Other Ambulatory Visit: Payer: Self-pay

## 2021-03-29 VITALS — BP 124/82 | HR 72 | Resp 16

## 2021-03-29 DIAGNOSIS — T50905A Adverse effect of unspecified drugs, medicaments and biological substances, initial encounter: Secondary | ICD-10-CM

## 2021-03-29 DIAGNOSIS — T50905D Adverse effect of unspecified drugs, medicaments and biological substances, subsequent encounter: Secondary | ICD-10-CM

## 2021-03-29 NOTE — Addendum Note (Signed)
Addended by: Deborra Medina on: 03/29/2021 12:13 PM ? ? Modules accepted: Orders ? ?

## 2021-03-29 NOTE — Progress Notes (Signed)
? ? ?Follow-up Note ? ?RE: Tommy Liu MRN: [id number] DOB: [birth year] ?Date of Office Visit: 03/29/2021 ? ? ?History of present illness: ?Tommy Liu is a 56 y.o. male presenting today for penicillin challenge.  He was last seen in the office on 03/08/2021 by myself for initial visit.  He states he is just tired today but otherwise is in his normal state of health today without any recent illness or antibiotic needs.  He has not had any antihistamines for the past 3 days for this challenge. ? ?From his initial visit HPI:  He states as a child he got a penicillin shot and this would have been about 47 years ago. He states his mother told him he had an allergic reaction and may have had throat swelling.    He has been avoiding ever since. He states multiple of his family members (including mother, sibling) all had listed penicillin allergy as well.  ?He would like to know at this time if he is truly allergic to penicillins or not. ? ? ?Review of systems: ?Review of Systems  ?Constitutional:  Positive for fatigue.  ?HENT: Negative.    ?Eyes: Negative.   ?Respiratory: Negative.    ?Cardiovascular: Negative.   ?Musculoskeletal: Negative.   ?Skin: Negative.   ?Allergic/Immunologic: Negative.   ?Neurological: Negative.    ? ?All other systems negative unless noted above in HPI ? ?Past medical/social/surgical/family history have been reviewed and are unchanged unless specifically indicated below. ? ?No changes ? ?Medication List: ?Current Outpatient Medications  ?Medication Sig Dispense Refill  ? acetaminophen (TYLENOL) 325 MG tablet Take 650 mg by mouth every 6 (six) hours as needed for moderate pain.    ? aspirin 81 MG EC tablet Take 1 tablet (81 mg total) by mouth daily. 30 tablet 11  ? carvedilol (COREG) 6.25 MG tablet Take 1 tablet (6.25 mg total) by mouth 2 (two) times daily. 60 tablet 11  ? empagliflozin (JARDIANCE) 25 MG TABS tablet Take 12.5 mg (1/2 tablet) daily 45 tablet 3  ? ezetimibe (ZETIA) 10 MG  tablet Take 1 tablet (10 mg total) by mouth daily. 90 tablet 3  ? furosemide (LASIX) 20 MG tablet Take 1 tablet (20 mg total) by mouth daily. 90 tablet 1  ? glipiZIDE (GLUCOTROL) 10 MG tablet Take 10 mg by mouth 2 (two) times daily before a meal.     ? metFORMIN (GLUCOPHAGE) 500 MG tablet Take 1,000 mg by mouth 2 (two) times daily with a meal.     ? nitroGLYCERIN (NITROSTAT) 0.4 MG SL tablet Place 1 tablet (0.4 mg total) under the tongue every 5 (five) minutes x 3 doses as needed for chest pain. 25 tablet 0  ? rosuvastatin (CRESTOR) 40 MG tablet Take 1 tablet (40 mg total) by mouth daily. 30 tablet 1  ? sacubitril-valsartan (ENTRESTO) 49-51 MG Take 1 tablet by mouth 2 (two) times daily. 180 tablet 3  ? Semaglutide,0.25 or 0.5MG /DOS, 2 MG/1.5ML SOPN Inject 0.5 mg as directed every Monday.    ? spironolactone (ALDACTONE) 25 MG tablet Take 1 tablet (25 mg total) by mouth at bedtime. 30 tablet 5  ? tamsulosin (FLOMAX) 0.4 MG CAPS capsule Take 0.4 mg by mouth daily.    ? ticagrelor (BRILINTA) 90 MG TABS tablet Take 1 tablet (90 mg total) by mouth 2 (two) times daily. 60 tablet 11  ? ?No current facility-administered medications for this visit.  ?  ? ?Known medication allergies: ?Allergies  ?Allergen Reactions  ? Penicillins Anaphylaxis  and Other (See Comments)  ?  Has patient had a PCN reaction causing immediate rash, facial/tongue/throat swelling, SOB or lightheadedness with hypotension: Yes ?Has patient had a PCN reaction causing severe rash involving mucus membranes or skin necrosis: No ?Has patient had a PCN reaction that required hospitalization: Unknown ?Has patient had a PCN reaction occurring within the last 10 years: No ?If all of the above answers are "NO", then may proceed with Cephalosporin use. ?  ? ? ? ?Physical examination: ?Blood pressure 124/82, pulse 72, resp. rate 16, SpO2 99 %. ? ?General: Alert, interactive, in no acute distress. ?HEENT: PERRLA, TMs pearly gray, turbinates non-edematous without  discharge, post-pharynx non erythematous. ?Neck: Supple without lymphadenopathy. ?Lungs: Clear to auscultation without wheezing, rhonchi or rales. {no increased work of breathing. ?CV: Normal S1, S2 without murmurs. ?Abdomen: Nondistended, nontender. ?Skin: Warm and dry, without lesions or rashes. ?Extremities:  No clubbing, cyanosis or edema. ?Neuro:   Grossly intact. ? ?Diagnositics/Labs: ?Medication challenge to amoxicillin 250mg /5 mL.  Benefits and risks of challenge discussed and consent obtained.  He was provided with a 1%, 10% and 90% dosing of total of 500 mg every 20 minutes.  He was observed for additional hour after completion of ingestion challenge.  He had no signs/symptoms of allergic reaction.  Vitals were obtained prior to discharge and remained stable.    ? ? ?Assessment and plan: ?  ?History of penicillin allergy ?-In office graded challenge to amoxicillin (penicillin-based antibiotic) is successfully passed!  ?-You are no longer considered penicillin allergic and can use this medication in the future if needed for appropriate infection treatment ?-We will remove penicillin allergy from your allergy medication list ? ?Follow-up as needed ? ?I appreciate the opportunity to take part in Berman's care. Please do not hesitate to contact me with questions. ? ?Sincerely, ? ? ? , MD ?Allergy/Immunology ?[facility] ? ? ?

## 2021-03-29 NOTE — Patient Instructions (Addendum)
History of penicillin allergy ?-In office graded challenge to amoxicillin (penicillin-based antibiotic) is successfully passed!  ?-You are no longer considered penicillin allergic and can use this medication in the future if needed for appropriate infection treatment ?-We will remove penicillin allergy from your allergy medication list ? ?Follow-up as needed ?

## 2021-03-30 ENCOUNTER — Telehealth (HOSPITAL_COMMUNITY): Payer: Self-pay | Admitting: *Deleted

## 2021-03-30 NOTE — Telephone Encounter (Signed)
Pt left vm asking if we could write a note for work extending what his surgeons office had already written. I called pt to get more information no answer. Called again and received a recording that the number was unavailable.  ?

## 2021-03-31 ENCOUNTER — Telehealth (HOSPITAL_COMMUNITY): Payer: Self-pay

## 2021-03-31 ENCOUNTER — Telehealth: Payer: Self-pay | Admitting: Internal Medicine

## 2021-03-31 NOTE — Telephone Encounter (Signed)
Patient called back to report that he wanted another work note since his expires today since being out for having his ICD implanted. I advised him that it was best to get that documentation from Dr. Koren Bound office and provided him with their office number.  ?

## 2021-03-31 NOTE — Telephone Encounter (Signed)
Spoke with pt and he states he needs an extension in his work note because if he doesn't have one, then he will lose his long term disability.  Inquired why he felt he could not return to work and pt stated that he does not have a job any longer.  States he was terminated 4 days after his procedure.  Advised pt that, from an ICD standpoint, we don't have a reason to extend his leave because he should be able to return to work with no restrictions at this point.  Pt verbalized understanding and will check again with HF team to see if they could extend it for him from a HF standpoint.  Will route to HF team to make them aware.  ?

## 2021-03-31 NOTE — Telephone Encounter (Signed)
Patient wants to know if you can extend his work note for long term disability for having his ICD implanted. Dr. Koren Bound office cleared him to go back to work so they wont extend it. Please advise.  ? ? ? ?Case worker:[phone] [place]  ?

## 2021-03-31 NOTE — Telephone Encounter (Signed)
Patient states he needs an extension on his out of work note.  ?

## 2021-03-31 NOTE — Telephone Encounter (Signed)
He said another month is fine, will work on this asap  ?

## 2021-03-31 NOTE — Telephone Encounter (Signed)
Note was faxed to [phone] attn Truddie Crumble  ?

## 2021-03-31 NOTE — Telephone Encounter (Signed)
How long does he feel like he needs it extended? Would give him another month.  ?

## 2021-04-08 ENCOUNTER — Encounter (HOSPITAL_COMMUNITY): Payer: Self-pay

## 2021-04-08 ENCOUNTER — Ambulatory Visit (HOSPITAL_COMMUNITY)
Admission: RE | Admit: 2021-04-08 | Discharge: 2021-04-08 | Disposition: A | Discharge status: Home / Self Care | Payer: Medicaid Other | Source: Ambulatory Visit | Attending: Family Medicine | Admitting: Family Medicine

## 2021-04-08 VITALS — BP 140/80 | HR 75 | Wt 290.6 lb

## 2021-04-08 DIAGNOSIS — I5022 Chronic systolic (congestive) heart failure: Secondary | ICD-10-CM | POA: Diagnosis not present

## 2021-04-08 DIAGNOSIS — E119 Type 2 diabetes mellitus without complications: Secondary | ICD-10-CM | POA: Diagnosis not present

## 2021-04-08 DIAGNOSIS — G4733 Obstructive sleep apnea (adult) (pediatric): Secondary | ICD-10-CM | POA: Insufficient documentation

## 2021-04-08 DIAGNOSIS — Z955 Presence of coronary angioplasty implant and graft: Secondary | ICD-10-CM | POA: Insufficient documentation

## 2021-04-08 DIAGNOSIS — Z79899 Other long term (current) drug therapy: Secondary | ICD-10-CM | POA: Diagnosis not present

## 2021-04-08 DIAGNOSIS — I255 Ischemic cardiomyopathy: Secondary | ICD-10-CM | POA: Insufficient documentation

## 2021-04-08 DIAGNOSIS — I2510 Atherosclerotic heart disease of native coronary artery without angina pectoris: Secondary | ICD-10-CM | POA: Diagnosis not present

## 2021-04-08 DIAGNOSIS — R5383 Other fatigue: Secondary | ICD-10-CM | POA: Diagnosis present

## 2021-04-08 DIAGNOSIS — Z7982 Long term (current) use of aspirin: Secondary | ICD-10-CM | POA: Insufficient documentation

## 2021-04-08 DIAGNOSIS — I110 Hypertensive heart disease with heart failure: Secondary | ICD-10-CM | POA: Insufficient documentation

## 2021-04-08 DIAGNOSIS — I252 Old myocardial infarction: Secondary | ICD-10-CM | POA: Diagnosis not present

## 2021-04-08 DIAGNOSIS — Z7902 Long term (current) use of antithrombotics/antiplatelets: Secondary | ICD-10-CM | POA: Insufficient documentation

## 2021-04-08 DIAGNOSIS — E1159 Type 2 diabetes mellitus with other circulatory complications: Secondary | ICD-10-CM

## 2021-04-08 DIAGNOSIS — R0602 Shortness of breath: Secondary | ICD-10-CM | POA: Insufficient documentation

## 2021-04-08 LAB — BASIC METABOLIC PANEL
Anion gap: 8 (ref 5–15)
BUN: 14 mg/dL (ref 6–20)
CO2: 25 mmol/L (ref 22–32)
Calcium: 9.9 mg/dL (ref 8.9–10.3)
Chloride: 104 mmol/L (ref 98–111)
Creatinine, Ser: 0.67 mg/dL (ref 0.61–1.24)
GFR, Estimated: >60 mL/min (ref >60)
Glucose, Bld: 234 mg/dL — ABNORMAL HIGH (ref 70–99)
Potassium: 4.3 mmol/L (ref 3.5–5.1)
Sodium: 137 mmol/L (ref 135–145)

## 2021-04-08 LAB — BRAIN NATRIURETIC PEPTIDE: B Natriuretic Peptide: 95 pg/mL (ref 0.0–100.0)

## 2021-04-08 MED ORDER — ENTRESTO 97-103 MG PO TABS: 1.0000 | ORAL_TABLET | Freq: Two times a day (BID) | ORAL | 6 refills | Status: DC

## 2021-04-08 MED ORDER — FUROSEMIDE 20 MG PO TABS: 40.0000 mg | ORAL_TABLET | Freq: Every day | ORAL | 6 refills | Status: DC

## 2021-04-08 MED ORDER — POTASSIUM CHLORIDE CRYS ER 20 MEQ PO TBCR: 20.0000 meq | EXTENDED_RELEASE_TABLET | Freq: Two times a day (BID) | ORAL | 5 refills | Status: DC

## 2021-04-08 NOTE — Progress Notes (Signed)
?Advanced Heart Failure Clinic Note  ? ? ?PCP: Tommy Liu, Tommy Liu ?PCP-Cardiologist: Tommy MissPhilip Nahser, Liu  ?HF Cardiologist: Dr. Shirlee Liu ? ?Tommy LevelsJesse Liu is a 56 y.o. with history of CAD, HTN, type 2 diabetes, OSA but not on CPAP and ischemic CMP presents for followup of CAD and CHF.  Patient was admitted in 5/19 with unstable angina.  Cath showed 90% proximal stenosis of nondominant RCA and 40% stenosis proximal LAD.  Echo showed EF 60-65%.  He was managed medically.  He was lost to followup as an outpatient until 9/22.  ?  ?[facility] with anterior STEMI and cardiogenic shock. He was hypotensive on arrival and started on norepinephrine. Coronary angiogram demonstrated 90% thrombotic stenosis in mid LAD treated with PCI/DES, suspected embolic disease causing occlusion in distal LAD, chronic OM1 occlusion with collaterals, 90% proximal non-dominant RCA. RHC showed mean RA 9, PA 26/11, mean PCWP 16 with LVEDP 23, CI 2.46. Echo 9/22 with LVEF of 25-30%, WMA consistent with large LAD territory infarct, no LV thrombus, RV okay, RVSP 30 mmHg, no MR.  Diuresed with IV lasix. Initiated on GDMT with Entresto, empagliflozin, and spironolactone. BP too soft for beta blocker. LifeVest placed prior to discharge; discharge weight 220 lbs.   ? 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  ? ?Follow up with 2/23 stable NYHA II symptoms, carvedilol increased. [facility].  ? ?S/p ICD implant 2/23. ? ?Today he returns for HF follow up. Overall feeling fair. Main complaint is poor sleep and he is generally fatigued. He is SOB walking further distances on flat ground. Endorses recent PND.   Denies palpitations, abnormal bleeding, CP, dizziness, or edema. Appetite ok. No fever or chills. Weight at home 285 pounds. Taking all medications.  ? ?ReDs: 40% ? ?Device interrogation (personally reviewed): HL Score 0, 1.7 hrs/day activity, average HR  73 ? ?Labs (9/22): LDL 105 ?Labs (11/22): K 3.8, creatinine 0.64 ?Labs (1/23): K 4.4, creatinine 0.66 ? ?PMH: ?1. HTN ?2. Type 2 diabetes ?3. OSA: Does not have CPAP.  ?4. CAD: Unstable angina in 5/19, cath with 90% proximal nondominant small RCA.  ?- 9/22 anterior STEMI: 90% thrombotic mid LAD stenosis treated with DES, total occlusion of distal LAD likely by embolic event, totally occluded OM1 with collaterals, 90% proximal stenosis nondominant RCA.  ?5. Chronic systolic CHF: Ischemic cardiomyopathy.   ?- Echo (9/22): EF 25-30%, LAD territory WMAs, RV ok.  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  ? ?Current Outpatient Medications  ?Medication Sig Dispense Refill  ? acetaminophen (TYLENOL) 325 MG tablet Take 650 mg by mouth every 6 (six) hours as needed for moderate pain.    ? aspirin 81 MG EC tablet Take 1 tablet (81 mg total) by mouth daily. 30 tablet 11  ? carvedilol (COREG) 6.25 MG tablet Take 1 tablet (6.25 mg total) by mouth 2 (two) times daily. 60 tablet 11  ? empagliflozin (JARDIANCE) 25 MG TABS tablet Take 12.5 mg (1/2 tablet) daily 45 tablet 3  ? ezetimibe (ZETIA) 10 MG tablet Take 1 tablet (10 mg total) by mouth daily. 90 tablet 3  ? furosemide (LASIX) 20 MG tablet Take 1 tablet (20 mg total) by mouth daily. 90 tablet 1  ? glipiZIDE (GLUCOTROL) 10 MG tablet Take 10 mg by mouth 2 (two) times daily before a meal.     ? metFORMIN (GLUCOPHAGE) 500 MG tablet Take 1,000  mg by mouth 2 (two) times daily with a meal.     ? nitroGLYCERIN (NITROSTAT) 0.4 MG SL tablet Place 1 tablet (0.4 mg total) under the tongue every 5 (five) minutes x 3 doses as needed for chest pain. 25 tablet 0  ? rosuvastatin (CRESTOR) 40 MG tablet Take 1 tablet (40 mg total) by mouth daily. 30 tablet 1  ? sacubitril-valsartan (ENTRESTO) 49-51 MG Take 1 tablet by mouth 2 (two) times daily. 180 tablet 3  ? Semaglutide,0.25 or 0.5MG /DOS, 2 MG/1.5ML SOPN Inject 0.5 mg as directed every Monday.    ?  spironolactone (ALDACTONE) 25 MG tablet Take 1 tablet (25 mg total) by mouth at bedtime. 30 tablet 5  ? tamsulosin (FLOMAX) 0.4 MG CAPS capsule Take 0.4 mg by mouth daily.    ? ticagrelor (BRILINTA) 90 MG TABS tablet Take 1 tablet (90 mg total) by mouth 2 (two) times daily. 60 tablet 11  ? ?No current facility-administered medications for this encounter.  ? ?No Known Allergies ? ?Social History  ? ?Socioeconomic History  ? Marital status: Divorced  ?  Spouse name: Not on file  ? Number of children: Not on file  ? Years of education: Not on file  ? Highest education level: Not on file  ?Occupational History  ? Not on file  ?Tobacco Use  ? Smoking status: Former  ?  Packs/day: 1.00  ?  Years: 30.00  ?  Pack years: 30.00  ?  Types: Cigarettes  ?  Quit date: 01/02/1997  ?  Years since quitting: 24.2  ? Smokeless tobacco: Former  ?Vaping Use  ? Vaping Use: Never used  ?Substance and Sexual Activity  ? Alcohol use: Yes  ?  Alcohol/week: 2.0 standard drinks  ?  Types: 2 Cans of beer per week  ? Drug use: Never  ? Sexual activity: Not on file  ?Other Topics Concern  ? Not on file  ?Social History Narrative  ? Lives alone.  Still has support of ex-wife and other relatives  ? ?Social Determinants of Health  ? ?Financial Resource Strain: High Risk  ? Difficulty of Paying Living Expenses: Hard  ?Food Insecurity: No Food Insecurity  ? Worried About Programme researcher, broadcasting/film/video in the Last Year: Never true  ? Ran Out of Food in the Last Year: Never true  ?Transportation Needs: No Transportation Needs  ? Lack of Transportation (Medical): No  ? Lack of Transportation (Non-Medical): No  ?Physical Activity: Not on file  ?Stress: Not on file  ?Social Connections: Not on file  ?Intimate Partner Violence: Not on file  ? ?Family History  ?Problem Relation Age of Onset  ? Hypertension Father   ? ?BP 140/80   Pulse 75   Wt 131.8 kg (290 lb 9.6 oz)   SpO2 97%   BMI 39.97 kg/m?  ? ?Wt Readings from Last 3 Encounters:  ?04/08/21 131.8 kg (290 lb 9.6  oz)  ?03/09/21 130.8 kg (288 lb 6.4 oz)  ?03/08/21 130.7 kg (288 lb 3.2 oz)  ? ?PHYSICAL EXAM: ?General:  NAD. No resp difficulty ?HEENT: Normal ?Neck: Supple. Thick neck. Carotids 2+ bilat; no bruits. No lymphadenopathy or thryomegaly appreciated. ?Cor: PMI nondisplaced. Regular rate & rhythm. No rubs, gallops or murmurs. ?Lungs: Clear ?Abdomen: Obese, soft, nontender, nondistended. No hepatosplenomegaly. No bruits or masses. Good bowel sounds. ?Extremities: No cyanosis, clubbing, rash, edema ?Neuro: Alert & oriented x 3, cranial nerves grossly intact. Moves all 4 extremities w/o difficulty. Affect pleasant. ? ?ASSESSMENT & PLAN: ?1.  CAD: History of unstable angina 5/19, medically managed (90% stenosis nondominant RCA).  Admitted with acute anterior MI in 9/22.  Cath with 90% thrombotic stenosis mid LAD, suspected embolic occlusion distal LAD, CTO OM1 with collaterals, 90% nondominant RCA.  Patient treated with DES to mid LAD.  No chest pain.  ?- Continue ticagrelor and ASA 81.  ?- Crestor 40 mg daily + Repatha.  ?2. Chronic systolic CHF: Ischemic cardiomyopathy.  Echo in 9/22 showed EF 25-30%, LAD wall motion abnormalities, no LV thrombus, normal RV. Echo in 12/22 showed EF 30% with apical aneurysm but no thrombus.  He appears volume overloaded, weight is up, ReDs 40%. NYHA class II-III symptoms, primarily due to fatigue.  ?- Increase Entresto to 97/103 mg bid. ?- Increase Lasix to 40 mg daily. BMET and BNP today, repeat BMET in 10-14 days. ?- Continue empagliflozin 12.5 mg daily.  ?- Continue spironolactone 25 daily.  ?- Continue Coreg 6.25 mg bid.   ?- He now has ICD. ?- He has been referred to CR at [facility]. He has not started yet. ?3. Diabetes: Followed at [facility]. ?4. OSA: Strong suspicion.  Needs sleep study, hopefully can get this through the [place]. He has appt to complete this soon. ? ?We discussed RTW today. With his current symptoms, he nor I feel he can return to work yet. He provided disability  papers, given to [employer]. He will need to stay out of work until 6 week follow up, possibly longer, pending systems. ? ?Follow up with APP in 6 weeks (ReDs) and Dr. Shirlee Latch in 3 months. ? ?Jacklynn Ganong, FNP ?04/0

## 2021-04-08 NOTE — Progress Notes (Signed)
ReDS Vest / Clip - 04/08/21 0800   ? ?  ? ReDS Vest / Clip  ? Station Marker D   ? Ruler Value 40   ? ReDS Value Range Moderate volume overload   ? ReDS Actual Value 40   ? ?  ?  ? ?  ? ? ?

## 2021-04-08 NOTE — Patient Instructions (Signed)
Thank you for coming in today ? ?Labs were done today, if any labs are abnormal the clinic will call you ?No news is good news  ? ?INCREASE Entresto 97/103 mg 1 tablet twice daily ? ?INCREASE Lasix 40 mg 2 tablets daily ?START Potassium 20 meq 1 tablet daily  ? ?Your physician recommends that you schedule a follow-up appointment in:  ?6 weeks in clinic  ?3 months with Dr. Shirlee Latch ? ?At the Advanced [facility] Failure Clinic, you and your health needs are our priority. As part of our continuing mission to provide you with exceptional heart care, we have created designated Provider Care Teams. These Care Teams include your primary Cardiologist (physician) and Advanced Practice Providers (APPs- Physician Assistants and Nurse Practitioners) who all work together to provide you with the care you need, when you need it.  ? ?You may see any of the following providers on your designated Care Team at your next follow up: ?Dr Arvilla Meres ?Dr Marca Ancona ?Tonye Becket, NP ?Robbie Lis, PA ?Jessica Milford,NP ?Anna Genre, PA ?Karle Plumber, PharmD ? ? ?Please be sure to bring in all your medications bottles to every appointment.  ? ?If you have any questions or concerns before your next appointment please send [place] a message through [place] or call our office at [phone].   ? ?TO LEAVE A MESSAGE FOR THE NURSE SELECT OPTION 2, PLEASE LEAVE A MESSAGE INCLUDING: ?YOUR NAME ?DATE OF BIRTH ?CALL BACK NUMBER ?REASON FOR CALL**this is important as we prioritize the call backs ? ?YOU WILL RECEIVE A CALL BACK THE SAME DAY AS LONG AS YOU CALL BEFORE 4:00 PM ?  ?

## 2021-04-12 ENCOUNTER — Telehealth: Payer: Self-pay | Admitting: Pharmacist

## 2021-04-12 NOTE — Telephone Encounter (Signed)
LDL-C at [facility] in March was 164. Was supposed to be started on Repatha from the [place] in January. ? ?Called patient. Did not get Repatha from [place]. He also states that his Delene Loll was sent to [facility] but it cost >1000. I will try sending to the [place]. I will also try contacting Dr. Magdalene Patricia about Repatha and Delene Loll. ? ?Looks like Dr. Magdalene Patricia is aware that pt couldn't get Repatha and prescribed zetia.  ? ?Patient states he needs his disability paperwork he dropped at CHF clinic faxed. Contacted heather who states she faxed yesterday. ? ?I spoke with patient- let him know about his paperwork. He said it was a week and a half too late. ?I asked him about ezetimibe- he is not sure if he is taking. I asked him to check bc it was sent 3/6 by his [place] doctor and then by CHF clinic as well. ?

## 2021-04-21 ENCOUNTER — Encounter: Payer: Self-pay | Admitting: Neurology

## 2021-04-21 ENCOUNTER — Ambulatory Visit (INDEPENDENT_AMBULATORY_CARE_PROVIDER_SITE_OTHER): Payer: No Typology Code available for payment source | Admitting: Neurology

## 2021-04-21 VITALS — BP 148/93 | HR 60 | Ht 73.0 in | Wt 294.0 lb

## 2021-04-21 DIAGNOSIS — Z9189 Other specified personal risk factors, not elsewhere classified: Secondary | ICD-10-CM | POA: Diagnosis not present

## 2021-04-21 DIAGNOSIS — F5104 Psychophysiologic insomnia: Secondary | ICD-10-CM

## 2021-04-21 DIAGNOSIS — I739 Peripheral vascular disease, unspecified: Secondary | ICD-10-CM

## 2021-04-21 DIAGNOSIS — F341 Dysthymic disorder: Secondary | ICD-10-CM

## 2021-04-21 DIAGNOSIS — I5041 Acute combined systolic (congestive) and diastolic (congestive) heart failure: Secondary | ICD-10-CM

## 2021-04-21 DIAGNOSIS — I5022 Chronic systolic (congestive) heart failure: Secondary | ICD-10-CM

## 2021-04-21 DIAGNOSIS — R0601 Orthopnea: Secondary | ICD-10-CM | POA: Diagnosis not present

## 2021-04-21 DIAGNOSIS — I25110 Atherosclerotic heart disease of native coronary artery with unstable angina pectoris: Secondary | ICD-10-CM

## 2021-04-21 DIAGNOSIS — E66812 Obesity, class 2: Secondary | ICD-10-CM | POA: Insufficient documentation

## 2021-04-21 DIAGNOSIS — Z6838 Body mass index (BMI) 38.0-38.9, adult: Secondary | ICD-10-CM

## 2021-04-21 NOTE — Addendum Note (Signed)
Addended by: Melvyn Novas on: 04/21/2021 12:29 PM ? ? Modules accepted: Orders ? ?

## 2021-04-21 NOTE — Progress Notes (Addendum)
? ? ?SLEEP MEDICINE CLINIC ?  ? ?Provider:  Larey Seat, MD  ?Primary Care Physician:  Raelyn Mora, MD ?[address] ?[place] [address]  ? ?  ?Referring Provider: Raelyn Mora, Md ?[address] ?[place],  [place] [address]  ?  ?  ?    ?Chief Complaint according to patient   ?Patient presents with:  ?  ? New Patient (Initial Visit)  ?     ?  ?  ?HISTORY OF PRESENT ILLNESS:  ?Tommy Liu is a 56 y.o. Caucasian male patient, seen on  04/21/2021  ? ?He is seen here upon referral from PCP  for a Sleep consultation.  ?Chief concern according to patient :  Patient states he avg about 3 hrs of sleep. He will wake up and has a hard time going back to sleep. Sometimes he will have a hard time falling asleep. PSG in lab- several years ago with the [facility] before Myocardial infarction ( stent, MI in 09-19-2020,  and MI in 2019).Since February 2023 he has a pacemaker in situ. CHF with EF at 25%.  ?He was told he had sleep apnea but never started CPAP.  ?Tommy Liu  has a past medical history of Asthma, CAD in native artery, Class 2 morbid obesity, Diabetes mellitus without complication ([place]), hypercholesterolemia, Hypertension, and untreated Obstructive sleep apnea. ?  ?The patient had the first sleep study in the year 2006  with a result of OSA at the [place], [place].  ?  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 Family medical /sleep history:mother CAD Emphysema, father smoker, COPD,  No other family member on CPAP with OSA, insomnia, sleep walkers.  ?  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 ?Patient is working as Research scientist (physical sciences)- and was fired the day after his pacemaker was implanted while on short term disability- and lives  in a household alone. ? Family status is  divorced , without children  ?The patient currently works/ used to work in shifts( Presenter, broadcasting,) ?Pets are not present. ?Tobacco use, quit after 7 years 1999.  ? ETOH use is rare ,  ?Caffeine intake in form of Coffee( 1 cup in AM) , Soda( 1 a week or less) Tea ( /) or energy drinks. ?Regular exercise in form of .rehab   ? ?  ?Sleep habits are as follows: The patient's dinner time is between 4-8 and often skips meals. ? The patient goes to bed at variable times, 7-10 PM and continues to sleep for variable  hours. ?The preferred sleep position is variable , with the support of 2 pillows.  ?Dreams are reportedly infrequent/vivid. He often sleep in his recliner in front of his TV.  ?NO established rise time, no routine for bedtime, may take  all day naps. ?He reports not feeling refreshed or restored in AM, with symptoms such as dry mouth, morning headaches and congestion, and residual fatigue.  ?Naps are taken infrequently, mainly too depressed to get up and too depressed for established routines.  ? ?  ?Review of Systems: ?Out of a complete 14 system review, the patient complains of only the following symptoms, and all other reviewed systems are negative.:  ?  Fatigue, sleepiness , snoring, fragmented sleep, Insomnia  ?  ?How likely are you to doze in the following situations: ?0 = not likely, 1 = slight chance, 2 = moderate chance, 3 = high chance ?  ?Sitting and Reading? ?Watching Television? ?Sitting inactive in a public place (theater or meeting)? ?As a passenger in a car for an hour without a break? ?Lying down in the afternoon when circumstances permit? ?Sitting and talking to someone? ?Sitting quietly after lunch without alcohol? ?In a car, while stopped for a few minutes in traffic? ?  ?Total = 1/ 24 points  ? FSS endorsed at 33/ 63 points.  ? ?GDS : 12/ 15 points.  ? ?Social History  ? ?Socioeconomic History  ? Marital status: Divorced  ?  Spouse name: Not on file   ? Number of children: Not on file  ? Years of education: Not on file  ? Highest education level: Not on file  ?Occupational History  ? Not on file  ?Tobacco Use  ? Smoking status: Former  ?  Packs/day: 1.00  ?  Years: 30.00  ?  Pack years: 30.00  ?  Types: Cigarettes  ?  Quit date: 01/02/1997  ?  Years since quitting: 24.3  ? Smokeless tobacco: Former  ?Vaping Use  ? Vaping Use: Never used  ?Substance and Sexual Activity  ? Alcohol use: Yes  ?  Alcohol/week: 2.0 standard drinks  ?  Types: 2 Cans of beer per week  ? Drug use: Never  ? Sexual activity: Not on file  ?Other Topics Concern  ? Not on file  ?Social History Narrative  ? Lives alone.  Still has support of ex-wife and other relatives  ? ?Social Determinants of Health  ? ?Financial Resource Strain: High Risk  ? Difficulty of Paying Living Expenses: Hard  ?Food Insecurity: No Food Insecurity  ? Worried About Charity fundraiser in the Last Year: Never true  ? Ran Out of Food in the Last Year: Never true  ?Transportation Needs: No Transportation Needs  ? Lack of Transportation (Medical): No  ? Lack of Transportation (Non-Medical): No  ?Physical Activity: Not on file  ?Stress: Not on file  ?Social Connections: Not on file  ? ? ?Family History  ?Problem Relation Age of Onset  ? Hypertension Father   ? ? ?Past Medical History:  ?Diagnosis Date  ? CAD in native artery   ? a. Cath 05/2017 -  90% pRCA (small & nondominant) and 40% pLAD >> medical mangment   ? Diabetes mellitus without complication ([place])   ? Hypertension   ? Morbid obesity ([place])   ? Obstructive sleep apnea   ? ? ?Past Surgical History:  ?Procedure Laterality Date  ? CORONARY STENT INTERVENTION N/A 09/19/2020  ? Procedure: CORONARY STENT INTERVENTION;  Surgeon: Leonie Man, MD;  Location: [facility];  Service: Cardiovascular;  Laterality: N/A;  ? CORONARY/GRAFT ACUTE MI REVASCULARIZATION N/A 09/19/2020  ? Procedure: Coronary/Graft Acute MI Revascularization;  Surgeon: Leonie Man, MD;   Location: [facility];  Service: Cardiovascular;  Laterality: N/A;  ? ICD IMPLANT N/A 02/16/2021  ? Procedure: ICD IMPLANT;  Surgeon: Deboraha Sprang, MD;  Location: [facility];  Service: Cardiovascular;  Laterality: N/A;  ? LEFT HEART CATH AND CORONARY ANGIOGRAPHY N/A 05/07/2017  ? Procedure: LEFT HEART CATH AND CORONARY ANGIOGRAPHY;  Surgeon: Martinique, Peter M, MD;  Location: [facility];  Service: Cardiovascular;  Laterality: N/A;  ? LEFT  HEART CATH AND CORONARY ANGIOGRAPHY N/A 09/19/2020  ? Procedure: LEFT HEART CATH AND CORONARY ANGIOGRAPHY;  Surgeon: Leonie Man, MD;  Location: [facility];  Service: Cardiovascular;  Laterality: N/A;  ? RIGHT HEART CATH N/A 09/19/2020  ? Procedure: RIGHT HEART CATH;  Surgeon: Leonie Man, MD;  Location: [facility];  Service: Cardiovascular;  Laterality: N/A;  ? ULTRASOUND GUIDANCE FOR VASCULAR ACCESS  05/07/2017  ? Procedure: Ultrasound Guidance For Vascular Access;  Surgeon: Martinique, Peter M, MD;  Location: [facility];  Service: Cardiovascular;;  ?  ? ?Current Outpatient Medications on File Prior to Visit  ?Medication Sig Dispense Refill  ? acetaminophen (TYLENOL) 325 MG tablet Take 650 mg by mouth every 6 (six) hours as needed for moderate pain.    ? aspirin 81 MG EC tablet Take 1 tablet (81 mg total) by mouth daily. 30 tablet 11  ? carvedilol (COREG) 6.25 MG tablet Take 1 tablet (6.25 mg total) by mouth 2 (two) times daily. 60 tablet 11  ? empagliflozin (JARDIANCE) 25 MG TABS tablet Take 12.5 mg (1/2 tablet) daily 45 tablet 3  ? ezetimibe (ZETIA) 10 MG tablet Take 1 tablet (10 mg total) by mouth daily. 90 tablet 3  ? furosemide (LASIX) 20 MG tablet Take 2 tablets (40 mg total) by mouth daily. 60 tablet 6  ? glipiZIDE (GLUCOTROL) 10 MG tablet Take 10 mg by mouth 2 (two) times daily before a meal.     ? metFORMIN (GLUCOPHAGE) 500 MG tablet Take 1,000 mg by mouth 2 (two) times daily with a meal.     ? nitroGLYCERIN (NITROSTAT) 0.4  MG SL tablet Place 1 tablet (0.4 mg total) under the tongue every 5 (five) minutes x 3 doses as needed for chest pain. 25 tablet 0  ? potassium chloride SA (KLOR-CON M) 20 MEQ tablet Take 1 tablet (20 mEq t

## 2021-04-21 NOTE — Patient Instructions (Addendum)
? ?Sleep Apnea ?Sleep apnea affects breathing during sleep. It causes breathing to stop for 10 seconds or more, or to become shallow. People with sleep apnea usually snore loudly. ?It can also increase the risk of: ?Heart attack. ?Stroke. ?Being very overweight (obese). ?Diabetes. ?Heart failure. ?Irregular heartbeat. ?High blood pressure. ?The goal of treatment is to help you breathe normally again. ?What are the causes? ? ?The most common cause of this condition is a collapsed or blocked airway. ?There are three kinds of sleep apnea: ?Obstructive sleep apnea. This is caused by a blocked or collapsed airway. ?Central sleep apnea. This happens when the brain does not send the right signals to the muscles that control breathing. ?Mixed sleep apnea. This is a combination of obstructive and central sleep apnea. ?What increases the risk? ?Being overweight. ?Smoking. ?Having a small airway. ?Being older. ?Being male. ?Drinking alcohol. ?Taking medicines to calm yourself (sedatives or tranquilizers). ?Having family members with the condition. ?Having a tongue or tonsils that are larger than normal. ?What are the signs or symptoms? ?Trouble staying asleep. ?Loud snoring. ?Headaches in the morning. ?Waking up gasping. ?Dry mouth or sore throat in the morning. ?Being sleepy or tired during the day. ?If you are sleepy or tired during the day, you may also: ?Not be able to focus your mind (concentrate). ?Forget things. ?Get angry a lot and have mood swings. ?Feel sad (depressed). ?Have changes in your personality. ?Have less interest in sex, if you are male. ?Be unable to have an erection, if you are male. ?How is this treated? ? ?Sleeping on your side. ?Using a medicine to get rid of mucus in your nose (decongestant). ?Avoiding the use of alcohol, medicines to help you relax, or certain pain medicines (narcotics). ?Losing weight, if needed. ?Changing your diet. ?Quitting smoking. ?Using a machine to open your airway while  you sleep, such as: ?An oral appliance. This is a mouthpiece that shifts your lower jaw forward. ?A CPAP device. This device blows air through a mask when you breathe out (exhale). ?An EPAP device. This has valves that you put in each nostril. ?A BIPAP device. This device blows air through a mask when you breathe in (inhale) and breathe out. ?Having surgery if other treatments do not work. ?Follow these instructions at home: ?Lifestyle ?Make changes that your doctor recommends. ?Eat a healthy diet. ?Lose weight if needed. ?Avoid alcohol, medicines to help you relax, and some pain medicines. ?Do not smoke or use any products that contain nicotine or tobacco. If you need help quitting, ask your doctor. ?General instructions ?Take over-the-counter and prescription medicines only as told by your doctor. ?If you were given a machine to use while you sleep, use it only as told by your doctor. ?If you are having surgery, make sure to tell your doctor you have sleep apnea. You may need to bring your device with you. ?Keep all follow-up visits. ?Contact a doctor if: ?The machine that you were given to use during sleep bothers you or does not seem to be working. ?You do not get better. ?You get worse. ?Get help right away if: ?Your chest hurts. ?You have trouble breathing in enough air. ?You have an uncomfortable feeling in your back, arms, or stomach. ?You have trouble talking. ?One side of your body feels weak. ?A part of your face is hanging down. ?These symptoms may be an emergency. Get help right away. Call your local emergency services (911 in the [place] ?Do not wait to see if  the symptoms will go away. ?Do not drive yourself to the hospital. ?Summary ?This condition affects breathing during sleep. ?The most common cause is a collapsed or blocked airway. ?The goal of treatment is to help you breathe normally while you sleep. ?This information is not intended to replace advice given to you by your health care provider.  Make sure you discuss any questions you have with your health care provider. ?Document Revised: 07/28/2020 Document Reviewed: 11/28/2019 ?Elsevier Patient Education ? 2023 Elsevier Inc. ?Insomnia ?Insomnia is a sleep disorder that makes it difficult to fall asleep or stay asleep. Insomnia can cause fatigue, low energy, difficulty concentrating, mood swings, and poor performance at work or school. ?There are three different ways to classify insomnia: ?Difficulty falling asleep. ?Difficulty staying asleep. ?Waking up too early in the morning. ?Any type of insomnia can be long-term (chronic) or short-term (acute). Both are common. Short-term insomnia usually lasts for 3 months or less. Chronic insomnia occurs at least three times a week for longer than 3 months. ?What are the causes? ?Insomnia may be caused by another condition, situation, or substance, such as: ?Having certain mental health conditions, such as anxiety and depression. ?Using caffeine, alcohol, tobacco, or drugs. ?Having gastrointestinal conditions, such as gastroesophageal reflux disease (GERD). ?Having certain medical conditions. These include: ?Asthma. ?Alzheimer's disease. ?Stroke. ?Chronic pain. ?An overactive thyroid gland (hyperthyroidism). ?Other sleep disorders, such as restless legs syndrome and sleep apnea. ?Menopause. ?Sometimes, the cause of insomnia may not be known. ?What increases the risk? ?Risk factors for insomnia include: ?Gender. Females are affected more often than males. ?Age. Insomnia is more common as people get older. ?Stress and certain medical and mental health conditions. ?Lack of exercise. ?Having an irregular work schedule. This may include working night shifts and traveling between different time zones. ?What are the signs or symptoms? ?If you have insomnia, the main symptom is having trouble falling asleep or having trouble staying asleep. This may lead to other symptoms, such as: ?Feeling tired or having low  energy. ?Feeling nervous about going to sleep. ?Not feeling rested in the morning. ?Having trouble concentrating. ?Feeling irritable, anxious, or depressed. ?How is this diagnosed? ?This condition may be diagnosed based on: ?Your symptoms and medical history. Your health care provider may ask about: ?Your sleep habits. ?Any medical conditions you have. ?Your mental health. ?A physical exam. ?How is this treated? ?Treatment for insomnia depends on the cause. Treatment may focus on treating an underlying condition that is causing the insomnia. Treatment may also include: ?Medicines to help you sleep. ?Counseling or therapy. ?Lifestyle adjustments to help you sleep better. ?Follow these instructions at home: ?Eating and drinking ? ?Limit or avoid alcohol, caffeinated beverages, and products that contain nicotine and tobacco, especially close to bedtime. These can disrupt your sleep. ?Do not eat a large meal or eat spicy foods right before bedtime. This can lead to digestive discomfort that can make it hard for you to sleep. ?Sleep habits ? ?Keep a sleep diary to help you and your health care provider figure out what could be causing your insomnia. Write down: ?When you sleep. ?When you wake up during the night. ?How well you sleep and how rested you feel the next day. ?Any side effects of medicines you are taking. ?What you eat and drink. ?Make your bedroom a dark, comfortable place where it is easy to fall asleep. ?Put up shades or blackout curtains to block light from outside. ?Use a white noise machine to block noise. ?Keep  the temperature cool. ?Limit screen use before bedtime. This includes: ?Not watching TV. ?Not using your smartphone, tablet, or computer. ?Stick to a routine that includes going to bed and waking up at the same times every day and night. This can help you fall asleep faster. Consider making a quiet activity, such as reading, part of your nighttime routine. ?Try to avoid taking naps during the day  so that you sleep better at night. ?Get out of bed if you are still awake after 15 minutes of trying to sleep. Keep the lights down, but try reading or doing a quiet activity. When you feel sleepy, go back to be

## 2021-04-21 NOTE — Addendum Note (Signed)
Addended by: Larey Seat on: 04/21/2021 12:24 PM ? ? Modules accepted: Orders ? ?

## 2021-04-25 ENCOUNTER — Telehealth (HOSPITAL_COMMUNITY): Payer: Self-pay | Admitting: *Deleted

## 2021-04-25 NOTE — Telephone Encounter (Signed)
Pt left vm stating his disability paperwork had not been sent back to [employer]. Pt said he did not receive a paycheck and asked that we send in paperwork asap. ? ?Routed to [employer] ?

## 2021-04-29 MED ORDER — ENTRESTO 97-103 MG PO TABS
1.0000 | ORAL_TABLET | Freq: Two times a day (BID) | ORAL | 3 refills | Status: DC
Start: 2021-04-29 — End: 2021-05-20

## 2021-04-29 NOTE — Telephone Encounter (Signed)
Disability forms completed, signed by Dr Aundra Dubin, and faxed into [employer] on 4/27, attempted to notify pt, no answer ?

## 2021-05-05 ENCOUNTER — Telehealth: Payer: Self-pay | Admitting: Internal Medicine

## 2021-05-05 NOTE — Telephone Encounter (Signed)
Patient was contacted to reschedule f/u from ICD Implant due to a change in Dr. Odessa Fleming schedule.  ?The patient states that he will need documentation sent to his LTD Representative Ebony Cargo to put in his case file. He also needs a copy of his medical records from the implantation forward to qualify for LTD.  ?The patient did not have a fax number but he does have a phone number for his case manager. Please call Ebony Cargo at [place] at [phone] ?

## 2021-05-09 NOTE — Telephone Encounter (Signed)
Spoke with pt who states he has paperwork that must be completed for his disability payment.  Pt advised he will need to bring paperwork to office to be logged in and completed.  Pt states he has not been paid in 2 months and does not have money to pay to have paperwork completed.  Pt encouraged to come by the office and bring paperwork and discuss with our office manager.  Pt states he is beyond the point of stress because he is getting ready to be homeless and have his phone turned off.  He states he is stressed to the point of contemplating suicide.  Pt asked if he has thought how he might hurt himself.  Pt states he does not have a plan.  Questioned pt as to if he has ever discussed these feelings with anyone and he says no.  Suicide hotline number of 57 provided to pt and encouraged him to contact the number as soon as he and RN hang up.  Pt states he is not calling anyone but will bring papers to the office tomorrow 05/10/21.  RN reiterated her concern for the pt and again encouraged pt to contact the 988 number to speak with someone who can listen and maybe provide some additional resources for this pt.  Pt again states he is not calling and will bring papers to by to be completed.  Pt disconnected the call. ?

## 2021-05-10 ENCOUNTER — Telehealth (HOSPITAL_COMMUNITY): Payer: Self-pay | Admitting: Licensed Clinical Social Worker

## 2021-05-10 NOTE — Telephone Encounter (Signed)
CSW received request to contact patient who is struggling with financial concerns, possible homelessness and his phone is about to be turned off. CSW attempted to contact patient with no success and unable to leave voicemail. CSW will attempt again later. [name], [place], [place] ? ?

## 2021-05-16 ENCOUNTER — Telehealth (HOSPITAL_COMMUNITY): Payer: Self-pay | Admitting: Licensed Clinical Social Worker

## 2021-05-16 NOTE — Progress Notes (Signed)
Advanced Heart Failure Clinic Note    PCP: Raelyn Mora, MD PCP-Cardiologist: Mertie Moores, MD  HF Cardiologist: Dr. Coralee Rud [name] is a 56 y.o. with history of CAD, HTN, type 2 diabetes, OSA but not on CPAP and ischemic CMP presents for followup of CAD and CHF.  Patient was admitted in 5/19 with unstable angina.  Cath showed 90% proximal stenosis of nondominant RCA and 40% stenosis proximal LAD.  Echo showed EF 60-65%.  He was managed medically.  He was lost to followup as an outpatient until 9/22.    Transferred from [facility] to [facility] on 09/19/20 with anterior STEMI and cardiogenic shock. He was hypotensive on arrival and started on norepinephrine. Coronary angiogram demonstrated 90% thrombotic stenosis in mid LAD treated with PCI/DES, suspected embolic disease causing occlusion in distal LAD, chronic OM1 occlusion with collaterals, 90% proximal non-dominant RCA. RHC showed mean RA 9, PA 26/11, mean PCWP 16 with LVEDP 23, CI 2.46. Echo 9/22 with LVEF of 25-30%, WMA consistent with large LAD territory infarct, no LV thrombus, RV okay, RVSP 30 mmHg, no MR.  Diuresed with IV lasix. Initiated on GDMT with Entresto, empagliflozin, and spironolactone. BP too soft for beta blocker. LifeVest placed prior to discharge; discharge weight 220 lbs.    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   Follow up with 2/23 stable NYHA II symptoms, carvedilol increased. Referred to CR at [facility]   S/p ICD implant 2/23.  Today he returns for HF follow up. Overall feeling poor. Remains depressed, having feelings of hopelessness. No active plans to hurt himself. Has not seen psychiatrist in a couple years. He has mild dyspnea walking on flat ground, gets tired easily. Occasional positional dizziness, no falls. Denies CP, palpitations, edema, or PND/Orthopnea. Appetite ok. No fever or chills. Weight at home 300 pounds. Not taking Entresto x 3 weeks. Awaiting  CPAP.  ReDs: 37%  Device interrogation (personally reviewed): HL Score 1, 3.6 hrs/day activity, average HR 76  Labs (9/22): LDL 105 Labs (11/22): K 3.8, creatinine 0.64 Labs (1/23): K 4.4, creatinine 0.66 Labs (4/23): K 4.3, creatinine 0.67  PMH: 1. HTN 2. Type 2 diabetes 3. OSA: Does not have CPAP.  4. CAD: Unstable angina in 5/19, cath with 90% proximal nondominant small RCA.  - 9/22 anterior STEMI: 90% thrombotic mid LAD stenosis treated with DES, total occlusion of distal LAD likely by embolic event, totally occluded OM1 with collaterals, 90% proximal stenosis nondominant RCA.  5. Chronic systolic CHF: Ischemic cardiomyopathy.   - Echo (9/22): EF 25-30%, LAD territory WMAs, RV ok.  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   Current Outpatient Medications  Medication Sig Dispense Refill   acetaminophen (TYLENOL) 325 MG tablet Take 650 mg by mouth every 6 (six) hours as needed for moderate pain.     aspirin 81 MG EC tablet Take 1 tablet (81 mg total) by mouth daily. 30 tablet 11   carvedilol (COREG) 6.25 MG tablet Take 1 tablet (6.25 mg total) by mouth 2 (two) times daily. 60 tablet 11   empagliflozin (JARDIANCE) 25 MG TABS tablet Take 12.5 mg (1/2 tablet) daily 45 tablet 3   ezetimibe (ZETIA) 10 MG tablet Take 1 tablet (10 mg total) by mouth daily. 90 tablet 3   furosemide (LASIX) 20 MG tablet Take 2 tablets (40 mg total) by mouth daily. 60 tablet 6   glipiZIDE (GLUCOTROL) 10 MG tablet Take 10 mg by  mouth 2 (two) times daily before a meal.      metFORMIN (GLUCOPHAGE) 500 MG tablet Take 1,000 mg by mouth 2 (two) times daily with a meal.      nitroGLYCERIN (NITROSTAT) 0.4 MG SL tablet Place 1 tablet (0.4 mg total) under the tongue every 5 (five) minutes x 3 doses as needed for chest pain. 25 tablet 0   potassium chloride SA (KLOR-CON M) 20 MEQ tablet Take 1 tablet (20 mEq total) by mouth 2 (two) times daily. 30 tablet 5   rosuvastatin  (CRESTOR) 40 MG tablet Take 1 tablet (40 mg total) by mouth daily. 30 tablet 1   Semaglutide,0.25 or 0.5MG /DOS, 2 MG/1.5ML SOPN Inject 0.5 mg as directed every Monday.     spironolactone (ALDACTONE) 25 MG tablet Take 1 tablet (25 mg total) by mouth at bedtime. 30 tablet 5   tamsulosin (FLOMAX) 0.4 MG CAPS capsule Take 0.4 mg by mouth daily.     ticagrelor (BRILINTA) 90 MG TABS tablet Take 1 tablet (90 mg total) by mouth 2 (two) times daily. 60 tablet 11   sacubitril-valsartan (ENTRESTO) 97-103 MG Take 1 tablet by mouth 2 (two) times daily. (Patient not taking: Reported on 05/20/2021) 180 tablet 3   No current facility-administered medications for this encounter.   No Known Allergies  Social History   Socioeconomic History   Marital status: Divorced    Spouse name: Not on file   Number of children: Not on file   Years of education: Not on file   Highest education level: Not on file  Occupational History   Not on file  Tobacco Use   Smoking status: Former    Packs/day: 1.00    Years: 30.00    Pack years: 30.00    Types: Cigarettes    Quit date: 01/02/1997    Years since quitting: 24.3   Smokeless tobacco: Former  Scientific laboratory technician Use: Never used  Substance and Sexual Activity   Alcohol use: Yes    Alcohol/week: 2.0 standard drinks    Types: 2 Cans of beer per week   Drug use: Never   Sexual activity: Not on file  Other Topics Concern   Not on file  Social History Narrative   Lives alone.  Still has support of ex-wife and other relatives   Social Determinants of Health   Financial Resource Strain: High Risk   Difficulty of Paying Living Expenses: Hard  Food Insecurity: No Food Insecurity   Worried About Charity fundraiser in the Last Year: Never true   Ran Out of Food in the Last Year: Never true  Transportation Needs: No Transportation Needs   Lack of Transportation (Medical): No   Lack of Transportation (Non-Medical): No  Physical Activity: Not on file  Stress:  Not on file  Social Connections: Not on file  Intimate Partner Violence: Not on file   Family History  Problem Relation Age of Onset   Hypertension Father    BP 140/88   Pulse 71   Wt (!) 138.6 kg (305 lb 9.6 oz)   SpO2 96%   BMI 40.32 kg/m   Wt Readings from Last 3 Encounters:  05/20/21 (!) 138.6 kg (305 lb 9.6 oz)  04/21/21 133.4 kg (294 lb)  04/08/21 131.8 kg (290 lb 9.6 oz)   PHYSICAL EXAM: General:  NAD. No resp difficulty, walked into clinic. HEENT: Normal Neck: Supple. thick neck. Carotids 2+ bilat; no bruits. No lymphadenopathy or thryomegaly appreciated. Cor: PMI nondisplaced.  Regular rate & rhythm. No rubs, gallops or murmurs. Lungs: Clear Abdomen: Obese, nontender, nondistended. No hepatosplenomegaly. No bruits or masses. Good bowel sounds. Extremities: No cyanosis, clubbing, rash, edema Neuro: Alert & oriented x 3, cranial nerves grossly intact. Moves all 4 extremities w/o difficulty. Affect pleasant.  ASSESSMENT & PLAN: 1. CAD: History of unstable angina 5/19, medically managed (90% stenosis nondominant RCA).  Admitted with acute anterior MI in 9/22.  Cath with 90% thrombotic stenosis mid LAD, suspected embolic occlusion distal LAD, CTO OM1 with collaterals, 90% nondominant RCA.  Patient treated with DES to mid LAD.  No chest pain.  - Continue ticagrelor and ASA 81.  - Crestor 40 mg daily + Repatha.  2. Chronic systolic CHF: Ischemic cardiomyopathy.  Echo in 9/22 showed EF 25-30%, LAD wall motion abnormalities, no LV thrombus, normal RV. Echo in 12/22 showed EF 30% with apical aneurysm but no thrombus.  He does not appear significantly volume overloaded on exam, but weight up 10 lbs, ReDs 37%. NYHA class II early III symptoms.  - Restart Entresto 97/103 mg bid. BMET today, repeat in 10-14 days. - Continue Lasix 40 mg daily. - Continue empagliflozin 12.5 mg daily.  - Continue spironolactone 25 daily.  - Continue Coreg 6.25 mg bid.   - He now has ICD. - He has been  referred to CR at [facility]. He has not started yet. 3. Diabetes: [facility]ed at [facility]. 4. OSA: Strong suspicion.  He has seen Neurology/Sleep Medicine and has sleep study arranged. 5. SDOH: HFSW helping with financial issues while disability gets sorted out. 6. Depression: He endorsed SI & feelings of hopelessness during visit today, no active plan to carry out. Engage HFSW.  - Discussed ED precautions. - I asked him to schedule his PCP soon, may need referral to psychiatrist. He receives his care at the [place].  He does not feel he can return to work. Disability paperwork has been completed and submitted by Heather.   Follow up in 2 months with Dr. Aundra Dubin, as scheduled.  [place], FNP 05/20/21

## 2021-05-16 NOTE — Telephone Encounter (Signed)
CSW has attempted multiple times to reach patient and was able to make contact today. Patient shared challenges of getting his long term disability and needed medical records to the company for approval of benefits. CSW discussed need to have request for records sent from the Disability company to MD office. Patient reports he has the paperwork and will drop off to MD office tomorrow. Patient also shared multiple financial issues with no paycheck for the past two months. CSW asked patient to bring bills to clinic appointment in the HF clinic on Friday and CSW will meet with patient at that time to assist with some resources. Patient verbalizes understanding and grateful for the support. CSW will follow up with patient on Friday. Lasandra Beech, LCSW, CCSW-MCS [phone] ? ?

## 2021-05-17 ENCOUNTER — Ambulatory Visit (INDEPENDENT_AMBULATORY_CARE_PROVIDER_SITE_OTHER): Payer: No Typology Code available for payment source | Admitting: Neurology

## 2021-05-17 DIAGNOSIS — E66812 Obesity, class 2: Secondary | ICD-10-CM

## 2021-05-17 DIAGNOSIS — I5041 Acute combined systolic (congestive) and diastolic (congestive) heart failure: Secondary | ICD-10-CM

## 2021-05-17 DIAGNOSIS — I25110 Atherosclerotic heart disease of native coronary artery with unstable angina pectoris: Secondary | ICD-10-CM

## 2021-05-17 DIAGNOSIS — Z9189 Other specified personal risk factors, not elsewhere classified: Secondary | ICD-10-CM

## 2021-05-17 DIAGNOSIS — G4736 Sleep related hypoventilation in conditions classified elsewhere: Secondary | ICD-10-CM | POA: Diagnosis not present

## 2021-05-17 DIAGNOSIS — R0601 Orthopnea: Secondary | ICD-10-CM

## 2021-05-17 DIAGNOSIS — I5022 Chronic systolic (congestive) heart failure: Secondary | ICD-10-CM

## 2021-05-19 ENCOUNTER — Encounter: Payer: Self-pay | Admitting: Internal Medicine

## 2021-05-20 ENCOUNTER — Ambulatory Visit (HOSPITAL_COMMUNITY)
Admission: RE | Admit: 2021-05-20 | Discharge: 2021-05-20 | Disposition: A | Discharge status: Home / Self Care | Payer: Medicaid Other | Source: Ambulatory Visit | Attending: Family Medicine | Admitting: Family Medicine

## 2021-05-20 ENCOUNTER — Telehealth (HOSPITAL_COMMUNITY): Payer: Self-pay | Admitting: Surgery

## 2021-05-20 ENCOUNTER — Encounter (HOSPITAL_COMMUNITY): Payer: Self-pay

## 2021-05-20 ENCOUNTER — Ambulatory Visit (INDEPENDENT_AMBULATORY_CARE_PROVIDER_SITE_OTHER): Payer: Self-pay

## 2021-05-20 VITALS — BP 140/88 | HR 71 | Wt 305.6 lb

## 2021-05-20 DIAGNOSIS — Z955 Presence of coronary angioplasty implant and graft: Secondary | ICD-10-CM | POA: Diagnosis not present

## 2021-05-20 DIAGNOSIS — E1159 Type 2 diabetes mellitus with other circulatory complications: Secondary | ICD-10-CM

## 2021-05-20 DIAGNOSIS — G4733 Obstructive sleep apnea (adult) (pediatric): Secondary | ICD-10-CM | POA: Diagnosis not present

## 2021-05-20 DIAGNOSIS — F32A Depression, unspecified: Secondary | ICD-10-CM | POA: Insufficient documentation

## 2021-05-20 DIAGNOSIS — I255 Ischemic cardiomyopathy: Secondary | ICD-10-CM | POA: Insufficient documentation

## 2021-05-20 DIAGNOSIS — E119 Type 2 diabetes mellitus without complications: Secondary | ICD-10-CM | POA: Insufficient documentation

## 2021-05-20 DIAGNOSIS — Z79899 Other long term (current) drug therapy: Secondary | ICD-10-CM | POA: Insufficient documentation

## 2021-05-20 DIAGNOSIS — I110 Hypertensive heart disease with heart failure: Secondary | ICD-10-CM | POA: Insufficient documentation

## 2021-05-20 DIAGNOSIS — Z7902 Long term (current) use of antithrombotics/antiplatelets: Secondary | ICD-10-CM | POA: Diagnosis not present

## 2021-05-20 DIAGNOSIS — I252 Old myocardial infarction: Secondary | ICD-10-CM | POA: Diagnosis not present

## 2021-05-20 DIAGNOSIS — R570 Cardiogenic shock: Secondary | ICD-10-CM

## 2021-05-20 DIAGNOSIS — I2510 Atherosclerotic heart disease of native coronary artery without angina pectoris: Secondary | ICD-10-CM | POA: Insufficient documentation

## 2021-05-20 DIAGNOSIS — I5022 Chronic systolic (congestive) heart failure: Secondary | ICD-10-CM | POA: Diagnosis not present

## 2021-05-20 DIAGNOSIS — Z139 Encounter for screening, unspecified: Secondary | ICD-10-CM

## 2021-05-20 LAB — BASIC METABOLIC PANEL
Anion gap: 8 (ref 5–15)
BUN: 11 mg/dL (ref 6–20)
CO2: 26 mmol/L (ref 22–32)
Calcium: 8.6 mg/dL — ABNORMAL LOW (ref 8.9–10.3)
Chloride: 100 mmol/L (ref 98–111)
Creatinine, Ser: 0.71 mg/dL (ref 0.61–1.24)
GFR, Estimated: >60 mL/min (ref >60)
Glucose, Bld: 477 mg/dL — ABNORMAL HIGH (ref 70–99)
Potassium: 4.8 mmol/L (ref 3.5–5.1)
Sodium: 134 mmol/L — ABNORMAL LOW (ref 135–145)

## 2021-05-20 LAB — CUP PACEART REMOTE DEVICE CHECK
Battery Remaining Longevity: 174 mo
Battery Remaining Percentage: 100 %
Brady Statistic RV Percent Paced: 0 %
Date Time Interrogation Session: 20230519014100
HighPow Impedance: 84 Ohm
Implantable Lead Implant Date: 20230215
Implantable Lead Location: 753860
Implantable Lead Model: 138
Implantable Lead Serial Number: 302859
Implantable Pulse Generator Implant Date: 20230215
Lead Channel Impedance Value: 519 Ohm
Lead Channel Pacing Threshold Amplitude: 0.8 V
Lead Channel Pacing Threshold Pulse Width: 0.4 ms
Lead Channel Setting Pacing Amplitude: 3.5 V
Lead Channel Setting Pacing Pulse Width: 0.4 ms
Lead Channel Setting Sensing Sensitivity: 0.5 mV
Pulse Gen Serial Number: 215773

## 2021-05-20 MED ORDER — ENTRESTO 97-103 MG PO TABS
1.0000 | ORAL_TABLET | Freq: Two times a day (BID) | ORAL | 3 refills | Status: DC
Start: 2021-05-20 — End: 2022-05-26

## 2021-05-20 NOTE — Telephone Encounter (Signed)
-----   Message from Rafael Bihari, [place] sent at 05/20/2021 11:55 AM EDT ----- Blood glucose very elevated, need PCP follow up to address

## 2021-05-20 NOTE — Progress Notes (Signed)
Heart and Vascular Care Navigation  05/20/2021  [place] 02-Mar-1965 [id number]  Reason for Referral: Patient seen in HF Clinic to assist with financial needs   Engaged with patient face to face for follow up visit for Heart and Vascular Care Coordination.                                                                                                   Assessment: Patient is a 56 yo male who states he lives alone in a mobile home. Patient reports he was receiving short term disability and was let go from his job immediately after his benefits ended. Patient states he is still waiting for approval for the long term disability and currently has on $165 monthly income from a [place] disability check.                                      HRT/VAS Care Coordination     Patients Home Cardiology Office Heart Failure Clinic   Outpatient Care Team Social Worker   Social Worker Name: [name], [place] [phone]   Living arrangements for the past 2 months Mobile Home   Lives with: Self   Patient Current Insurance Coverage Self-Pay   Patient Has Concern With Paying Medical Bills Yes   Home Assistive Devices/Equipment None   DME Agency [employer]       Social History:                                                                             SDOH Screenings   Alcohol Screen: Not on file  Depression (PHQ2-9): Not on file  Financial Resource Strain: High Risk   Difficulty of Paying Living Expenses: Very hard  Food Insecurity: Food Insecurity Present   Worried About Charity fundraiser in the Last Year: Sometimes true   Ran Out of Food in the Last Year: Sometimes true  Housing: High Risk   Last Housing Risk Score: 2  Physical Activity: Not on file  Social Connections: Not on file  Stress: Not on file  Tobacco Use: Medium Risk   Smoking Tobacco Use: Former   Smokeless Tobacco Use: Former   Passive Exposure: Not on Pensions consultant Needs: No Transportation Needs   Lack of  Transportation (Medical): No   Lack of Transportation (Non-Medical): No    SDOH Interventions: Financial Resources:  Financial Strain Interventions: Other (Comment) (H&V Patient Care Fund)   Food Insecurity:  Food Insecurity Interventions: Other (Comment) (Gift card program)  Housing Insecurity:  Housing Interventions: Other (Comment) (Patient Care Fund)  Transportation:   Transportation Interventions: Intervention Not Indicated    Follow-up plan:  CSW assisted with payment of his utility bill and provided  some gift cards for food, gas and to pay his phone bill. CSW discussed resources for assistance with rent payment in the future. Patient will follow up with requested paperwork for assistance through the Patient Care Fund. Patient grateful for the assistance and states he feels a bit relieved with the financial support. Patient will call the LTD [employer] and confirm receipt of medical records sent form the clinic a few weeks back. CSW available as needed. [name], [place], [place]

## 2021-05-20 NOTE — Telephone Encounter (Signed)
Patient contacted to review results and recommendations per provider.  He tells me that he has been trying to contact PCP today to schedule an appt.

## 2021-05-20 NOTE — Progress Notes (Signed)
ReDS Vest / Clip - 05/20/21 0800       ReDS Vest / Clip   Station Marker D    Ruler Value 41    ReDS Value Range Moderate volume overload    ReDS Actual Value 37

## 2021-05-20 NOTE — Progress Notes (Signed)
Medication Samples have been provided to the patient.  Drug name: ENTRESTO       Strength: 49-51MG         Qty: 5  LOT: [id number]   Exp.Date: 01/2023  Dosing instructions: take 2 tablets(to equal 97-103mg ) po bid  The patient has been instructed regarding the correct time, dose, and frequency of taking this medication, including desired effects and most common side effects.   Tommy Liu Tommy Liu 9:20 AM 05/20/2021

## 2021-05-20 NOTE — Patient Instructions (Signed)
Labs done today. We will contact you only if your labs are abnormal.  RESTART Entresto 97-103mg  (1 tablet) by mouth 2 times daily.  No other medication changes were made. Please continue all current medications as prescribed.  PLEASE FOLLOW UP WITH YOUR PRIMARY CARE PHYSICIAN, DR. Olene Floss ASAP.  IF YOUR SUICIDE THOUGHTS GET WORSE PLEASE GO TO THE ER IMMEDIATELY.    Your physician recommends that you schedule a follow-up appointment in: 10-14 days for a lab only appointment and keep your scheduled follow up appointment with Dr. Shirlee Latch.  If you have any questions or concerns before your next appointment please send [place] a message through [place] or call our office at [phone].    TO LEAVE A MESSAGE FOR THE NURSE SELECT OPTION 2, PLEASE LEAVE A MESSAGE INCLUDING: YOUR NAME DATE OF BIRTH CALL BACK NUMBER REASON FOR CALL**this is important as we prioritize the call backs  YOU WILL RECEIVE A CALL BACK THE SAME DAY AS LONG AS YOU CALL BEFORE 4:00 PM   Do the following things EVERYDAY: Weigh yourself in the morning before breakfast. Write it down and keep it in a log. Take your medicines as prescribed Eat low salt foods--Limit salt (sodium) to 2000 mg per day.  Stay as active as you can everyday Limit all fluids for the day to less than 2 liters   At the [facility], you and your health needs are our priority. As part of our continuing mission to provide you with exceptional heart care, we have created designated Provider Care Teams. These Care Teams include your primary Cardiologist (physician) and Advanced Practice Providers (APPs- Physician Assistants and Nurse Practitioners) who all work together to provide you with the care you need, when you need it.   You may see any of the following providers on your designated Care Team at your next follow up: Dr Arvilla Meres Dr Carron Curie, NP Robbie Lis, [place] Karle Plumber, PharmD   Please be sure to  bring in all your medications bottles to every appointment.

## 2021-05-20 NOTE — Addendum Note (Signed)
Encounter addended by: Marcy Siren, LCSW on: 05/20/2021 2:46 PM  Actions taken: Flowsheet accepted, Pend clinical note, Clinical Note Signed

## 2021-05-21 NOTE — Addendum Note (Signed)
Addended by: Larey Seat on: 05/21/2021 03:36 PM   Modules accepted: Orders

## 2021-05-21 NOTE — Procedures (Signed)
PATIENT'S NAME:  Tommy Liu, Tommy Liu DOB:      [birth year]      MR#:    [id number]     DATE OF RECORDING: 05/17/2021 [place] REFERRING M.D.:  [facility] , Idalia Needle, MD Study Performed:  Split-Night Titration Study HISTORY:  04-21-21 Referred by [facility] physician.  Tommy Liu states he gets about 3 hours of sleep. He will wake up and has a hard time going back to sleep. Sometimes he will have a hard time falling asleep. PSG in lab took place several years ago with the [place] (before Myocardial infarction and stent, MI in 09-19-2020), and MI in 2019). Since February 2023, he has a pacemaker in situ. CHF with EF at 25%.  The patient had the first sleep study in the year 2006, and received a diagnosis/ result of OSA at the [place], [place].  He was told he had sleep apnea but never started CPAP.  Tommy Liu has a past medical history of Asthma, CAD in native artery, Class 2 morbid obesity, Diabetes mellitus without complication (HCC), hypercholesterolemia, Hypertension, and untreated Obstructive sleep apnea.    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  The patient endorsed the Epworth Sleepiness Scale at 1 point, FSS at 33/62 points.   GDS at 12/15 points (!)  The patient's weight 294 pounds with a height of 73 (inches), resulting in a BMI of 38.9 kg/m2. The patient's neck circumference measured 20.5 inches.  CURRENT MEDICATIONS: Tylenol, Aspirin, Coreg, Jardiance, Zetia, Lasix, Glucotrol, Glucophage, Nitrostat, Klor-Con M, Crestor, Entresto, Semaglutide, Aldactone, Flomax, Brilinta    PROCEDURE:  This is a multichannel digital polysomnogram utilizing the Somnostar 11.2 system.  Electrodes and sensors were applied and monitored per AASM  Specifications.   EEG, EOG, Chin and Limb EMG, were sampled at 200 Hz.  ECG, Snore and Nasal Pressure, Thermal Airflow, Respiratory Effort, CPAP Flow and Pressure, Oximetry was sampled at 50 Hz. Digital video and audio were recorded.      BASELINE STUDY WITHOUT CPAP RESULTS:  Lights Out was at 20:38 and Lights On at 04:59.  Total recording time (TRT) was 166.5, with a total sleep time (TST) of 123 minutes.   The patient's sleep latency was 26 minutes.  REM latency was 0 minutes.  The sleep efficiency was 73.9 %.    SLEEP ARCHITECTURE: WASO (Wake after sleep onset) was 8.5 minutes, Stage N1 was 3 minutes, Stage N2 was 120 minutes, Stage N3 was 0 minutes and Stage R (REM sleep) was 0 minutes.  The percentages were Stage N1 2.4%, Stage N2 97.6%, Stage N3 0% and Stage R (REM sleep) 0%.    RESPIRATORY ANALYSIS:  There were a total of 196 respiratory events:  144 obstructive apneas, 0 central apneas and 0 mixed apneas with a total of 144 apneas and an apnea index (AI) of 70.2. There were 52 hypopneas with a hypopnea index of 25.4.      The total APNEA/HYPOPNEA INDEX (AHI) was 95.6 /hour There was no REM sleep and 104 events were seen  in NREM. The patient spent 358 minutes sleep time in the supine position 51 minutes in non-supine. The supine AHI was 96.8 /hour versus a non-supine AHI of 88.3 /hour.  OXYGEN SATURATION & C02:  The wake  baseline 02 saturation was 92%, with the NADIR being 67%. Time spent below 89% saturation equaled 120 minutes. The arousals were noted as: 2 were spontaneous, 0 were associated with PLMs, 105 were associated with respiratory events. EKG was in keeping with normal sinus rhythm (NSR)  Severe Apnea (OSA) and severe hypoxemia were qualifications for SPLIT night protocol.   TITRATION STUDY WITH CPAP RESULTS:   CPAP was initiated under a FFM Simplus medium at 7 cmH20 with heated humidity per AASM split night standards and pressure was advanced to final 18 cmH20 because of  hypopneas, apneas and desaturations.  The patient only slept 6 minutes at a PAP pressure of 18 cmH20, there was a reduction of the AHI to 0.0/hour.  He slept 98.5 minutes under 17 cm water pressure and with an AHI of 3.7/h.   Total recording time (TRT) was 335.5 minutes, with a total sleep time (TST) of 286 minutes. The patient's sleep latency was 22 minutes. REM latency was 91.5 minutes.  The sleep efficiency was 85.2 %.    SLEEP ARCHITECTURE: Wake after sleep was 27.5 minutes, Stage N1 47.5 minutes, Stage N2 172 minutes, Stage N3 5 minutes and Stage R (REM sleep) 61.5 minutes. The percentages were: Stage N1 16.6%, Stage N2 60.1%, Stage N3 1.7% and Stage R (REM sleep) 21.5%. The sleep architecture was notable for sustained sleep rebounding.  The arousals were noted as: 25 were spontaneous, 0 were associated with PLMs, 7 were associated with respiratory events.  RESPIRATORY ANALYSIS:  There were a total of 97 respiratory events: 1 obstructive apnea, 1 central apnea and 0 mixed apneas with a total of 2 apneas and an apnea index (AI) of .4.  There were 95 hypopneas with a hypopnea index of 19.9 /hour.   The total APNEA/HYPOPNEA INDEX (AHI) was 20.3 /hour.  8 events occurred in REM sleep and 89 events in NREM. The REM AHI was 7.8 /hour versus a non-REM AHI of 23.8 /hour. REM sleep was achieved on a pressure of 16 cm/h2o (AHI was 13.8.) The patient spent 88% of total sleep time in the supine position. The supine AHI was 23.1 /hour, versus a non-supine AHI of 0.0/hour.  OXYGEN SATURATION & C02:  The wake baseline 02 saturation was 96%, with the lowest being 78%.  Time spent below 89% saturation equaled 23 minutes. The patient had a total of 0 Periodic Limb Movements.   POLYSOMNOGRAPHY IMPRESSION :   Severe Hypoxia (nadir 65%) and severest Obstructive Sleep Apnea (OSA with an AHI of 96.6/h) responded to CPAP at 17 and 18 cm water with a SIMPLUS medium FFM.   RECOMMENDATIONS: Autotitration ResMed CPAP  device set for pressures between 9 cm through 20 cm water with 2 cm EPR , heated humidity, and mask of choice- The PSG tech used a FFM Simplus in medium,  Please advise patient to contact DME within 30 days if mask is not working out.  Insurance definition of CPAP Compliance is a minimum user time (each night !) of 4 hours, better more.    A follow up appointment will be scheduled in the Sleep Clinic at [facility].     I certify that I have reviewed the entire raw data recording prior to the issuance of this report in accordance with the Standards of Accreditation of the American Academy of Sleep Medicine (AASM)   Melvyn Novas, M.D. Diplomat, Biomedical engineer of Neurology  Diplomat, Biomedical engineer of Sleep Medicine Wellsite geologist, [employer] [facility] [employer]

## 2021-05-24 ENCOUNTER — Ambulatory Visit (INDEPENDENT_AMBULATORY_CARE_PROVIDER_SITE_OTHER): Payer: Self-pay | Admitting: Internal Medicine

## 2021-05-24 ENCOUNTER — Encounter: Payer: Self-pay | Admitting: Internal Medicine

## 2021-05-24 VITALS — BP 120/80 | HR 71 | Ht 74.0 in | Wt 296.0 lb

## 2021-05-24 DIAGNOSIS — E1169 Type 2 diabetes mellitus with other specified complication: Secondary | ICD-10-CM

## 2021-05-24 DIAGNOSIS — E785 Hyperlipidemia, unspecified: Secondary | ICD-10-CM

## 2021-05-24 DIAGNOSIS — I255 Ischemic cardiomyopathy: Secondary | ICD-10-CM

## 2021-05-24 DIAGNOSIS — I5022 Chronic systolic (congestive) heart failure: Secondary | ICD-10-CM

## 2021-05-24 DIAGNOSIS — Z95810 Presence of automatic (implantable) cardiac defibrillator: Secondary | ICD-10-CM

## 2021-05-24 NOTE — Patient Instructions (Addendum)
Medication Instructions:  - Your physician has recommended you make the following change in your medication:   1) INCREASE Lasix (furosemide) 20 mg: - take 4 tablets (80 mg) by mouth once daily x 3 days  - then resume 2 tablets (40 mg) by mouth once daily   *If you need a refill on your cardiac medications before your next appointment, please call your pharmacy*   Lab Work: - none ordered  If you have labs (blood work) drawn today and your tests are completely normal, you will receive your results only by: MyChart Message (if you have MyChart) OR A paper copy in the mail If you have any lab test that is abnormal or we need to change your treatment, we will call you to review the results.   Testing/Procedures: - none ordered   Follow-Up: At [facility], you and your health needs are our priority.  As part of our continuing mission to provide you with exceptional heart care, we have created designated Provider Care Teams.  These Care Teams include your primary Cardiologist (physician) and Advanced Practice Providers (APPs -  Physician Assistants and Nurse Practitioners) who all work together to provide you with the care you need, when you need it.  We recommend signing up for the patient portal called "MyChart".  Sign up information is provided on this After Visit Summary.  MyChart is used to connect with patients for Virtual Visits (Telemedicine).  Patients are able to view lab/test results, encounter notes, upcoming appointments, etc.  Non-urgent messages can be sent to your provider as well.   To learn more about what you can do with MyChart, go to [email or web address].    Your next appointment:   9 month(s)  The format for your next appointment:   In Person  Provider:   Sherryl Manges, MD    Other Instructions N/a  Important Information About [place]

## 2021-05-24 NOTE — Progress Notes (Signed)
Patient Care Team: Wendi Maya, MD as PCP - General (Internal Medicine) Nahser, Deloris Ping, MD as PCP - Cardiology (Cardiology)   HPI  Tommy Liu is a 56 y.o. male seen in followup for ICD implanted 2/23 for primary prevention in setting of ICM and narrow QRS   The patient denies, nocturnal dyspnea.  There have been no palpitations, lightheadedness or syncope.  Complains of dyspnea associated with chest pain primarily at rest.  With exertion he is limited by fatigue.  Sleeps on 2 pillows without nocturnal dyspnea.  Recent sleep study is positive, he awaits CPAP.   Tolerating medications.   DATE TEST EF    5/19 LHC 60-65% LADp 40; RCAnondom-90  9/22 LHC   % LADm 90; LADd-T;CxOM1-T RCAnondom-90  9/22 Echo  25-30%    12/22 Echo 30% aneurysm    Date Cr K Hgb BNP  1/23 0.66 4.4 15.9    5/23 0.71 4.8  95 (4/23)     DM and HTN  Records and Results Reviewed   Past Medical History:  Diagnosis Date   CAD in native artery    a. Cath 05/2017 -  90% pRCA (small & nondominant) and 40% pLAD >> medical mangment    Diabetes mellitus without complication (HCC)    Hypertension    Morbid obesity (HCC)    Obstructive sleep apnea     Past Surgical History:  Procedure Laterality Date   CORONARY STENT INTERVENTION N/A 09/19/2020   Procedure: CORONARY STENT INTERVENTION;  Surgeon: Marykay Lex, MD;  Location: [facility] INVASIVE CV LAB;  Service: Cardiovascular;  Laterality: N/A;   CORONARY/GRAFT ACUTE MI REVASCULARIZATION N/A 09/19/2020   Procedure: Coronary/Graft Acute MI Revascularization;  Surgeon: Marykay Lex, MD;  Location[facility] INVASIVE CV LAB;  Service: Cardiovascular;  Laterality: N/A;   ICD IMPLANT N/A 02/16/2021   Procedure: ICD IMPLANT;  Surgeon: Duke Salvia, MD;  Location: [facility] INVASIVE CV LAB;  Service: Cardiovascular;  Laterality: N/A;   LEFT HEART CATH AND CORONARY ANGIOGRAPHY N/A 05/07/2017   Procedure: LEFT HEART CATH AND CORONARY ANGIOGRAPHY;  Surgeon:  Swaziland, Peter M, MD;  Location: [facility] INVASIVE CV LAB;  Service: Cardiovascular;  Laterality: N/A;   LEFT HEART CATH AND CORONARY ANGIOGRAPHY N/A 09/19/2020   Procedure: LEFT HEART CATH AND CORONARY ANGIOGRAPHY;  Surgeon: Marykay Lex, MD;  Location: [facility] INVASIVE CV LAB;  Service: Cardiovascular;  Laterality: N/A;   RIGHT HEART CATH N/A 09/19/2020   Procedure: RIGHT HEART CATH;  Surgeon: Marykay Lex, MD;  Location: [facility] INVASIVE CV LAB;  Service: Cardiovascular;  Laterality: N/A;   ULTRASOUND GUIDANCE FOR VASCULAR ACCESS  05/07/2017   Procedure: Ultrasound Guidance For Vascular Access;  Surgeon: Swaziland, Peter M, MD;  Location: [facility] INVASIVE CV LAB;  Service: Cardiovascular;;    Current Meds  Medication Sig   acetaminophen (TYLENOL) 325 MG tablet Take 650 mg by mouth every 6 (six) hours as needed for moderate pain.   aspirin 81 MG EC tablet Take 1 tablet (81 mg total) by mouth daily.   carvedilol (COREG) 6.25 MG tablet Take 1 tablet (6.25 mg total) by mouth 2 (two) times daily.   empagliflozin (JARDIANCE) 25 MG TABS tablet Take 12.5 mg (1/2 tablet) daily   ezetimibe (ZETIA) 10 MG tablet Take 1 tablet (10 mg total) by mouth daily.   furosemide (LASIX) 20 MG tablet Take 2 tablets (40 mg total) by mouth daily.   glipiZIDE (GLUCOTROL) 10 MG tablet Take 10 mg  by mouth 2 (two) times daily before a meal.    metFORMIN (GLUCOPHAGE) 500 MG tablet Take 1,000 mg by mouth 2 (two) times daily with a meal.    nitroGLYCERIN (NITROSTAT) 0.4 MG SL tablet Place 1 tablet (0.4 mg total) under the tongue every 5 (five) minutes x 3 doses as needed for chest pain.   potassium chloride SA (KLOR-CON M) 20 MEQ tablet Take 1 tablet (20 mEq total) by mouth 2 (two) times daily.   rosuvastatin (CRESTOR) 40 MG tablet Take 1 tablet (40 mg total) by mouth daily.   sacubitril-valsartan (ENTRESTO) 97-103 MG Take 1 tablet by mouth 2 (two) times daily.   Semaglutide,0.25 or 0.5MG /DOS, 2 MG/1.5ML SOPN Inject 0.5 mg as directed every  Monday.   spironolactone (ALDACTONE) 25 MG tablet Take 1 tablet (25 mg total) by mouth at bedtime.   tamsulosin (FLOMAX) 0.4 MG CAPS capsule Take 0.4 mg by mouth daily.   ticagrelor (BRILINTA) 90 MG TABS tablet Take 1 tablet (90 mg total) by mouth 2 (two) times daily.    No Known Allergies    Review of Systems negative except from HPI and PMH  Physical Exam  BP 120/80 (BP Location: Left Arm, Patient Position: Sitting, Cuff Size: Large)   Pulse 71   Ht 6\' 2"  (1.88 m)   Wt 296 lb (134.3 kg)   SpO2 98%   BMI 38.00 kg/m  Well developed and Morbidly obese in no acute distress HENT normal Neck supple with JVP-8 +HJR Clear Device pocket well healed; without hematoma or erythema.  There is no tethering  Regular rate and rhythm, no  gallop No  murmur Abd-soft with active BS No Clubbing cyanosis 1-2+ edema Skin-warm and dry A & Oriented  Grossly normal sensory and motor function  ECG sinus at 71 Intervals 15/10/38 Frequent PACs Q waves V2-V6 2 3 and F   Estimated Creatinine Clearance: 153.8 mL/min (by C-G formula based on SCr of 0.71 mg/dL).   Assessment and  Plan  Ischemic cardiomyopathy with an aneurysm   Congestive heart failure-chronic-systolic class IIb-3A  Implantable defibrillator-[employer]   Morbid obesity   Diabetes poorly controlled (hemoglobin A1c greater than 11)   Sleep apnea-untreated   Hyperlipidemia  Device function is normal.  No programming changes made.  Somewhat volume overloaded; we will increase his furosemide from 40 daily--80 daily x3 days and then resume.  Has recently been identified as sleep apnea.  He will start his CPAP.  May be some other issues.  Blood pressure is well controlled we will continue him on his guideline directed therapy with carvedilol 6.25, Entresto 97/103, spironolactone 25 and Jardiance at 12.5.   Current medicines are reviewed at length with the patient today .  The patient does not  have concerns  regarding medicines.

## 2021-05-25 ENCOUNTER — Telehealth: Payer: Self-pay | Admitting: Neurology

## 2021-05-25 NOTE — Telephone Encounter (Signed)
-----   Message from Melvyn Novas, MD sent at 05/21/2021  3:35 PM EDT ----- [facility] patient with severe sleep apnea and severe HYPOXIA- both responded to CPAP therapy.  POLYSOMNOGRAPHY IMPRESSION:   1. Severe Hypoxia (nadir 65%) and severest Obstructive Sleep Apnea (OSA with an AHI of 96.6/h) responded to CPAP at 17 and 18 cm water with a SIMPLUS medium FFM.   RECOMMENDATIONS: Autotitration ResMed CPAP device set for pressures between 9 cm through 20 cm water with 2 cm EPR , heated humidity, and mask of choice- The PSG tech used a FFM Simplus in medium,  Please advise patient to contact DME within 30 days if mask is not working out.  Insurance definition of CPAP Compliance is a minimum user time (each night !) of 4 hours, better more.    [facility] physician

## 2021-05-25 NOTE — Telephone Encounter (Signed)
I called pt. I advised pt that Dr. Vickey Huger reviewed their sleep study results and found that pt has severe sleep apnea. Dr. Vickey Huger recommends that pt starts auto CPAP machine. I reviewed PAP compliance expectations with the pt. Pt is agreeable to starting a CPAP. I advised pt that an order will be sent to a DME, [place], and [facility] [place] will call the pt within about one week after they file with the pt's insurance. [facility] [place] will show the pt how to use the machine, fit for masks, and troubleshoot the CPAP if needed. Advised the patient that we have found typically the patients follow up care with CPAP through [facility] but certainly if they would like to follow up with [place], we would like to make sure that we schedule a a initial cpap follow up 31-90 days from the date of getting the machine. A letter with all of this information in it will be mailed to the pt as a reminder. I verified with the pt that the address we have on file is correct. Pt verbalized understanding of results. Pt had no questions at this time but was encouraged to call back if questions arise. I have sent the order to [facility] and have received confirmation that they have received the order.

## 2021-05-26 NOTE — Progress Notes (Signed)
Remote ICD transmission.   

## 2021-05-31 ENCOUNTER — Telehealth (HOSPITAL_COMMUNITY): Payer: Self-pay | Admitting: Licensed Clinical Social Worker

## 2021-05-31 ENCOUNTER — Ambulatory Visit (HOSPITAL_COMMUNITY)
Admission: RE | Admit: 2021-05-31 | Discharge: 2021-05-31 | Disposition: A | Discharge status: Home / Self Care | Payer: Medicaid Other | Source: Ambulatory Visit | Attending: Cardiology | Admitting: Cardiology

## 2021-05-31 DIAGNOSIS — I5022 Chronic systolic (congestive) heart failure: Secondary | ICD-10-CM | POA: Insufficient documentation

## 2021-05-31 LAB — BASIC METABOLIC PANEL
Anion gap: 7 (ref 5–15)
BUN: 10 mg/dL (ref 6–20)
CO2: 28 mmol/L (ref 22–32)
Calcium: 9.2 mg/dL (ref 8.9–10.3)
Chloride: 96 mmol/L — ABNORMAL LOW (ref 98–111)
Creatinine, Ser: 0.82 mg/dL (ref 0.61–1.24)
GFR, Estimated: >60 mL/min (ref >60)
Glucose, Bld: 506 mg/dL (ref 70–99)
Potassium: 4.4 mmol/L (ref 3.5–5.1)
Sodium: 131 mmol/L — ABNORMAL LOW (ref 135–145)

## 2021-05-31 NOTE — Telephone Encounter (Signed)
H&V Care Navigation CSW Progress Note  Clinical Social Worker met with patient in the clinic. Patient stopped by the clinic to provide needed documents for assistance from the Patient [place] for monthly rent.  Patient provided income statement and rent receipt although landlord requested a email from [place] prior to completing [place] sent email with request to complete the W9.  SDOH Screenings   Alcohol Screen: Not on file  Depression (PHQ2-9): Not on file  Financial Resource Strain: High Risk   Difficulty of Paying Living Expenses: Very hard  Food Insecurity: Food Insecurity Present   Worried About Running Out of Food in the Last Year: Sometimes true   Ran Out of Food in the Last Year: Sometimes true  Housing: High Risk   Last Housing Risk Score: 2  Physical Activity: Not on file  Social Connections: Not on file  Stress: Not on file  Tobacco Use: Medium Risk   Smoking Tobacco Use: Former   Smokeless Tobacco Use: Former   Passive Exposure: Not on Pensions consultant Needs: No Transportation Needs   Lack of Transportation (Medical): No   Lack of Transportation (Non-Medical): No    CSW will await return email (W9) from landlord and process check request from the Patient [place]. [name], [place], [place]

## 2021-08-19 ENCOUNTER — Ambulatory Visit (INDEPENDENT_AMBULATORY_CARE_PROVIDER_SITE_OTHER): Payer: Self-pay

## 2021-08-19 DIAGNOSIS — I255 Ischemic cardiomyopathy: Secondary | ICD-10-CM

## 2021-08-19 LAB — CUP PACEART REMOTE DEVICE CHECK
Battery Remaining Longevity: 174 mo
Battery Remaining Percentage: 100 %
Brady Statistic RV Percent Paced: 0 %
Date Time Interrogation Session: 20230818014100
HighPow Impedance: 94 Ohm
Implantable Lead Implant Date: 20230215
Implantable Lead Location: 753860
Implantable Lead Model: 138
Implantable Lead Serial Number: 302859
Implantable Pulse Generator Implant Date: 20230215
Lead Channel Impedance Value: 532 Ohm
Lead Channel Pacing Threshold Amplitude: 0.7 V
Lead Channel Pacing Threshold Pulse Width: 0.4 ms
Lead Channel Setting Pacing Amplitude: 2.5 V
Lead Channel Setting Pacing Pulse Width: 0.4 ms
Lead Channel Setting Sensing Sensitivity: 0.5 mV
Pulse Gen Serial Number: 215773

## 2021-09-01 ENCOUNTER — Telehealth: Payer: Self-pay | Admitting: Cardiovascular Disease

## 2021-09-01 NOTE — Telephone Encounter (Signed)
Spoke to [employer] at [facility], faxed paper work over per request in regard to device clearance.   Fax #: [phone]

## 2021-09-01 NOTE — Telephone Encounter (Signed)
Calling with questions bout patient device. Please advise

## 2021-09-14 NOTE — Progress Notes (Signed)
Remote ICD transmission.   

## 2021-11-02 ENCOUNTER — Telehealth: Payer: Self-pay | Admitting: Internal Medicine

## 2021-11-02 DIAGNOSIS — Z0279 Encounter for issue of other medical certificate: Secondary | ICD-10-CM

## 2021-11-02 NOTE — Telephone Encounter (Signed)
[employer] disability form and payment received. Form in Dr. Olin Pia box.

## 2021-11-04 ENCOUNTER — Telehealth (HOSPITAL_COMMUNITY): Payer: Self-pay

## 2021-11-04 NOTE — Telephone Encounter (Addendum)
Paperwork faxed on 11/04/21 to [employer]. Patient called and informed

## 2021-11-18 ENCOUNTER — Ambulatory Visit (INDEPENDENT_AMBULATORY_CARE_PROVIDER_SITE_OTHER): Payer: Self-pay

## 2021-11-18 DIAGNOSIS — I255 Ischemic cardiomyopathy: Secondary | ICD-10-CM

## 2021-11-18 LAB — CUP PACEART REMOTE DEVICE CHECK
Battery Remaining Longevity: 180 mo
Battery Remaining Percentage: 100 %
Brady Statistic RV Percent Paced: 0 %
Date Time Interrogation Session: 20231117023300
HighPow Impedance: 88 Ohm
Implantable Lead Connection Status: 753985
Implantable Lead Implant Date: 20230215
Implantable Lead Location: 753860
Implantable Lead Model: 138
Implantable Lead Serial Number: 302859
Implantable Pulse Generator Implant Date: 20230215
Lead Channel Impedance Value: 512 Ohm
Lead Channel Pacing Threshold Amplitude: 0.7 V
Lead Channel Pacing Threshold Pulse Width: 0.4 ms
Lead Channel Setting Pacing Amplitude: 2.5 V
Lead Channel Setting Pacing Pulse Width: 0.4 ms
Lead Channel Setting Sensing Sensitivity: 0.5 mV
Pulse Gen Serial Number: 215773

## 2021-12-01 ENCOUNTER — Telehealth (HOSPITAL_COMMUNITY): Payer: Self-pay

## 2021-12-01 NOTE — Telephone Encounter (Signed)
Phoned [employer] to query why they had sent they same paper work to [place] since October 24th. Informed the agent that we had faxed the paper work on 11/04/21. Spoke with [place] and she stated that she has received all required documents.

## 2021-12-06 NOTE — Progress Notes (Signed)
Remote ICD transmission.   

## 2021-12-13 NOTE — Telephone Encounter (Signed)
Spoke with Tommy Liu and apologized for paperwork delay as Dr Graciela Husbands was out of the office for 3 weeks.  Tommy Liu advised per Dr Graciela Husbands previous disability papers were completed by Dr Shirlee Latch and these will also need to be completed by Dr Shirlee Latch as well.  Tommy Liu states paper work is for assistance with his truck payment and that he has been approved for permanent social security disability.  Tommy Liu advised will forward to Dr Alford Highland office for completion.  Tommy Liu verbalizes understanding and agrees with current plan. Paper work taken to the front and given to [place] for further assistance.

## 2022-01-17 ENCOUNTER — Telehealth (HOSPITAL_COMMUNITY): Payer: Self-pay

## 2022-01-17 NOTE — Telephone Encounter (Signed)
Forms faxed to [employer] on 01/16/22. Left message for patient to inform him that forms were faxed

## 2022-02-09 ENCOUNTER — Telehealth: Payer: Self-pay | Admitting: Allergy

## 2022-02-09 NOTE — Telephone Encounter (Signed)
Patient's current authorization ([id number]) is set to expire 03-08-2022.   Patient was told to follow up as needed at last visit.   Left message for patient advising him of expiration and inquired on whether he would like to be seen by our office again and to call office back to discuss.

## 2022-02-28 ENCOUNTER — Ambulatory Visit: Payer: No Typology Code available for payment source

## 2022-02-28 DIAGNOSIS — I255 Ischemic cardiomyopathy: Secondary | ICD-10-CM | POA: Diagnosis not present

## 2022-03-01 LAB — CUP PACEART REMOTE DEVICE CHECK
Battery Remaining Longevity: 156 mo
Battery Remaining Percentage: 100 %
Brady Statistic RV Percent Paced: 0 %
Date Time Interrogation Session: 20240227204800
HighPow Impedance: 73 Ohm
Implantable Lead Connection Status: 753985
Implantable Lead Implant Date: 20230215
Implantable Lead Location: 753860
Implantable Lead Model: 138
Implantable Lead Serial Number: 302859
Implantable Pulse Generator Implant Date: 20230215
Lead Channel Impedance Value: 501 Ohm
Lead Channel Pacing Threshold Amplitude: 0.9 V
Lead Channel Pacing Threshold Pulse Width: 0.4 ms
Lead Channel Setting Pacing Amplitude: 2.5 V
Lead Channel Setting Pacing Pulse Width: 0.4 ms
Lead Channel Setting Sensing Sensitivity: 0.5 mV
Pulse Gen Serial Number: 215773

## 2022-04-04 ENCOUNTER — Telehealth: Payer: Self-pay | Admitting: Internal Medicine

## 2022-04-04 NOTE — Telephone Encounter (Signed)
  Iona Beard with [employer] calling, they would like to verify if Dr. Caryl Comes ordered a return to work date for the pt. He said, to call them back and provide claim number [id number]

## 2022-04-05 NOTE — Progress Notes (Signed)
Remote ICD transmission.   

## 2022-04-07 ENCOUNTER — Telehealth (HOSPITAL_COMMUNITY): Payer: Self-pay

## 2022-04-07 NOTE — Telephone Encounter (Signed)
Forms faxed to [employer] on 04/07/22 at [phone]. Patient called and made aware

## 2022-04-07 NOTE — Telephone Encounter (Signed)
Called patient to get his follow up appointment scheduled

## 2022-04-12 ENCOUNTER — Telehealth: Payer: Self-pay | Admitting: Internal Medicine

## 2022-04-12 NOTE — Telephone Encounter (Signed)
Received [employer] disability form today. Called patient and left message to come here to sign R.O.I. and pay the $29 form fee.

## 2022-05-26 ENCOUNTER — Ambulatory Visit (HOSPITAL_COMMUNITY)
Admission: RE | Admit: 2022-05-26 | Discharge: 2022-05-26 | Disposition: A | Discharge status: Home / Self Care | Payer: Medicaid Other | Source: Ambulatory Visit | Attending: Cardiology | Admitting: Cardiology

## 2022-05-26 ENCOUNTER — Encounter (HOSPITAL_COMMUNITY): Payer: Self-pay | Admitting: Cardiology

## 2022-05-26 VITALS — BP 124/80 | HR 66 | Wt 282.8 lb

## 2022-05-26 DIAGNOSIS — Z7984 Long term (current) use of oral hypoglycemic drugs: Secondary | ICD-10-CM | POA: Diagnosis not present

## 2022-05-26 DIAGNOSIS — Z955 Presence of coronary angioplasty implant and graft: Secondary | ICD-10-CM | POA: Diagnosis not present

## 2022-05-26 DIAGNOSIS — Z7902 Long term (current) use of antithrombotics/antiplatelets: Secondary | ICD-10-CM | POA: Insufficient documentation

## 2022-05-26 DIAGNOSIS — I252 Old myocardial infarction: Secondary | ICD-10-CM | POA: Diagnosis not present

## 2022-05-26 DIAGNOSIS — Z87891 Personal history of nicotine dependence: Secondary | ICD-10-CM | POA: Diagnosis not present

## 2022-05-26 DIAGNOSIS — I2510 Atherosclerotic heart disease of native coronary artery without angina pectoris: Secondary | ICD-10-CM | POA: Insufficient documentation

## 2022-05-26 DIAGNOSIS — E119 Type 2 diabetes mellitus without complications: Secondary | ICD-10-CM | POA: Diagnosis not present

## 2022-05-26 DIAGNOSIS — Z79899 Other long term (current) drug therapy: Secondary | ICD-10-CM | POA: Insufficient documentation

## 2022-05-26 DIAGNOSIS — I110 Hypertensive heart disease with heart failure: Secondary | ICD-10-CM | POA: Diagnosis not present

## 2022-05-26 DIAGNOSIS — I255 Ischemic cardiomyopathy: Secondary | ICD-10-CM | POA: Insufficient documentation

## 2022-05-26 DIAGNOSIS — F32A Depression, unspecified: Secondary | ICD-10-CM | POA: Insufficient documentation

## 2022-05-26 DIAGNOSIS — Z5986 Financial insecurity: Secondary | ICD-10-CM | POA: Insufficient documentation

## 2022-05-26 DIAGNOSIS — Z8249 Family history of ischemic heart disease and other diseases of the circulatory system: Secondary | ICD-10-CM | POA: Diagnosis not present

## 2022-05-26 DIAGNOSIS — G4733 Obstructive sleep apnea (adult) (pediatric): Secondary | ICD-10-CM | POA: Insufficient documentation

## 2022-05-26 DIAGNOSIS — I5022 Chronic systolic (congestive) heart failure: Secondary | ICD-10-CM | POA: Insufficient documentation

## 2022-05-26 LAB — BASIC METABOLIC PANEL
Anion gap: 12 (ref 5–15)
BUN: 10 mg/dL (ref 6–20)
CO2: 23 mmol/L (ref 22–32)
Calcium: 9 mg/dL (ref 8.9–10.3)
Chloride: 98 mmol/L (ref 98–111)
Creatinine, Ser: 0.66 mg/dL (ref 0.61–1.24)
GFR, Estimated: >60 mL/min (ref >60)
Glucose, Bld: 324 mg/dL — ABNORMAL HIGH (ref 70–99)
Potassium: 4 mmol/L (ref 3.5–5.1)
Sodium: 133 mmol/L — ABNORMAL LOW (ref 135–145)

## 2022-05-26 LAB — BRAIN NATRIURETIC PEPTIDE: B Natriuretic Peptide: 58.9 pg/mL (ref 0.0–100.0)

## 2022-05-26 LAB — LIPID PANEL
Cholesterol: 254 mg/dL — ABNORMAL HIGH (ref 0–200)
HDL: 39 mg/dL — ABNORMAL LOW (ref >40)
LDL Cholesterol: 160 mg/dL — ABNORMAL HIGH (ref 0–99)
Total CHOL/HDL Ratio: 6.5 RATIO
Triglycerides: 276 mg/dL — ABNORMAL HIGH (ref <150)
VLDL: 55 mg/dL — ABNORMAL HIGH (ref 0–40)

## 2022-05-26 MED ORDER — FUROSEMIDE 20 MG PO TABS
40.0000 mg | ORAL_TABLET | Freq: Every day | ORAL | 3 refills | Status: DC
Start: 2022-05-26 — End: 2022-08-17

## 2022-05-26 MED ORDER — ENTRESTO 97-103 MG PO TABS: 1.0000 | ORAL_TABLET | Freq: Two times a day (BID) | ORAL | 3 refills | Status: AC

## 2022-05-26 MED ORDER — POTASSIUM CHLORIDE CRYS ER 20 MEQ PO TBCR: 20.0000 meq | EXTENDED_RELEASE_TABLET | Freq: Two times a day (BID) | ORAL | 3 refills | Status: DC

## 2022-05-26 MED ORDER — CLOPIDOGREL BISULFATE 75 MG PO TABS: 75.0000 mg | ORAL_TABLET | Freq: Every day | ORAL | 3 refills | Status: DC

## 2022-05-26 MED ORDER — SPIRONOLACTONE 25 MG PO TABS: 25.0000 mg | ORAL_TABLET | Freq: Every day | ORAL | 3 refills | Status: DC

## 2022-05-26 MED ORDER — ROSUVASTATIN CALCIUM 40 MG PO TABS: 40.0000 mg | ORAL_TABLET | Freq: Every day | ORAL | 3 refills | Status: AC

## 2022-05-26 MED ORDER — CARVEDILOL 12.5 MG PO TABS: 12.5000 mg | ORAL_TABLET | Freq: Two times a day (BID) | ORAL | 3 refills | Status: DC

## 2022-05-26 MED ORDER — EMPAGLIFLOZIN 25 MG PO TABS: ORAL_TABLET | ORAL | 3 refills | Status: DC

## 2022-05-26 MED ORDER — EZETIMIBE 10 MG PO TABS: 10.0000 mg | ORAL_TABLET | Freq: Every day | ORAL | 3 refills | Status: AC

## 2022-05-26 NOTE — Patient Instructions (Signed)
STOP Asprin,  STOP Brilinta,  INCREASE Carvedilol to 12.5 mg Twice daily  Labs done today, your results will be available in MyChart, we will contact you for abnormal readings.  Your physician has requested that you have an echocardiogram. Echocardiography is a painless test that uses sound waves to create images of your heart. It provides your doctor with information about the size and shape of your heart and how well your heart's chambers and valves are working. This procedure takes approximately one hour. There are no restrictions for this procedure. Please do NOT wear cologne, perfume, aftershave, or lotions (deodorant is allowed). Please arrive 15 minutes prior to your appointment time.  Your physician recommends that you schedule a follow-up appointment in: 3 months with an echocardiogram   If you have any questions or concerns before your next appointment please send [place] a message through [place] or call our office at [phone].    TO LEAVE A MESSAGE FOR THE NURSE SELECT OPTION 2, PLEASE LEAVE A MESSAGE INCLUDING: YOUR NAME DATE OF BIRTH CALL BACK NUMBER REASON FOR CALL**this is important as we prioritize the call backs  YOU WILL RECEIVE A CALL BACK THE SAME DAY AS LONG AS YOU CALL BEFORE 4:00 PM  At the [facility], you and your health needs are our priority. As part of our continuing mission to provide you with exceptional heart care, we have created designated Provider Care Teams. These Care Teams include your primary Cardiologist (physician) and Advanced Practice Providers (APPs- Physician Assistants and Nurse Practitioners) who all work together to provide you with the care you need, when you need it.   You may see any of the following providers on your designated Care Team at your next follow up: Dr Arvilla Meres Dr Marca Ancona Dr. Marcos Eke, NP Robbie Lis, [place] [facility] [place], [place] Brynda Peon, NP Karle Plumber, PharmD   Please be sure to bring in all your medications bottles to every appointment.    Thank you for choosing [facility]

## 2022-05-26 NOTE — Telephone Encounter (Signed)
I was not able to get in touch with this patient so I sent him the disability forms.  I sent a note with the form letting him know that if he want [place] to complete the form, he will need to bring it to [place] along with the $29 forms fee.  I mentioned that we do not take a credit or debit card.

## 2022-05-27 NOTE — Progress Notes (Signed)
Advanced [facility] Failure Clinic Note    PCP: Wendi Maya, MD PCP-Cardiologist: Kristeen Miss, MD  HF Cardiologist: Dr. Lillia Pauls [place] is a 57 y.o. with history of CAD, HTN, type 2 diabetes, OSA but not on CPAP and ischemic CMP presents for followup of CAD and CHF.  Patient was admitted in 5/19 with unstable angina.  Cath showed 90% proximal stenosis of nondominant RCA and 40% stenosis proximal LAD.  Echo showed EF 60-65%.  He was managed medically.  He was lost to followup as an outpatient until 9/22.    Transferred from [place] to [facility] on 09/19/20 with anterior STEMI and cardiogenic shock. He was hypotensive on arrival and started on norepinephrine. Coronary angiogram demonstrated 90% thrombotic stenosis in mid LAD treated with PCI/DES, suspected embolic disease causing occlusion in distal LAD, chronic OM1 occlusion with collaterals, 90% proximal non-dominant RCA. RHC showed mean RA 9, PA 26/11, mean PCWP 16 with LVEDP 23, CI 2.46. Echo 9/22 with LVEF of 25-30%, WMA consistent with large LAD territory infarct, no LV thrombus, RV okay, RVSP 30 mmHg, no MR.  Diuresed with IV lasix. Initiated on GDMT with Entresto, empagliflozin, and spironolactone. BP too soft for beta blocker. LifeVest placed prior to discharge; discharge weight 220 lbs.    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   Follow up with 2/23 stable NYHA II symptoms, carvedilol increased. Referred to CR at [facility].   S/p [employer] ICD implant 2/23.  Today he returns for HF follow up. Using CPAP daily.  Weight down 23 lbs. He is out of several meds, says only for a few days.  No dyspnea or chest pain with exertion.  No problems walking up stairs.  No lightheadedness.  Walks his dog for exercise.    ECG (personally reviewed): NSR, old ASMI, LAFB, inferior Qs  [employer] device interrogation: HeartLogic 0, no VT.   Labs (9/22): LDL 105 Labs (11/22): K 3.8,  creatinine 0.64 Labs (1/23): K 4.4, creatinine 0.66 Labs (4/23): K 4.3, creatinine 0.67 Labs (5/23): K 4.4, creatinine 0.82  PMH: 1. HTN 2. Type 2 diabetes 3. OSA: Using CPAP 4. CAD: Unstable angina in 5/19, cath with 90% proximal nondominant small RCA.  - 9/22 anterior STEMI: 90% thrombotic mid LAD stenosis treated with DES, total occlusion of distal LAD likely by embolic event, totally occluded OM1 with collaterals, 90% proximal stenosis nondominant RCA.  5. Chronic systolic CHF: Ischemic cardiomyopathy.  [employer] ICD.  - Echo (9/22): EF 25-30%, LAD territory WMAs, RV ok.  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   Current Outpatient Medications  Medication Sig Dispense Refill   acetaminophen (TYLENOL) 325 MG tablet Take 650 mg by mouth every 6 (six) hours as needed for moderate pain.     clopidogrel (PLAVIX) 75 MG tablet Take 1 tablet (75 mg total) by mouth daily. 90 tablet 3   glipiZIDE (GLUCOTROL) 10 MG tablet Take 10 mg by mouth 2 (two) times daily before a meal.      metFORMIN (GLUCOPHAGE) 500 MG tablet Take 1,000 mg by mouth 2 (two) times daily with a meal.      nitroGLYCERIN (NITROSTAT) 0.4 MG SL tablet Place 1 tablet (0.4 mg total) under the tongue every 5 (five) minutes x 3 doses as needed for chest pain. 25 tablet 0   Semaglutide,0.25 or 0.5MG /DOS, 2 MG/1.5ML SOPN Inject 0.5 mg as directed every Monday.     tamsulosin (  FLOMAX) 0.4 MG CAPS capsule Take 0.4 mg by mouth daily.     carvedilol (COREG) 12.5 MG tablet Take 1 tablet (12.5 mg total) by mouth 2 (two) times daily. 180 tablet 3   empagliflozin (JARDIANCE) 25 MG TABS tablet Take 12.5 mg (1/2 tablet) daily 45 tablet 3   ezetimibe (ZETIA) 10 MG tablet Take 1 tablet (10 mg total) by mouth daily. 90 tablet 3   furosemide (LASIX) 20 MG tablet Take 2 tablets (40 mg total) by mouth daily. 180 tablet 3   potassium chloride SA (KLOR-CON M) 20 MEQ tablet Take 1 tablet (20 mEq total)  by mouth 2 (two) times daily. 180 tablet 3   rosuvastatin (CRESTOR) 40 MG tablet Take 1 tablet (40 mg total) by mouth daily. 90 tablet 3   sacubitril-valsartan (ENTRESTO) 97-103 MG Take 1 tablet by mouth 2 (two) times daily. 180 tablet 3   spironolactone (ALDACTONE) 25 MG tablet Take 1 tablet (25 mg total) by mouth at bedtime. 90 tablet 3   No current facility-administered medications for this encounter.   No Known Allergies  Social History   Socioeconomic History   Marital status: Divorced    Spouse name: Not on file   Number of children: Not on file   Years of education: Not on file   Highest education level: Not on file  Occupational History   Not on file  Tobacco Use   Smoking status: Former    Packs/day: 1.00    Years: 30.00    Additional pack years: 0.00    Total pack years: 30.00    Types: Cigarettes    Quit date: 01/02/1997    Years since quitting: 25.4   Smokeless tobacco: Former  Building services engineer Use: Never used  Substance and Sexual Activity   Alcohol use: Yes    Alcohol/week: 2.0 standard drinks of alcohol    Types: 2 Cans of beer per week    Comment: occasional   Drug use: Never   Sexual activity: Not on file  Other Topics Concern   Not on file  Social History Narrative   Lives alone.  Still has support of ex-wife and other relatives   Social Determinants of Health   Financial Resource Strain: High Risk (05/20/2021)   Overall Financial Resource Strain (CARDIA)    Difficulty of Paying Living Expenses: Very hard  Food Insecurity: Food Insecurity Present (05/20/2021)   Hunger Vital Sign    Worried About Running Out of Food in the Last Year: Sometimes true    Ran Out of Food in the Last Year: Sometimes true  Transportation Needs: No Transportation Needs (05/20/2021)   PRAPARE - Administrator, Civil Service (Medical): No    Lack of Transportation (Non-Medical): No  Physical Activity: Not on file  Stress: Not on file  Social Connections: Not  on file  Intimate Partner Violence: Not on file   Family History  Problem Relation Age of Onset   Heart attack Mother    Heart attack Father    Hypertension Father    BP 124/80   Pulse 66   Wt 128.3 kg (282 lb 12.8 oz)   SpO2 97%   BMI 36.31 kg/m   Wt Readings from Last 3 Encounters:  05/26/22 128.3 kg (282 lb 12.8 oz)  05/24/21 134.3 kg (296 lb)  05/20/21 (!) 138.6 kg (305 lb 9.6 oz)   PHYSICAL EXAM: General: NAD Neck: No JVD, no thyromegaly or thyroid nodule.  Lungs: Clear  to auscultation bilaterally with normal respiratory effort. CV: Nondisplaced PMI.  Heart regular S1/S2, no S3/S4, no murmur.  No peripheral edema.  No carotid bruit.  Normal pedal pulses.  Abdomen: Soft, nontender, no hepatosplenomegaly, no distention.  Skin: Intact without lesions or rashes.  Neurologic: Alert and oriented x 3.  Psych: Normal affect. Extremities: No clubbing or cyanosis.  HEENT: Normal.   ASSESSMENT & PLAN: 1. CAD: History of unstable angina 5/19, medically managed (90% stenosis nondominant RCA).  Admitted with acute anterior MI in 9/22.  Cath with 90% thrombotic stenosis mid LAD, suspected embolic occlusion distal LAD, CTO OM1 with collaterals, 90% nondominant RCA.  Patient treated with DES to mid LAD.  No chest pain.  - He can stop ASA and Brilinta at this point (PCI in 9/22) and can start on clopidogrel 75 mg daily long-term.  - Crestor 40 mg daily + Zetia.  Check lipids today.   2. Chronic systolic CHF: Ischemic cardiomyopathy.  Echo in 9/22 showed EF 25-30%, LAD wall motion abnormalities, no LV thrombus, normal RV. Echo in 12/22 showed EF 30% with apical aneurysm but no thrombus.  [employer] ICD.  Weight down today, not volume overloaded on exam or by HeartLogic, NYHA class I-II.  - Refill meds he is missing.  - Continue Entresto 97/103 mg bid. BMET/BNP today.  - Continue Lasix 40 mg daily. - Continue empagliflozin 12.5 mg daily.  - Continue spironolactone 25 daily.  -  increase Coreg to 12.5 mg bid.  - Repeat echo at followup in 3 months.  3. Diabetes: Followed at [facility]. 4. OSA: Continue CPAP.  5. Depression: Per PCP.   Followup 3 months with echo.   Marca Ancona, MD 05/27/22

## 2022-05-30 ENCOUNTER — Ambulatory Visit (INDEPENDENT_AMBULATORY_CARE_PROVIDER_SITE_OTHER): Payer: Medicaid Other

## 2022-05-30 DIAGNOSIS — I255 Ischemic cardiomyopathy: Secondary | ICD-10-CM

## 2022-05-30 DIAGNOSIS — I5022 Chronic systolic (congestive) heart failure: Secondary | ICD-10-CM | POA: Diagnosis not present

## 2022-05-31 LAB — CUP PACEART REMOTE DEVICE CHECK
Battery Remaining Longevity: 162 mo
Battery Remaining Percentage: 100 %
Brady Statistic RV Percent Paced: 0 %
Date Time Interrogation Session: 20240528014200
HighPow Impedance: 78 Ohm
Implantable Lead Connection Status: 753985
Implantable Lead Implant Date: 20230215
Implantable Lead Location: 753860
Implantable Lead Model: 138
Implantable Lead Serial Number: 302859
Implantable Pulse Generator Implant Date: 20230215
Lead Channel Impedance Value: 522 Ohm
Lead Channel Pacing Threshold Amplitude: 0.8 V
Lead Channel Pacing Threshold Pulse Width: 0.4 ms
Lead Channel Setting Pacing Amplitude: 2.5 V
Lead Channel Setting Pacing Pulse Width: 0.4 ms
Lead Channel Setting Sensing Sensitivity: 0.5 mV
Pulse Gen Serial Number: 215773

## 2022-06-01 ENCOUNTER — Encounter (HOSPITAL_COMMUNITY): Payer: Self-pay

## 2022-06-09 ENCOUNTER — Telehealth (HOSPITAL_COMMUNITY): Payer: Self-pay | Admitting: Cardiology

## 2022-06-09 NOTE — Telephone Encounter (Signed)
-  pt returned call regarding labs results Reports he will take meds daily Does not qualify for [facility]

## 2022-06-12 ENCOUNTER — Telehealth (HOSPITAL_COMMUNITY): Payer: Self-pay

## 2022-06-12 NOTE — Telephone Encounter (Signed)
Disability paper work faxed to [employer], left message for patient

## 2022-06-23 NOTE — Progress Notes (Signed)
Remote ICD transmission.   

## 2022-08-17 ENCOUNTER — Encounter (HOSPITAL_COMMUNITY): Payer: Self-pay | Admitting: Cardiology

## 2022-08-17 ENCOUNTER — Ambulatory Visit (HOSPITAL_COMMUNITY)
Admission: RE | Admit: 2022-08-17 | Discharge: 2022-08-17 | Disposition: A | Discharge status: Home / Self Care | Payer: Medicaid Other | Source: Ambulatory Visit | Attending: Cardiology | Admitting: Cardiology

## 2022-08-17 ENCOUNTER — Other Ambulatory Visit (HOSPITAL_COMMUNITY): Payer: Self-pay

## 2022-08-17 VITALS — BP 130/80 | HR 57 | Wt 277.4 lb

## 2022-08-17 DIAGNOSIS — I2510 Atherosclerotic heart disease of native coronary artery without angina pectoris: Secondary | ICD-10-CM | POA: Insufficient documentation

## 2022-08-17 DIAGNOSIS — E119 Type 2 diabetes mellitus without complications: Secondary | ICD-10-CM | POA: Diagnosis not present

## 2022-08-17 DIAGNOSIS — G4733 Obstructive sleep apnea (adult) (pediatric): Secondary | ICD-10-CM | POA: Diagnosis not present

## 2022-08-17 DIAGNOSIS — I509 Heart failure, unspecified: Secondary | ICD-10-CM | POA: Diagnosis present

## 2022-08-17 DIAGNOSIS — I110 Hypertensive heart disease with heart failure: Secondary | ICD-10-CM | POA: Insufficient documentation

## 2022-08-17 DIAGNOSIS — I5022 Chronic systolic (congestive) heart failure: Secondary | ICD-10-CM

## 2022-08-17 DIAGNOSIS — E1169 Type 2 diabetes mellitus with other specified complication: Secondary | ICD-10-CM | POA: Diagnosis not present

## 2022-08-17 DIAGNOSIS — Z7902 Long term (current) use of antithrombotics/antiplatelets: Secondary | ICD-10-CM | POA: Insufficient documentation

## 2022-08-17 DIAGNOSIS — E785 Hyperlipidemia, unspecified: Secondary | ICD-10-CM

## 2022-08-17 DIAGNOSIS — F32A Depression, unspecified: Secondary | ICD-10-CM | POA: Insufficient documentation

## 2022-08-17 LAB — BASIC METABOLIC PANEL
Anion gap: 11 (ref 5–15)
BUN: 11 mg/dL (ref 6–20)
CO2: 26 mmol/L (ref 22–32)
Calcium: 9 mg/dL (ref 8.9–10.3)
Chloride: 99 mmol/L (ref 98–111)
Creatinine, Ser: 0.74 mg/dL (ref 0.61–1.24)
GFR, Estimated: >60 mL/min (ref >60)
Glucose, Bld: 273 mg/dL — ABNORMAL HIGH (ref 70–99)
Potassium: 4.2 mmol/L (ref 3.5–5.1)
Sodium: 136 mmol/L (ref 135–145)

## 2022-08-17 LAB — ECHOCARDIOGRAM COMPLETE
AR max vel: 3.29 cm2
AV Area VTI: 3.08 cm2
AV Area mean vel: 3.34 cm2
AV Mean grad: 4 mmHg
AV Peak grad: 6.7 mmHg
Ao pk vel: 1.29 m/s
Area-P 1/2: 3.24 cm2
P 1/2 time: 569 msec
S' Lateral: 4.4 cm

## 2022-08-17 LAB — BRAIN NATRIURETIC PEPTIDE: B Natriuretic Peptide: 141.2 pg/mL — ABNORMAL HIGH (ref 0.0–100.0)

## 2022-08-17 MED ORDER — PERFLUTREN LIPID MICROSPHERE
1.0000 mL | INTRAVENOUS | Status: DC | PRN
  Administered 2022-08-17: 2 mL via INTRAVENOUS
  Filled 2022-08-17: qty 10

## 2022-08-17 MED ORDER — CARVEDILOL 12.5 MG PO TABS: 18.7500 mg | ORAL_TABLET | Freq: Two times a day (BID) | ORAL | 3 refills | Status: DC

## 2022-08-17 MED ORDER — EMPAGLIFLOZIN 25 MG PO TABS: ORAL_TABLET | ORAL | 3 refills | Status: AC

## 2022-08-17 MED ORDER — POTASSIUM CHLORIDE CRYS ER 20 MEQ PO TBCR: 20.0000 meq | EXTENDED_RELEASE_TABLET | Freq: Two times a day (BID) | ORAL | 3 refills | Status: DC

## 2022-08-17 MED ORDER — CLOPIDOGREL BISULFATE 75 MG PO TABS: 75.0000 mg | ORAL_TABLET | Freq: Every day | ORAL | 3 refills | Status: DC

## 2022-08-17 MED ORDER — SPIRONOLACTONE 25 MG PO TABS: 25.0000 mg | ORAL_TABLET | Freq: Every day | ORAL | 3 refills | Status: DC

## 2022-08-17 MED ORDER — FUROSEMIDE 20 MG PO TABS
40.0000 mg | ORAL_TABLET | Freq: Every day | ORAL | 3 refills | Status: DC
Start: 2022-08-17 — End: 2023-08-29

## 2022-08-17 NOTE — Patient Instructions (Addendum)
INCREASE Carvedilol to 18.75 mg ( 1 1/2 Tab) Twice daily  Labs done today, your results will be available in MyChart, we will contact you for abnormal readings.  You have been referred to the lipid clinic. THEY WILL CALL YOU TO ARRANGE YOUR APPOINTMENT.  Your physician recommends that you schedule a follow-up appointment in: 3 months.  If you have any questions or concerns before your next appointment please send [place] a message through [place] or call our office at [phone].    TO LEAVE A MESSAGE FOR THE NURSE SELECT OPTION 2, PLEASE LEAVE A MESSAGE INCLUDING: YOUR NAME DATE OF BIRTH CALL BACK NUMBER REASON FOR CALL**this is important as we prioritize the call backs  YOU WILL RECEIVE A CALL BACK THE SAME DAY AS LONG AS YOU CALL BEFORE 4:00 PM  At the [facility], you and your health needs are our priority. As part of our continuing mission to provide you with exceptional heart care, we have created designated Provider Care Teams. These Care Teams include your primary Cardiologist (physician) and Advanced Practice Providers (APPs- Physician Assistants and Nurse Practitioners) who all work together to provide you with the care you need, when you need it.   You may see any of the following providers on your designated Care Team at your next follow up: Dr Arvilla Meres Dr Marca Ancona Dr. Marcos Eke, NP Robbie Lis, [place] [facility] [place], [place] Brynda Peon, NP Karle Plumber, PharmD   Please be sure to bring in all your medications bottles to every appointment.    Thank you for choosing [facility]

## 2022-08-17 NOTE — Progress Notes (Signed)
Advanced Heart Failure Clinic Note    PCP: Wendi Maya, MD PCP-Cardiologist: Kristeen Miss, MD  HF Cardiologist: Dr. Lillia Pauls Tommy Liu is a 57 y.o. with history of CAD, HTN, type 2 diabetes, OSA but not on CPAP and ischemic CMP presents for followup of CAD and CHF.  Patient was admitted in 5/19 with unstable angina.  Cath showed 90% proximal stenosis of nondominant RCA and 40% stenosis proximal LAD.  Echo showed EF 60-65%.  He was managed medically.  He was lost to followup as an outpatient until 9/22.    Transferred from [place] to [facility] on 09/19/20 with anterior STEMI and cardiogenic shock. He was hypotensive on arrival and started on norepinephrine. Coronary angiogram demonstrated 90% thrombotic stenosis in mid LAD treated with PCI/DES, suspected embolic disease causing occlusion in distal LAD, chronic OM1 occlusion with collaterals, 90% proximal non-dominant RCA. RHC showed mean RA 9, PA 26/11, mean PCWP 16 with LVEDP 23, CI 2.46. Echo 9/22 with LVEF of 25-30%, WMA consistent with large LAD territory infarct, no LV thrombus, RV okay, RVSP 30 mmHg, no MR.  Diuresed with IV lasix. Initiated on GDMT with Entresto, empagliflozin, and spironolactone. BP too soft for beta blocker. LifeVest placed prior to discharge; discharge weight 220 lbs.    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   Follow up with 2/23 stable NYHA II symptoms, carvedilol increased. Referred to CR at [facility].   S/p [employer] ICD implant 2/23.  Echo was done today and reviewed, EF 35-40% with wall motion abnormalities, mild LVH, grade 2 diastolic dysfunction, normal RV, IVC normal.   Today he returns for HF follow up. Using CPAP daily.  Weight down 5 lbs.  Using pillbox.  No dyspnea walking on flat ground, walks dog regularly.  No lightheadedness.  No chest pain.  No orthopnea/PND.    ECG (personally reviewed): NSR, LAFB, inferior and anterior Qs  [employer] device interrogation: HeartLogic 0, no VT.   Labs (9/22): LDL 105 Labs (11/22): K 3.8, creatinine 0.64 Labs (1/23): K 4.4, creatinine 0.66 Labs (4/23): K 4.3, creatinine 0.67 Labs (5/23): K 4.4, creatinine 0.82 Labs (5/24): LDL 160, BNP 59, K 4, creatinine 0.66  PMH: 1. HTN 2. Type 2 diabetes 3. OSA: Using CPAP 4. CAD: Unstable angina in 5/19, cath with 90% proximal nondominant small RCA.  - 9/22 anterior STEMI: 90% thrombotic mid LAD stenosis treated with DES, total occlusion of distal LAD likely by embolic event, totally occluded OM1 with collaterals, 90% proximal stenosis nondominant RCA.  5. Chronic systolic CHF: Ischemic cardiomyopathy.  [employer] ICD.  - Echo (9/22): EF 25-30%, LAD territory WMAs, RV ok.  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  - Echo (8/24): EF 35-40% with wall motion abnormalities, mild LVH, grade 2 diastolic dysfunction, normal RV, IVC normal.  Current Outpatient Medications  Medication Sig Dispense Refill   acetaminophen (TYLENOL) 325 MG tablet Take 650 mg by mouth every 6 (six) hours as needed for moderate pain.     ezetimibe (ZETIA) 10 MG tablet Take 1 tablet (10 mg total) by mouth daily. 90 tablet 3   glipiZIDE (GLUCOTROL) 10 MG tablet Take 10 mg by mouth 2 (two) times daily before a meal.      metFORMIN (GLUCOPHAGE) 500 MG tablet Take 1,000 mg by mouth 2 (two) times daily with a meal.      nitroGLYCERIN (NITROSTAT) 0.4 MG SL tablet Place 1 tablet (  0.4 mg total) under the tongue every 5 (five) minutes x 3 doses as needed for chest pain. 25 tablet 0   rosuvastatin (CRESTOR) 40 MG tablet Take 1 tablet (40 mg total) by mouth daily. 90 tablet 3   sacubitril-valsartan (ENTRESTO) 97-103 MG Take 1 tablet by mouth 2 (two) times daily. 180 tablet 3   Semaglutide,0.25 or 0.5MG /DOS, 2 MG/1.5ML SOPN Inject 0.5 mg as directed every Monday.     tamsulosin (FLOMAX) 0.4 MG CAPS capsule Take 0.4 mg by mouth daily.      carvedilol (COREG) 12.5 MG tablet Take 1.5 tablets (18.75 mg total) by mouth 2 (two) times daily. 180 tablet 3   clopidogrel (PLAVIX) 75 MG tablet Take 1 tablet (75 mg total) by mouth daily. 90 tablet 3   empagliflozin (JARDIANCE) 25 MG TABS tablet Take 12.5 mg (1/2 tablet) daily 45 tablet 3   furosemide (LASIX) 20 MG tablet Take 2 tablets (40 mg total) by mouth daily. 180 tablet 3   potassium chloride SA (KLOR-CON M) 20 MEQ tablet Take 1 tablet (20 mEq total) by mouth 2 (two) times daily. 180 tablet 3   spironolactone (ALDACTONE) 25 MG tablet Take 1 tablet (25 mg total) by mouth at bedtime. 90 tablet 3   No current facility-administered medications for this encounter.   No Known Allergies  Social History   Socioeconomic History   Marital status: Divorced    Spouse name: Not on file   Number of children: Not on file   Years of education: Not on file   Highest education level: Not on file  Occupational History   Not on file  Tobacco Use   Smoking status: Former    Current packs/day: 0.00    Average packs/day: 1 pack/day for 30.0 years (30.0 ttl pk-yrs)    Types: Cigarettes    Start date: 01/03/1967    Quit date: 01/02/1997    Years since quitting: 25.6   Smokeless tobacco: Former  Building services engineer status: Never Used  Substance and Sexual Activity   Alcohol use: Yes    Alcohol/week: 2.0 standard drinks of alcohol    Types: 2 Cans of beer per week    Comment: occasional   Drug use: Never   Sexual activity: Not on file  Other Topics Concern   Not on file  Social History Narrative   Lives alone.  Still has support of ex-wife and other relatives   Social Determinants of Health   Financial Resource Strain: High Risk (05/20/2021)   Overall Financial Resource Strain (CARDIA)    Difficulty of Paying Living Expenses: Very hard  Food Insecurity: Food Insecurity Present (05/20/2021)   Hunger Vital Sign    Worried About Running Out of Food in the Last Year: Sometimes true     Ran Out of Food in the Last Year: Sometimes true  Transportation Needs: No Transportation Needs (05/20/2021)   PRAPARE - Administrator, Civil Service (Medical): No    Lack of Transportation (Non-Medical): No  Physical Activity: Not on file  Stress: Not on file  Social Connections: Not on file  Intimate Partner Violence: Not on file   Family History  Problem Relation Age of Onset   Heart attack Mother    Heart attack Father    Hypertension Father    BP 130/80   Pulse (!) 57   Wt 125.8 kg (277 lb 6.4 oz)   SpO2 98%   BMI 35.62 kg/m   Wt Readings from  Last 3 Encounters:  08/17/22 125.8 kg (277 lb 6.4 oz)  05/26/22 128.3 kg (282 lb 12.8 oz)  05/24/21 134.3 kg (296 lb)   PHYSICAL EXAM: General: NAD Neck: Thick. No JVD, no thyromegaly or thyroid nodule.  Lungs: Clear to auscultation bilaterally with normal respiratory effort. CV: Nondisplaced PMI.  Heart regular S1/S2, no S3/S4, no murmur.  1+ edema to knee on left, 1+ ankle edema on right (chronic asymmetry).  No carotid bruit.  Normal pedal pulses.  Abdomen: Soft, nontender, no hepatosplenomegaly, no distention.  Skin: Intact without lesions or rashes.  Neurologic: Alert and oriented x 3.  Psych: Normal affect. Extremities: No clubbing or cyanosis.  HEENT: Normal.   ASSESSMENT & PLAN: 1. CAD:  Very strong FH of CAD. History of unstable angina 5/19, medically managed (90% stenosis nondominant RCA).  Admitted with acute anterior MI in 9/22. Cath with 90% thrombotic stenosis mid LAD, suspected embolic occlusion distal LAD, CTO OM1 with collaterals, 90% nondominant RCA.  Patient treated with DES to mid LAD.  No chest pain.  - Continue Plavix 75 mg daily.  - Crestor 40 mg daily + Zetia.  LDL was very high in 5/24, says he was taking Crestor and Zetia.  Will refer to lipid clinic for Repatha.  - Check Lp(a).  2. Chronic systolic CHF: Ischemic cardiomyopathy.  Echo in 9/22 showed EF 25-30%, LAD wall motion abnormalities,  no LV thrombus, normal RV. Echo in 12/22 showed EF 30% with apical aneurysm but no thrombus.  [employer] ICD.  Echo today showed EF 35-40% with wall motion abnormalities, mild LVH, grade 2 diastolic dysfunction, normal RV, IVC normal.  Weight down today, not volume overloaded on exam or by HeartLogic, NYHA class I-II (think peripheral edema is primarily venous insufficiency).   - Continue Entresto 97/103 mg bid. BMET/BNP today.  - Continue Lasix 40 mg daily. - Continue empagliflozin 12.5 mg daily.  - Continue spironolactone 25 daily.  - increase Coreg to 18.75 mg bid.  3. Diabetes: Followed at [facility]. 4. OSA: Continue CPAP.  5. Depression: Per PCP.   Followup 3 months with APP  Marca Ancona, MD 08/17/22

## 2022-08-18 ENCOUNTER — Other Ambulatory Visit (HOSPITAL_COMMUNITY): Payer: Self-pay

## 2022-08-18 ENCOUNTER — Telehealth (HOSPITAL_COMMUNITY): Payer: Self-pay | Admitting: Pharmacy Technician

## 2022-08-18 NOTE — Telephone Encounter (Signed)
Patient Advocate Encounter   Received notification from Medicaid that prior authorization for Jardiance is required.   PA submitted on [employer] [id number] W Status is pending   Will continue to follow.

## 2022-08-21 LAB — LIPOPROTEIN A (LPA): Lipoprotein (a): 46.5 nmol/L — ABNORMAL HIGH (ref <75.0)

## 2022-08-22 ENCOUNTER — Other Ambulatory Visit (HOSPITAL_COMMUNITY): Payer: Self-pay

## 2022-08-22 NOTE — Telephone Encounter (Addendum)
Advanced Heart Failure Patient Advocate Encounter  Prior Authorization for [place] has been approved.    PA# [id number] Effective dates: 08/18/22 through 08/18/23  Patients co-pay is $4   Will route to provider. Patient no longer has va insurance, want to confirm if we can go to a full tablet (25mg ) daily or stay on half tablet (12.5mg ) daily.  Archer Asa, CPhT

## 2022-08-29 ENCOUNTER — Ambulatory Visit (INDEPENDENT_AMBULATORY_CARE_PROVIDER_SITE_OTHER): Payer: Medicaid Other

## 2022-08-29 DIAGNOSIS — I5022 Chronic systolic (congestive) heart failure: Secondary | ICD-10-CM

## 2022-08-29 DIAGNOSIS — I255 Ischemic cardiomyopathy: Secondary | ICD-10-CM

## 2022-08-29 LAB — CUP PACEART REMOTE DEVICE CHECK
Battery Remaining Longevity: 168 mo
Battery Remaining Percentage: 100 %
Brady Statistic RV Percent Paced: 0 %
Date Time Interrogation Session: 20240827020700
HighPow Impedance: 76 Ohm
Implantable Lead Connection Status: 753985
Implantable Lead Implant Date: 20230215
Implantable Lead Location: 753860
Implantable Lead Model: 138
Implantable Lead Serial Number: 302859
Implantable Pulse Generator Implant Date: 20230215
Lead Channel Impedance Value: 495 Ohm
Lead Channel Pacing Threshold Amplitude: 0.9 V
Lead Channel Pacing Threshold Pulse Width: 0.4 ms
Lead Channel Setting Pacing Amplitude: 2.5 V
Lead Channel Setting Pacing Pulse Width: 0.4 ms
Lead Channel Setting Sensing Sensitivity: 0.5 mV
Pulse Gen Serial Number: 215773

## 2022-09-11 NOTE — Progress Notes (Signed)
Remote ICD transmission.   

## 2022-09-25 ENCOUNTER — Encounter: Payer: Self-pay | Admitting: General Practice

## 2022-11-17 ENCOUNTER — Encounter (HOSPITAL_COMMUNITY): Payer: No Typology Code available for payment source

## 2022-11-28 ENCOUNTER — Ambulatory Visit (INDEPENDENT_AMBULATORY_CARE_PROVIDER_SITE_OTHER): Payer: Medicaid Other

## 2022-11-28 DIAGNOSIS — I255 Ischemic cardiomyopathy: Secondary | ICD-10-CM

## 2022-11-28 DIAGNOSIS — I5022 Chronic systolic (congestive) heart failure: Secondary | ICD-10-CM | POA: Diagnosis not present

## 2022-12-01 LAB — CUP PACEART REMOTE DEVICE CHECK
Battery Remaining Longevity: 162 mo
Battery Remaining Percentage: 100 %
Brady Statistic RV Percent Paced: 0 %
Date Time Interrogation Session: 20241129023300
HighPow Impedance: 85 Ohm
Implantable Lead Connection Status: 753985
Implantable Lead Implant Date: 20230215
Implantable Lead Location: 753860
Implantable Lead Model: 138
Implantable Lead Serial Number: 302859
Implantable Pulse Generator Implant Date: 20230215
Lead Channel Impedance Value: 496 Ohm
Lead Channel Pacing Threshold Amplitude: 0.8 V
Lead Channel Pacing Threshold Pulse Width: 0.4 ms
Lead Channel Setting Pacing Amplitude: 2.5 V
Lead Channel Setting Pacing Pulse Width: 0.4 ms
Lead Channel Setting Sensing Sensitivity: 0.5 mV
Pulse Gen Serial Number: 215773

## 2022-12-11 ENCOUNTER — Ambulatory Visit (HOSPITAL_COMMUNITY)
Admission: RE | Admit: 2022-12-11 | Discharge: 2022-12-11 | Disposition: A | Discharge status: Home / Self Care | Payer: Medicaid Other | Source: Ambulatory Visit | Attending: Family Medicine | Admitting: Family Medicine

## 2022-12-11 ENCOUNTER — Encounter (HOSPITAL_COMMUNITY): Payer: Self-pay

## 2022-12-11 VITALS — BP 138/72 | HR 72 | Wt 284.0 lb

## 2022-12-11 DIAGNOSIS — Z79899 Other long term (current) drug therapy: Secondary | ICD-10-CM | POA: Insufficient documentation

## 2022-12-11 DIAGNOSIS — G4733 Obstructive sleep apnea (adult) (pediatric): Secondary | ICD-10-CM | POA: Diagnosis not present

## 2022-12-11 DIAGNOSIS — I2510 Atherosclerotic heart disease of native coronary artery without angina pectoris: Secondary | ICD-10-CM | POA: Insufficient documentation

## 2022-12-11 DIAGNOSIS — Z955 Presence of coronary angioplasty implant and graft: Secondary | ICD-10-CM | POA: Insufficient documentation

## 2022-12-11 DIAGNOSIS — E119 Type 2 diabetes mellitus without complications: Secondary | ICD-10-CM | POA: Diagnosis not present

## 2022-12-11 DIAGNOSIS — Z7902 Long term (current) use of antithrombotics/antiplatelets: Secondary | ICD-10-CM | POA: Insufficient documentation

## 2022-12-11 DIAGNOSIS — F32A Depression, unspecified: Secondary | ICD-10-CM | POA: Insufficient documentation

## 2022-12-11 DIAGNOSIS — I110 Hypertensive heart disease with heart failure: Secondary | ICD-10-CM | POA: Diagnosis not present

## 2022-12-11 DIAGNOSIS — Z8249 Family history of ischemic heart disease and other diseases of the circulatory system: Secondary | ICD-10-CM | POA: Diagnosis not present

## 2022-12-11 DIAGNOSIS — I5022 Chronic systolic (congestive) heart failure: Secondary | ICD-10-CM | POA: Insufficient documentation

## 2022-12-11 DIAGNOSIS — Z7984 Long term (current) use of oral hypoglycemic drugs: Secondary | ICD-10-CM | POA: Diagnosis not present

## 2022-12-11 DIAGNOSIS — I252 Old myocardial infarction: Secondary | ICD-10-CM | POA: Diagnosis not present

## 2022-12-11 DIAGNOSIS — Z7985 Long-term (current) use of injectable non-insulin antidiabetic drugs: Secondary | ICD-10-CM | POA: Diagnosis not present

## 2022-12-11 DIAGNOSIS — Z87891 Personal history of nicotine dependence: Secondary | ICD-10-CM | POA: Diagnosis not present

## 2022-12-11 DIAGNOSIS — I255 Ischemic cardiomyopathy: Secondary | ICD-10-CM | POA: Diagnosis not present

## 2022-12-11 LAB — BASIC METABOLIC PANEL
Anion gap: 10 (ref 5–15)
BUN: 12 mg/dL (ref 6–20)
CO2: 25 mmol/L (ref 22–32)
Calcium: 9.9 mg/dL (ref 8.9–10.3)
Chloride: 100 mmol/L (ref 98–111)
Creatinine, Ser: 0.8 mg/dL (ref 0.61–1.24)
GFR, Estimated: >60 mL/min (ref >60)
Glucose, Bld: 212 mg/dL — ABNORMAL HIGH (ref 70–99)
Potassium: 4.2 mmol/L (ref 3.5–5.1)
Sodium: 135 mmol/L (ref 135–145)

## 2022-12-11 LAB — LIPID PANEL
Cholesterol: 240 mg/dL — ABNORMAL HIGH (ref 0–200)
HDL: 44 mg/dL (ref >40)
LDL Cholesterol: 130 mg/dL — ABNORMAL HIGH (ref 0–99)
Total CHOL/HDL Ratio: 5.5 {ratio}
Triglycerides: 332 mg/dL — ABNORMAL HIGH (ref <150)
VLDL: 66 mg/dL — ABNORMAL HIGH (ref 0–40)

## 2022-12-11 LAB — BRAIN NATRIURETIC PEPTIDE: B Natriuretic Peptide: 101.9 pg/mL — ABNORMAL HIGH (ref 0.0–100.0)

## 2022-12-11 MED ORDER — CARVEDILOL 25 MG PO TABS: 25.0000 mg | ORAL_TABLET | Freq: Two times a day (BID) | ORAL | 3 refills | Status: DC

## 2022-12-11 NOTE — Patient Instructions (Addendum)
Thank you for coming in today  If you had labs drawn today, any labs that are abnormal the clinic will call you No news is good news  Medications: Increase Coreg to 25 mg 1 tablet twice daily   Follow up appointments:  Your physician recommends that you schedule a follow-up appointment in:  3 months in clinic   Do the following things EVERYDAY: Weigh yourself in the morning before breakfast. Write it down and keep it in a log. Take your medicines as prescribed Eat low salt foods--Limit salt (sodium) to 2000 mg per day.  Stay as active as you can everyday Limit all fluids for the day to less than 2 liters   At the [facility], you and your health needs are our priority. As part of our continuing mission to provide you with exceptional heart care, we have created designated Provider Care Teams. These Care Teams include your primary Cardiologist (physician) and Advanced Practice Providers (APPs- Physician Assistants and Nurse Practitioners) who all work together to provide you with the care you need, when you need it.   You may see any of the following providers on your designated Care Team at your next follow up: Dr Arvilla Meres Dr Marca Ancona Dr. Marcos Eke, NP Robbie Lis, [place] [facility] [place], [place] Brynda Peon, NP Karle Plumber, PharmD   Please be sure to bring in all your medications bottles to every appointment.    Thank you for choosing [facility]  If you have any questions or concerns before your next appointment please send [place] a message through [place] or call our office at [phone].    TO LEAVE A MESSAGE FOR THE NURSE SELECT OPTION 2, PLEASE LEAVE A MESSAGE INCLUDING: YOUR NAME DATE OF BIRTH CALL BACK NUMBER REASON FOR CALL**this is important as we prioritize the call backs  YOU WILL RECEIVE A CALL BACK THE SAME DAY AS LONG AS YOU CALL BEFORE 4:00 PM

## 2022-12-11 NOTE — Progress Notes (Signed)
Advanced Heart Failure Clinic Note    PCP: Wendi Maya, MD Primary Cardiologist: Kristeen Miss, MD  HF Cardiologist: Dr. Lillia Pauls Fortino is a 57 y.o. with history of CAD, HTN, type 2 diabetes, OSA but not on CPAP and ischemic CMP presents for followup of CAD and CHF.  Patient was admitted in 5/19 with unstable angina.  Cath showed 90% proximal stenosis of nondominant RCA and 40% stenosis proximal LAD.  Echo showed EF 60-65%.  He was managed medically.  He was lost to followup as an outpatient until 9/22.    Transferred from [place] to [facility] on 09/19/20 with anterior STEMI and cardiogenic shock. He was hypotensive on arrival and started on norepinephrine. Coronary angiogram demonstrated 90% thrombotic stenosis in mid LAD treated with PCI/DES, suspected embolic disease causing occlusion in distal LAD, chronic OM1 occlusion with collaterals, 90% proximal non-dominant RCA. RHC showed mean RA 9, PA 26/11, mean PCWP 16 with LVEDP 23, CI 2.46. Echo 9/22 with LVEF of 25-30%, WMA consistent with large LAD territory infarct, no LV thrombus, RV okay, RVSP 30 mmHg, no MR.  Diuresed with IV lasix. Initiated on GDMT with Entresto, empagliflozin, and spironolactone. BP too soft for beta blocker. LifeVest placed prior to discharge; discharge weight 220 lbs.    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   Follow up with 2/23 stable NYHA II symptoms, carvedilol increased. Referred to CR at [facility].   S/p [employer] ICD implant 2/23.  Echo 8/24 EF 35-40% with wall motion abnormalities, mild LVH, grade 2 diastolic dysfunction, normal RV, IVC normal.   Today he returns for HF follow up. Overall feeling fine. He is not SOB walking up steps. He had 1 episode of dizziness when bending over, but none since. No falls. Denies palpitations,  CP, abnormal bleeding, edema, or PND/Orthopnea. Appetite ok. No fever or chills. He is not weighing at home. Taking all  medications. He wears CPAP at night.   ECG (personally reviewed): none ordered today.  [employer] device interrogation: HeartLogic 2, no VT, 2.1 hr/day activity  Labs (9/22): LDL 105 Labs (11/22): K 3.8, creatinine 0.64 Labs (1/23): K 4.4, creatinine 0.66 Labs (4/23): K 4.3, creatinine 0.67 Labs (5/23): K 4.4, creatinine 0.82 Labs (5/24): LDL 160, BNP 59, K 4, creatinine 0.66 Labs (8/24): K 4.2, creatinine 0.74, Lp(a), 46.5  PMH: 1. HTN 2. Type 2 diabetes 3. OSA: Using CPAP 4. CAD: Unstable angina in 5/19, cath with 90% proximal nondominant small RCA.  - 9/22 anterior STEMI: 90% thrombotic mid LAD stenosis treated with DES, total occlusion of distal LAD likely by embolic event, totally occluded OM1 with collaterals, 90% proximal stenosis nondominant RCA.  5. Chronic systolic CHF: Ischemic cardiomyopathy.  [employer] ICD.  - Echo (9/22): EF 25-30%, LAD territory WMAs, RV ok.  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  - Echo (8/24): EF 35-40% with wall motion abnormalities, mild LVH, grade 2 diastolic dysfunction, normal RV, IVC normal.  Current Outpatient Medications  Medication Sig Dispense Refill   acetaminophen (TYLENOL) 325 MG tablet Take 650 mg by mouth every 6 (six) hours as needed for moderate pain.     carvedilol (COREG) 12.5 MG tablet Take 1.5 tablets (18.75 mg total) by mouth 2 (two) times daily. 180 tablet 3   clopidogrel (PLAVIX) 75 MG tablet Take 1 tablet (75 mg total) by mouth daily. 90 tablet 3   empagliflozin (JARDIANCE) 25 MG TABS  tablet Take 12.5 mg (1/2 tablet) daily 45 tablet 3   ezetimibe (ZETIA) 10 MG tablet Take 1 tablet (10 mg total) by mouth daily. 90 tablet 3   furosemide (LASIX) 20 MG tablet Take 2 tablets (40 mg total) by mouth daily. 180 tablet 3   glipiZIDE (GLUCOTROL) 10 MG tablet Take 10 mg by mouth 2 (two) times daily before a meal.      metFORMIN (GLUCOPHAGE) 500 MG tablet Take 1,000 mg by mouth 2  (two) times daily with a meal.      nitroGLYCERIN (NITROSTAT) 0.4 MG SL tablet Place 1 tablet (0.4 mg total) under the tongue every 5 (five) minutes x 3 doses as needed for chest pain. 25 tablet 0   potassium chloride SA (KLOR-CON M) 20 MEQ tablet Take 1 tablet (20 mEq total) by mouth 2 (two) times daily. 180 tablet 3   rosuvastatin (CRESTOR) 40 MG tablet Take 1 tablet (40 mg total) by mouth daily. 90 tablet 3   sacubitril-valsartan (ENTRESTO) 97-103 MG Take 1 tablet by mouth 2 (two) times daily. 180 tablet 3   Semaglutide,0.25 or 0.5MG /DOS, 2 MG/1.5ML SOPN Inject 0.5 mg as directed every Monday.     spironolactone (ALDACTONE) 25 MG tablet Take 1 tablet (25 mg total) by mouth at bedtime. 90 tablet 3   tamsulosin (FLOMAX) 0.4 MG CAPS capsule Take 0.4 mg by mouth daily.     No current facility-administered medications for this encounter.   No Known Allergies  Social History   Socioeconomic History   Marital status: Divorced    Spouse name: Not on file   Number of children: Not on file   Years of education: Not on file   Highest education level: Not on file  Occupational History   Not on file  Tobacco Use   Smoking status: Former    Current packs/day: 0.00    Average packs/day: 1 pack/day for 30.0 years (30.0 ttl pk-yrs)    Types: Cigarettes    Start date: 01/03/1967    Quit date: 01/02/1997    Years since quitting: 25.9   Smokeless tobacco: Former  Building services engineer status: Never Used  Substance and Sexual Activity   Alcohol use: Yes    Alcohol/week: 2.0 standard drinks of alcohol    Types: 2 Cans of beer per week    Comment: occasional   Drug use: Never   Sexual activity: Not on file  Other Topics Concern   Not on file  Social History Narrative   Lives alone.  Still has support of ex-wife and other relatives   Social Determinants of Health   Financial Resource Strain: High Risk (05/20/2021)   Overall Financial Resource Strain (CARDIA)    Difficulty of Paying Living  Expenses: Very hard  Food Insecurity: Food Insecurity Present (05/20/2021)   Hunger Vital Sign    Worried About Running Out of Food in the Last Year: Sometimes true    Ran Out of Food in the Last Year: Sometimes true  Transportation Needs: No Transportation Needs (05/20/2021)   PRAPARE - Administrator, Civil Service (Medical): No    Lack of Transportation (Non-Medical): No  Physical Activity: Not on file  Stress: Not on file  Social Connections: Not on file  Intimate Partner Violence: Not on file   Family History  Problem Relation Age of Onset   Heart attack Mother    Heart attack Father    Hypertension Father    BP 138/72  Pulse 72   Wt 128.8 kg (284 lb)   SpO2 96%   BMI 36.46 kg/m   Wt Readings from Last 3 Encounters:  12/11/22 128.8 kg (284 lb)  08/17/22 125.8 kg (277 lb 6.4 oz)  05/26/22 128.3 kg (282 lb 12.8 oz)   PHYSICAL EXAM: General:  NAD. No resp difficulty HEENT: Normal Neck: Supple. No JVD. Carotids 2+ bilat; no bruits. No lymphadenopathy or thryomegaly appreciated. Cor: PMI nondisplaced. Regular rate & rhythm. No rubs, gallops or murmurs. Lungs: Clear Abdomen: Soft, nontender, nondistended. No hepatosplenomegaly. No bruits or masses. Good bowel sounds. Extremities: No cyanosis, clubbing, rash, trace pedal edema with varicose veins Neuro: Alert & oriented x 3, cranial nerves grossly intact. Moves all 4 extremities w/o difficulty. Affect pleasant.  ASSESSMENT & PLAN: 1. CAD:  Very strong FH of CAD. History of unstable angina 5/19, medically managed (90% stenosis nondominant RCA).  Admitted with acute anterior MI in 9/22. Cath with 90% thrombotic stenosis mid LAD, suspected embolic occlusion distal LAD, CTO OM1 with collaterals, 90% nondominant RCA.  Patient treated with DES to mid LAD.  No chest pain.  - Continue Plavix 75 mg daily.  - Continue Crestor 40 mg daily + Zetia.  LDL was very high in 5/24, says he was taking Crestor and Zetia.  He was  referred to Lipid Clinic for Repatha.   - Repeat lipids today.  2. Chronic systolic CHF: Ischemic cardiomyopathy.  Echo in 9/22 showed EF 25-30%, LAD wall motion abnormalities, no LV thrombus, normal RV. Echo in 12/22 showed EF 30% with apical aneurysm but no thrombus.  [employer] ICD.  Echo 8/24  showed EF 35-40% with wall motion abnormalities, mild LVH, grade 2 diastolic dysfunction, normal RV, IVC normal.  He is not volume overloaded on exam or by HeartLogic, NYHA class I-II (think peripheral edema is primarily venous insufficiency).   - Increase Coreg to 25 mg bid - Continue Entresto 97/103 mg bid. BMET/BNP today.  - Continue Lasix 40 mg daily + 20 KCL bid. - Continue empagliflozin 12.5 mg daily. No GU symptoms. - Continue spironolactone 25 mg daily.  3. Diabetes: Followed at [facility]. He is on semaglutide. 4. OSA: Continue CPAP.  5. Depression: Per PCP.   Follow up 3 months with APP.  Anderson Malta [place], FNP 12/11/22

## 2022-12-18 ENCOUNTER — Encounter: Payer: Self-pay | Admitting: Cardiology

## 2022-12-28 NOTE — Progress Notes (Signed)
Remote ICD transmission.   

## 2022-12-28 NOTE — Addendum Note (Signed)
Addended by: Geralyn Flash D on: 12/28/2022 04:56 PM   Modules accepted: Orders

## 2023-02-22 IMAGING — DX DG CHEST 1V PORT
1 series · 1 of 1 positions shown · non-contrast
Comparison: January 23, 2019

CLINICAL DATA: Chest pain

EXAM:
PORTABLE CHEST 1 VIEW

[chest]
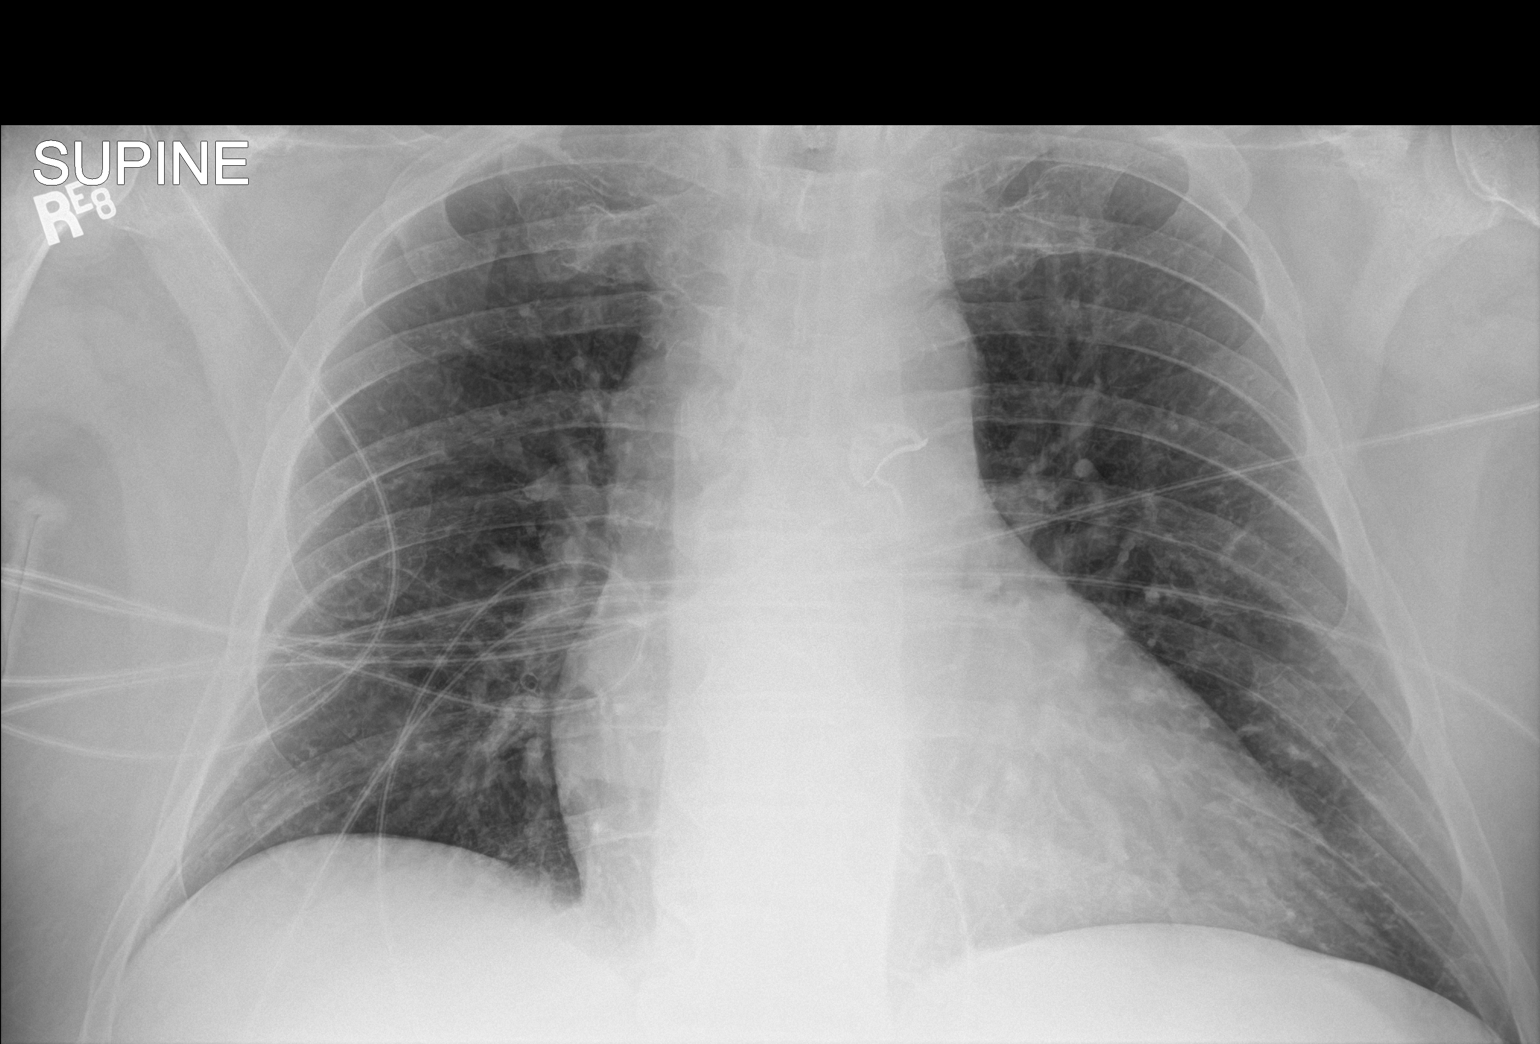

[1 of 1 positions shown; findings below may reference images not displayed]

FINDINGS: The heart size and mediastinal contours are within normal limits.
Both lungs are clear. The visualized skeletal structures are
unremarkable.
IMPRESSION: No active disease.

## 2023-02-27 ENCOUNTER — Ambulatory Visit (INDEPENDENT_AMBULATORY_CARE_PROVIDER_SITE_OTHER): Payer: No Typology Code available for payment source

## 2023-02-27 DIAGNOSIS — I255 Ischemic cardiomyopathy: Secondary | ICD-10-CM | POA: Diagnosis not present

## 2023-03-02 LAB — CUP PACEART REMOTE DEVICE CHECK
Battery Remaining Longevity: 162 mo
Battery Remaining Percentage: 100 %
Brady Statistic RV Percent Paced: 0 %
Date Time Interrogation Session: 20250228020700
HighPow Impedance: 83 Ohm
Implantable Lead Connection Status: 753985
Implantable Lead Implant Date: 20230215
Implantable Lead Location: 753860
Implantable Lead Model: 138
Implantable Lead Serial Number: 302859
Implantable Pulse Generator Implant Date: 20230215
Lead Channel Impedance Value: 512 Ohm
Lead Channel Pacing Threshold Amplitude: 0.7 V
Lead Channel Pacing Threshold Pulse Width: 0.4 ms
Lead Channel Setting Pacing Amplitude: 2.5 V
Lead Channel Setting Pacing Pulse Width: 0.4 ms
Lead Channel Setting Sensing Sensitivity: 0.5 mV
Pulse Gen Serial Number: 215773

## 2023-03-02 NOTE — Progress Notes (Incomplete)
Advanced Heart Failure Clinic Note    PCP: Wendi Maya, MD Primary Cardiologist: Kristeen Miss, MD  HF Cardiologist: Dr. Lillia Pauls [place] is a 58 y.o. with history of CAD, HTN, type 2 diabetes, OSA but not on CPAP and ischemic CMP presents for followup of CAD and CHF.  Patient was admitted in 5/19 with unstable angina.  Cath showed 90% proximal stenosis of nondominant RCA and 40% stenosis proximal LAD.  Echo showed EF 60-65%.  He was managed medically.  He was lost to followup as an outpatient until 9/22.    Transferred from [place] to [facility] on 09/19/20 with anterior STEMI and cardiogenic shock. He was hypotensive on arrival and started on norepinephrine. Coronary angiogram demonstrated 90% thrombotic stenosis in mid LAD treated with PCI/DES, suspected embolic disease causing occlusion in distal LAD, chronic OM1 occlusion with collaterals, 90% proximal non-dominant RCA. RHC showed mean RA 9, PA 26/11, mean PCWP 16 with LVEDP 23, CI 2.46. Echo 9/22 with LVEF of 25-30%, WMA consistent with large LAD territory infarct, no LV thrombus, RV okay, RVSP 30 mmHg, no MR.  Diuresed with IV lasix. Initiated on GDMT with Entresto, empagliflozin, and spironolactone. BP too soft for beta blocker. LifeVest placed prior to discharge; discharge weight 220 lbs.    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   Follow up with 2/23 stable NYHA II symptoms, carvedilol increased. Referred to CR at [facility].   S/p [employer] ICD implant 2/23.  Echo 8/24 EF 35-40% with wall motion abnormalities, mild LVH, grade 2 diastolic dysfunction, normal RV, IVC normal.   Today he returns for HF follow up. Overall feeling fine. He is not SOB walking up steps. He had 1 episode of dizziness when bending over, but none since. No falls. Denies palpitations,  CP, abnormal bleeding, edema, or PND/Orthopnea. Appetite ok. No fever or chills. He is not weighing at home. Taking all  medications. He wears CPAP at night.   ECG (personally reviewed): none ordered today.  [employer] device interrogation: HeartLogic 2, no VT, 2.1 hr/day activity  Labs (9/22): LDL 105 Labs (11/22): K 3.8, creatinine 0.64 Labs (1/23): K 4.4, creatinine 0.66 Labs (4/23): K 4.3, creatinine 0.67 Labs (5/23): K 4.4, creatinine 0.82 Labs (5/24): LDL 160, BNP 59, K 4, creatinine 0.66 Labs (8/24): K 4.2, creatinine 0.74, Lp(a), 46.5  PMH: 1. HTN 2. Type 2 diabetes 3. OSA: Using CPAP 4. CAD: Unstable angina in 5/19, cath with 90% proximal nondominant small RCA.  - 9/22 anterior STEMI: 90% thrombotic mid LAD stenosis treated with DES, total occlusion of distal LAD likely by embolic event, totally occluded OM1 with collaterals, 90% proximal stenosis nondominant RCA.  5. Chronic systolic CHF: Ischemic cardiomyopathy.  [employer] ICD.  - Echo (9/22): EF 25-30%, LAD territory WMAs, RV ok.  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  - Echo (8/24): EF 35-40% with wall motion abnormalities, mild LVH, grade 2 diastolic dysfunction, normal RV, IVC normal.  Current Outpatient Medications  Medication Sig Dispense Refill   acetaminophen (TYLENOL) 325 MG tablet Take 650 mg by mouth every 6 (six) hours as needed for moderate pain.     carvedilol (COREG) 25 MG tablet Take 1 tablet (25 mg total) by mouth 2 (two) times daily. 180 tablet 3   clopidogrel (PLAVIX) 75 MG tablet Take 1 tablet (75 mg total) by mouth daily. 90 tablet 3   empagliflozin (JARDIANCE) 25 MG TABS  tablet Take 12.5 mg (1/2 tablet) daily 45 tablet 3   ezetimibe (ZETIA) 10 MG tablet Take 1 tablet (10 mg total) by mouth daily. 90 tablet 3   furosemide (LASIX) 20 MG tablet Take 2 tablets (40 mg total) by mouth daily. 180 tablet 3   glipiZIDE (GLUCOTROL) 10 MG tablet Take 10 mg by mouth 2 (two) times daily before a meal.      metFORMIN (GLUCOPHAGE) 500 MG tablet Take 1,000 mg by mouth 2 (two)  times daily with a meal.      nitroGLYCERIN (NITROSTAT) 0.4 MG SL tablet Place 1 tablet (0.4 mg total) under the tongue every 5 (five) minutes x 3 doses as needed for chest pain. 25 tablet 0   potassium chloride SA (KLOR-CON M) 20 MEQ tablet Take 1 tablet (20 mEq total) by mouth 2 (two) times daily. 180 tablet 3   rosuvastatin (CRESTOR) 40 MG tablet Take 1 tablet (40 mg total) by mouth daily. 90 tablet 3   sacubitril-valsartan (ENTRESTO) 97-103 MG Take 1 tablet by mouth 2 (two) times daily. 180 tablet 3   Semaglutide,0.25 or 0.5MG /DOS, 2 MG/1.5ML SOPN Inject 0.5 mg as directed every Monday.     spironolactone (ALDACTONE) 25 MG tablet Take 1 tablet (25 mg total) by mouth at bedtime. 90 tablet 3   tamsulosin (FLOMAX) 0.4 MG CAPS capsule Take 0.4 mg by mouth daily.     No current facility-administered medications for this visit.   No Known Allergies  Social History   Socioeconomic History   Marital status: Divorced    Spouse name: Not on file   Number of children: Not on file   Years of education: Not on file   Highest education level: Not on file  Occupational History   Not on file  Tobacco Use   Smoking status: Former    Current packs/day: 0.00    Average packs/day: 1 pack/day for 30.0 years (30.0 ttl pk-yrs)    Types: Cigarettes    Start date: 01/03/1967    Quit date: 01/02/1997    Years since quitting: 26.1   Smokeless tobacco: Former  Building services engineer status: Never Used  Substance and Sexual Activity   Alcohol use: Yes    Alcohol/week: 2.0 standard drinks of alcohol    Types: 2 Cans of beer per week    Comment: occasional   Drug use: Never   Sexual activity: Not on file  Other Topics Concern   Not on file  Social History Narrative   Lives alone.  Still has support of ex-wife and other relatives   Social Drivers of Health   Financial Resource Strain: High Risk (05/20/2021)   Overall Financial Resource Strain (CARDIA)    Difficulty of Paying Living Expenses: Very hard   Food Insecurity: Food Insecurity Present (05/20/2021)   Hunger Vital Sign    Worried About Running Out of Food in the Last Year: Sometimes true    Ran Out of Food in the Last Year: Sometimes true  Transportation Needs: No Transportation Needs (05/20/2021)   PRAPARE - Administrator, Civil Service (Medical): No    Lack of Transportation (Non-Medical): No  Physical Activity: Not on file  Stress: Not on file  Social Connections: Not on file  Intimate Partner Violence: Not on file   Family History  Problem Relation Age of Onset   Heart attack Mother    Heart attack Father    Hypertension Father    There were no vitals  taken for this visit.  Wt Readings from Last 3 Encounters:  12/11/22 128.8 kg (284 lb)  08/17/22 125.8 kg (277 lb 6.4 oz)  05/26/22 128.3 kg (282 lb 12.8 oz)   PHYSICAL EXAM: General:  NAD. No resp difficulty HEENT: Normal Neck: Supple. No JVD. Carotids 2+ bilat; no bruits. No lymphadenopathy or thryomegaly appreciated. Cor: PMI nondisplaced. Regular rate & rhythm. No rubs, gallops or murmurs. Lungs: Clear Abdomen: Soft, nontender, nondistended. No hepatosplenomegaly. No bruits or masses. Good bowel sounds. Extremities: No cyanosis, clubbing, rash, trace pedal edema with varicose veins Neuro: Alert & oriented x 3, cranial nerves grossly intact. Moves all 4 extremities w/o difficulty. Affect pleasant.  ASSESSMENT & PLAN: 1. CAD:  Very strong FH of CAD. History of unstable angina 5/19, medically managed (90% stenosis nondominant RCA).  Admitted with acute anterior MI in 9/22. Cath with 90% thrombotic stenosis mid LAD, suspected embolic occlusion distal LAD, CTO OM1 with collaterals, 90% nondominant RCA.  Patient treated with DES to mid LAD.  No chest pain.  - Continue Plavix 75 mg daily.  - Continue Crestor 40 mg daily + Zetia.  LDL was very high in 5/24, says he was taking Crestor and Zetia.  He was referred to Lipid Clinic for Repatha.   - Repeat lipids  today.  2. Chronic systolic CHF: Ischemic cardiomyopathy.  Echo in 9/22 showed EF 25-30%, LAD wall motion abnormalities, no LV thrombus, normal RV. Echo in 12/22 showed EF 30% with apical aneurysm but no thrombus.  [employer] ICD.  Echo 8/24  showed EF 35-40% with wall motion abnormalities, mild LVH, grade 2 diastolic dysfunction, normal RV, IVC normal.  He is not volume overloaded on exam or by HeartLogic, NYHA class I-II (think peripheral edema is primarily venous insufficiency).   - Increase Coreg to 25 mg bid - Continue Entresto 97/103 mg bid. BMET/BNP today.  - Continue Lasix 40 mg daily + 20 KCL bid. - Continue empagliflozin 12.5 mg daily. No GU symptoms. - Continue spironolactone 25 mg daily.  3. Diabetes: Followed at [facility]. He is on semaglutide. 4. OSA: Continue CPAP.  5. Depression: Per PCP.   Follow up 3 months with APP.  Anderson Malta [place], FNP 03/02/23

## 2023-03-12 ENCOUNTER — Encounter (HOSPITAL_COMMUNITY): Payer: Self-pay

## 2023-03-12 ENCOUNTER — Encounter (HOSPITAL_COMMUNITY): Payer: No Typology Code available for payment source

## 2023-04-05 NOTE — Addendum Note (Signed)
 Addended by: Elease Etienne A on: 04/05/2023 11:17 AM   Modules accepted: Orders

## 2023-04-05 NOTE — Progress Notes (Signed)
 Remote ICD transmission.

## 2023-04-10 ENCOUNTER — Telehealth (HOSPITAL_COMMUNITY): Payer: Self-pay

## 2023-04-10 NOTE — Telephone Encounter (Signed)
 Called to confirm/remind patient of their appointment at the [facility] on 04/11/23.   Appointment:   [x] Confirmed  [] Left mess   [] No answer/No voice mail  [] Phone not in service  Patient reminded to bring all medications and/or complete list.  Confirmed patient has transportation. Gave directions, instructed to utilize valet parking.

## 2023-04-11 ENCOUNTER — Encounter (HOSPITAL_COMMUNITY): Payer: Self-pay

## 2023-04-11 ENCOUNTER — Ambulatory Visit (HOSPITAL_COMMUNITY)
Admission: RE | Admit: 2023-04-11 | Discharge: 2023-04-11 | Disposition: A | Discharge status: Home / Self Care | Source: Ambulatory Visit | Attending: Family Medicine | Admitting: Family Medicine

## 2023-04-11 VITALS — BP 140/64 | HR 65 | Ht 74.0 in | Wt 280.6 lb

## 2023-04-11 DIAGNOSIS — Z79899 Other long term (current) drug therapy: Secondary | ICD-10-CM | POA: Insufficient documentation

## 2023-04-11 DIAGNOSIS — Z7902 Long term (current) use of antithrombotics/antiplatelets: Secondary | ICD-10-CM | POA: Insufficient documentation

## 2023-04-11 DIAGNOSIS — E1169 Type 2 diabetes mellitus with other specified complication: Secondary | ICD-10-CM | POA: Diagnosis not present

## 2023-04-11 DIAGNOSIS — Z6837 Body mass index (BMI) 37.0-37.9, adult: Secondary | ICD-10-CM | POA: Diagnosis not present

## 2023-04-11 DIAGNOSIS — E1159 Type 2 diabetes mellitus with other circulatory complications: Secondary | ICD-10-CM | POA: Diagnosis not present

## 2023-04-11 DIAGNOSIS — Z5986 Financial insecurity: Secondary | ICD-10-CM | POA: Insufficient documentation

## 2023-04-11 DIAGNOSIS — Z8249 Family history of ischemic heart disease and other diseases of the circulatory system: Secondary | ICD-10-CM | POA: Insufficient documentation

## 2023-04-11 DIAGNOSIS — Z87891 Personal history of nicotine dependence: Secondary | ICD-10-CM | POA: Diagnosis not present

## 2023-04-11 DIAGNOSIS — E11620 Type 2 diabetes mellitus with diabetic dermatitis: Secondary | ICD-10-CM | POA: Diagnosis not present

## 2023-04-11 DIAGNOSIS — R0989 Other specified symptoms and signs involving the circulatory and respiratory systems: Secondary | ICD-10-CM | POA: Diagnosis not present

## 2023-04-11 DIAGNOSIS — I10 Essential (primary) hypertension: Secondary | ICD-10-CM | POA: Diagnosis not present

## 2023-04-11 DIAGNOSIS — Z7985 Long-term (current) use of injectable non-insulin antidiabetic drugs: Secondary | ICD-10-CM | POA: Insufficient documentation

## 2023-04-11 DIAGNOSIS — G4733 Obstructive sleep apnea (adult) (pediatric): Secondary | ICD-10-CM | POA: Insufficient documentation

## 2023-04-11 DIAGNOSIS — I5022 Chronic systolic (congestive) heart failure: Secondary | ICD-10-CM | POA: Diagnosis not present

## 2023-04-11 DIAGNOSIS — I110 Hypertensive heart disease with heart failure: Secondary | ICD-10-CM | POA: Diagnosis not present

## 2023-04-11 DIAGNOSIS — I252 Old myocardial infarction: Secondary | ICD-10-CM | POA: Diagnosis not present

## 2023-04-11 DIAGNOSIS — Z794 Long term (current) use of insulin: Secondary | ICD-10-CM | POA: Diagnosis not present

## 2023-04-11 DIAGNOSIS — I2510 Atherosclerotic heart disease of native coronary artery without angina pectoris: Secondary | ICD-10-CM | POA: Insufficient documentation

## 2023-04-11 DIAGNOSIS — Z7984 Long term (current) use of oral hypoglycemic drugs: Secondary | ICD-10-CM | POA: Diagnosis not present

## 2023-04-11 DIAGNOSIS — E119 Type 2 diabetes mellitus without complications: Secondary | ICD-10-CM | POA: Diagnosis not present

## 2023-04-11 DIAGNOSIS — E785 Hyperlipidemia, unspecified: Secondary | ICD-10-CM | POA: Diagnosis not present

## 2023-04-11 LAB — BRAIN NATRIURETIC PEPTIDE: B Natriuretic Peptide: 98.9 pg/mL (ref 0.0–100.0)

## 2023-04-11 LAB — BASIC METABOLIC PANEL WITH GFR
Anion gap: 10 (ref 5–15)
BUN: 14 mg/dL (ref 6–20)
CO2: 26 mmol/L (ref 22–32)
Calcium: 9.3 mg/dL (ref 8.9–10.3)
Chloride: 100 mmol/L (ref 98–111)
Creatinine, Ser: 0.75 mg/dL (ref 0.61–1.24)
GFR, Estimated: >60 mL/min (ref >60)
Glucose, Bld: 415 mg/dL — ABNORMAL HIGH (ref 70–99)
Potassium: 4.4 mmol/L (ref 3.5–5.1)
Sodium: 136 mmol/L (ref 135–145)

## 2023-04-11 NOTE — Progress Notes (Signed)
 Advanced [facility] Failure Clinic Note    PCP: Wendi Maya, MD Primary Cardiologist: Kristeen Miss, MD  HF Cardiologist: Dr. Lillia Pauls Tommy Liu is a 58 y.o. with history of CAD, HTN, type 2 diabetes, OSA but not on CPAP and ischemic CMP presents for followup of CAD and CHF.  Patient was admitted in 5/19 with unstable angina.  Cath showed 90% proximal stenosis of nondominant RCA and 40% stenosis proximal LAD.  Echo showed EF 60-65%.  He was managed medically.  He was lost to followup as an outpatient until 9/22.    Transferred from [facility] to [facility] on 09/19/20 with anterior STEMI and cardiogenic shock. He was hypotensive on arrival and started on norepinephrine. Coronary angiogram demonstrated 90% thrombotic stenosis in mid LAD treated with PCI/DES, suspected embolic disease causing occlusion in distal LAD, chronic OM1 occlusion with collaterals, 90% proximal non-dominant RCA. RHC showed mean RA 9, PA 26/11, mean PCWP 16 with LVEDP 23, CI 2.46. Echo 9/22 with LVEF of 25-30%, WMA consistent with large LAD territory infarct, no LV thrombus, RV okay, RVSP 30 mmHg, no MR.  Diuresed with IV lasix. Initiated on GDMT with Entresto, empagliflozin, and spironolactone. BP too soft for beta blocker. LifeVest placed prior to discharge; discharge weight 220 lbs.    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   Follow up with 2/23 stable NYHA II symptoms, carvedilol increased. Referred to CR at [facility].   S/p [employer] ICD implant 2/23.  Echo 8/24 EF 35-40% with wall motion abnormalities, mild LVH, grade 2 diastolic dysfunction, normal RV, IVC normal.   Today he returns for HF follow up. Overall feeling fine. Main complaint is poor sleep. He has SOB walking up steps. He walks his dog around his property without SOB.  Chronic LLE swelling. Occasional positional dizziness, no falls. Denies palpitations, abnormal bleeding, CP, edema, or PND/Orthopnea.  Appetite ok. No fever or chills. Not weighing at home. Taking all medications. He wears CPAP at night.   ECG (personally reviewed): NSR 63 bpm  [employer] device interrogation: HeartLogic 2, no VT, 2.2 hr/day activity, average HR 58 bpm  Labs (5/24): LDL 160, BNP 59, K 4, creatinine 0.66 Labs (8/24): K 4.2, creatinine 0.74, Lp(a), 46.5 Labs (12/24): K 4.2, creatinine 0.80, LDL 130  PMH: 1. HTN 2. Type 2 diabetes 3. OSA: Using CPAP 4. CAD: Unstable angina in 5/19, cath with 90% proximal nondominant small RCA.  - 9/22 anterior STEMI: 90% thrombotic mid LAD stenosis treated with DES, total occlusion of distal LAD likely by embolic event, totally occluded OM1 with collaterals, 90% proximal stenosis nondominant RCA.  5. Chronic systolic CHF: Ischemic cardiomyopathy.  [employer] ICD.  - Echo (9/22): EF 25-30%, LAD territory WMAs, RV ok.  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  - Echo (8/24): EF 35-40% with wall motion abnormalities, mild LVH, grade 2 diastolic dysfunction, normal RV, IVC normal.  Current Outpatient Medications  Medication Sig Dispense Refill   acetaminophen (TYLENOL) 325 MG tablet Take 650 mg by mouth every 6 (six) hours as needed for moderate pain.     clopidogrel (PLAVIX) 75 MG tablet Take 1 tablet (75 mg total) by mouth daily. 90 tablet 3   empagliflozin (JARDIANCE) 25 MG TABS tablet Take 12.5 mg (1/2 tablet) daily 45 tablet 3   ezetimibe (ZETIA) 10 MG tablet Take 1 tablet (10 mg total) by mouth daily. 90 tablet 3   furosemide (LASIX)  20 MG tablet Take 2 tablets (40 mg total) by mouth daily. 180 tablet 3   metFORMIN (GLUCOPHAGE) 500 MG tablet Take 1,000 mg by mouth 2 (two) times daily with a meal.      nitroGLYCERIN (NITROSTAT) 0.4 MG SL tablet Place 1 tablet (0.4 mg total) under the tongue every 5 (five) minutes x 3 doses as needed for chest pain. 25 tablet 0   potassium chloride SA (KLOR-CON M) 20 MEQ tablet Take 1  tablet (20 mEq total) by mouth 2 (two) times daily. 180 tablet 3   rosuvastatin (CRESTOR) 40 MG tablet Take 1 tablet (40 mg total) by mouth daily. 90 tablet 3   sacubitril-valsartan (ENTRESTO) 97-103 MG Take 1 tablet by mouth 2 (two) times daily. 180 tablet 3   spironolactone (ALDACTONE) 25 MG tablet Take 1 tablet (25 mg total) by mouth at bedtime. 90 tablet 3   tamsulosin (FLOMAX) 0.4 MG CAPS capsule Take 0.4 mg by mouth daily.     TRULICITY 0.75 MG/0.5ML SOAJ Inject 0.75 mg into the skin once a week.     carvedilol (COREG) 25 MG tablet Take 1 tablet (25 mg total) by mouth 2 (two) times daily. 180 tablet 3   No current facility-administered medications for this encounter.   No Known Allergies  Social History   Socioeconomic History   Marital status: Divorced    Spouse name: Not on file   Number of children: Not on file   Years of education: Not on file   Highest education level: Not on file  Occupational History   Not on file  Tobacco Use   Smoking status: Former    Current packs/day: 0.00    Average packs/day: 1 pack/day for 30.0 years (30.0 ttl pk-yrs)    Types: Cigarettes    Start date: 01/03/1967    Quit date: 01/02/1997    Years since quitting: 26.2   Smokeless tobacco: Former  Building services engineer status: Never Used  Substance and Sexual Activity   Alcohol use: Yes    Alcohol/week: 2.0 standard drinks of alcohol    Types: 2 Cans of beer per week    Comment: occasional   Drug use: Never   Sexual activity: Not on file  Other Topics Concern   Not on file  Social History Narrative   Lives alone.  Still has support of ex-wife and other relatives   Social Drivers of Health   Financial Resource Strain: High Risk (05/20/2021)   Overall Financial Resource Strain (CARDIA)    Difficulty of Paying Living Expenses: Very hard  Food Insecurity: Food Insecurity Present (05/20/2021)   Hunger Vital Sign    Worried About Running Out of Food in the Last Year: Sometimes true    Ran  Out of Food in the Last Year: Sometimes true  Transportation Needs: No Transportation Needs (05/20/2021)   PRAPARE - Administrator, Civil Service (Medical): No    Lack of Transportation (Non-Medical): No  Physical Activity: Not on file  Stress: Not on file  Social Connections: Not on file  Intimate Partner Violence: Not on file   Family History  Problem Relation Age of Onset   Heart attack Mother    Heart attack Father    Hypertension Father    BP (!) 140/64   Pulse 65   Ht 6\' 2"  (1.88 m)   Wt 127.3 kg (280 lb 9.6 oz)   SpO2 98%   BMI 36.03 kg/m   Wt Readings from  Last 3 Encounters:  04/11/23 127.3 kg (280 lb 9.6 oz)  12/11/22 128.8 kg (284 lb)  08/17/22 125.8 kg (277 lb 6.4 oz)   PHYSICAL EXAM: General:  NAD. No resp difficulty, walked into clinic HEENT: Normal Neck: Supple. No JVD. Thick neck Cor: Regular rate & rhythm. No rubs, gallops or murmurs. Lungs: Clear Abdomen: Soft, obese, nontender, nondistended.  Extremities: No cyanosis, clubbing, rash, mild BLE edema; venous stasis changes to BLE Neuro: Alert & oriented x 3, moves all 4 extremities w/o difficulty. Affect pleasant.  ASSESSMENT & PLAN: 1. CAD:  Very strong FH of CAD. History of unstable angina 5/19, medically managed (90% stenosis nondominant RCA).  Admitted with acute anterior MI in 9/22. Cath with 90% thrombotic stenosis mid LAD, suspected embolic occlusion distal LAD, CTO OM1 with collaterals, 90% nondominant RCA.  Patient treated with DES to mid LAD.  No chest pain.  - Continue Plavix 75 mg daily.  - Continue Crestor 40 mg daily + Zetia.  LDL 130. Refer to Lipid Clinic for PCSK9i. 2. Chronic systolic CHF: Ischemic cardiomyopathy.  Echo in 9/22 showed EF 25-30%, LAD wall motion abnormalities, no LV thrombus, normal RV. Echo in 12/22 showed EF 30% with apical aneurysm but no thrombus.  [employer] ICD.  Echo 8/24 showed EF 35-40% with wall motion abnormalities, mild LVH, grade 2 diastolic  dysfunction, normal RV, IVC normal.  He is not volume overloaded on exam or by HeartLogic, NYHA class I-II (think peripheral edema is primarily venous insufficiency).   - Continue Coreg 25 mg bid - Continue Entresto 97/103 mg bid. BMET/BNP today.  - Continue Lasix 40 mg daily + 20 KCL bid. - Continue empagliflozin 12.5 mg daily. No GU symptoms. - Continue spironolactone 25 mg daily.  - Repeat echo next visit. 3. Diabetes: Followed at [facility]. He is on Trulicity 4. OSA: Continue CPAP.  - No change.  Follow up 3 months with Dr. Shirlee Latch + echo  Anderson [place], [place] 04/11/23

## 2023-04-11 NOTE — Patient Instructions (Signed)
 No change in medications today. Labs today - will call you if abnormal. Referral re-sent to Lipid Clinic for Repatha. Return to see Dr. Shirlee Latch in Heart Failure Clinic in 3 months with echo. CALL us AT [phone] IN JUNE TO SCHEDULE THIS APPOINTMENT. Please call us at [phone] if any questions or concerns prior to your next visit.

## 2023-04-12 DIAGNOSIS — Z7189 Other specified counseling: Secondary | ICD-10-CM | POA: Diagnosis not present

## 2023-04-12 DIAGNOSIS — E1169 Type 2 diabetes mellitus with other specified complication: Secondary | ICD-10-CM | POA: Diagnosis not present

## 2023-04-13 ENCOUNTER — Telehealth (HOSPITAL_COMMUNITY): Payer: Self-pay

## 2023-04-13 NOTE — Telephone Encounter (Signed)
 Spoke with patient regarding the following results. Patient made aware and patient verbalized understanding.   Patient states he will reach out to PCP reports that he has been undergoing some medication changes and that he was having some issues affording his diabetes medications. Advised patient to let his PCP know this so that they can make changes. Patient aware and verbalized understanding.

## 2023-04-13 NOTE — Telephone Encounter (Signed)
-----   Message from Jacklynn Ganong sent at 04/11/2023  3:47 PM EDT ----- Blood sugar very elevated. Need to follow up with PCP regarding sugar control

## 2023-04-18 DIAGNOSIS — R0989 Other specified symptoms and signs involving the circulatory and respiratory systems: Secondary | ICD-10-CM | POA: Diagnosis not present

## 2023-05-02 ENCOUNTER — Encounter: Payer: Self-pay | Admitting: Cardiology

## 2023-05-11 DIAGNOSIS — E1165 Type 2 diabetes mellitus with hyperglycemia: Secondary | ICD-10-CM | POA: Diagnosis not present

## 2023-05-11 DIAGNOSIS — Z6836 Body mass index (BMI) 36.0-36.9, adult: Secondary | ICD-10-CM | POA: Diagnosis not present

## 2023-05-11 DIAGNOSIS — R0989 Other specified symptoms and signs involving the circulatory and respiratory systems: Secondary | ICD-10-CM | POA: Diagnosis not present

## 2023-05-29 ENCOUNTER — Ambulatory Visit (INDEPENDENT_AMBULATORY_CARE_PROVIDER_SITE_OTHER): Payer: No Typology Code available for payment source

## 2023-05-29 DIAGNOSIS — I255 Ischemic cardiomyopathy: Secondary | ICD-10-CM

## 2023-05-29 DIAGNOSIS — I5022 Chronic systolic (congestive) heart failure: Secondary | ICD-10-CM

## 2023-05-30 ENCOUNTER — Ambulatory Visit: Payer: Self-pay | Admitting: Cardiology

## 2023-05-30 LAB — CUP PACEART REMOTE DEVICE CHECK
Battery Remaining Longevity: 156 mo
Battery Remaining Percentage: 100 %
Brady Statistic RV Percent Paced: 0 %
Date Time Interrogation Session: 20250527014100
HighPow Impedance: 82 Ohm
Implantable Lead Connection Status: 753985
Implantable Lead Implant Date: 20230215
Implantable Lead Location: 753860
Implantable Lead Model: 138
Implantable Lead Serial Number: 302859
Implantable Pulse Generator Implant Date: 20230215
Lead Channel Impedance Value: 491 Ohm
Lead Channel Pacing Threshold Amplitude: 0.8 V
Lead Channel Pacing Threshold Pulse Width: 0.4 ms
Lead Channel Setting Pacing Amplitude: 2.5 V
Lead Channel Setting Pacing Pulse Width: 0.4 ms
Lead Channel Setting Sensing Sensitivity: 0.5 mV
Pulse Gen Serial Number: 215773

## 2023-06-14 DIAGNOSIS — E1165 Type 2 diabetes mellitus with hyperglycemia: Secondary | ICD-10-CM | POA: Diagnosis not present

## 2023-06-14 DIAGNOSIS — E1169 Type 2 diabetes mellitus with other specified complication: Secondary | ICD-10-CM | POA: Diagnosis not present

## 2023-06-14 DIAGNOSIS — Z713 Dietary counseling and surveillance: Secondary | ICD-10-CM | POA: Diagnosis not present

## 2023-07-02 DIAGNOSIS — Z01818 Encounter for other preprocedural examination: Secondary | ICD-10-CM | POA: Diagnosis not present

## 2023-07-02 DIAGNOSIS — Z6836 Body mass index (BMI) 36.0-36.9, adult: Secondary | ICD-10-CM | POA: Diagnosis not present

## 2023-07-02 DIAGNOSIS — E1165 Type 2 diabetes mellitus with hyperglycemia: Secondary | ICD-10-CM | POA: Diagnosis not present

## 2023-07-02 DIAGNOSIS — K089 Disorder of teeth and supporting structures, unspecified: Secondary | ICD-10-CM | POA: Diagnosis not present

## 2023-07-13 DIAGNOSIS — E1169 Type 2 diabetes mellitus with other specified complication: Secondary | ICD-10-CM | POA: Diagnosis not present

## 2023-07-17 NOTE — Addendum Note (Signed)
 Addended by: TAWNI DRILLING D on: 07/17/2023 05:23 PM   Modules accepted: Orders

## 2023-07-17 NOTE — Progress Notes (Signed)
 Remote ICD transmission.

## 2023-07-20 ENCOUNTER — Telehealth: Payer: Self-pay

## 2023-07-20 NOTE — Telephone Encounter (Signed)
 1st attempt to reach pt regarding surgical clearance and the need for an TELE appointment.  Left pt a detailed message to call back and get that scheduled.

## 2023-07-20 NOTE — Telephone Encounter (Signed)
   Pre-operative Risk Assessment    Patient Name: Tommy Liu  DOB: 02/21/1965 MRN: [id number]   Date of last office visit: 05/26/22 Date of next office visit: Not scheduled  Request for Surgical Clearance    Procedure:  Dental Extraction - Amount of Teeth to be Pulled:  x13 teeth  Date of Surgery:  Clearance TBD                                Surgeon:  Not provided Surgeon's Group or Practice Name:  [facility] Phone number:  [phone] Fax number:  [phone]   Type of Clearance Requested:   - Medical    Type of Anesthesia:  Not Indicated   Additional requests/questions:  On Plavix , med hold not requested  [name]   07/20/2023, 11:28 AM

## 2023-07-20 NOTE — Telephone Encounter (Signed)
   Name: Tommy Liu  DOB: Feb 12, 1965  MRN: [id number]  Primary Cardiologist: Aleene Passe, MD   Preoperative team, please contact this patient and set up a phone call appointment for further preoperative risk assessment. Last office visit 04/11/2023. Please obtain consent and complete medication review. Thank you for your help.  Patient on Plavix  but hold not requested per clearance form.   I also confirmed the patient resides in the state of [place] . As per [facility] Medical Board telemedicine laws, the patient must reside in the state in which the provider is licensed.   Artist Pouch, PA-C 07/20/2023, 11:39 AM  [facility]

## 2023-07-22 IMAGING — DX DG CHEST 2V
2 series · 2 of 2 positions shown · non-contrast
Comparison: 09/19/2020

CLINICAL DATA: AICD placement

EXAM:
CHEST - 2 VIEW

[w chest pa]
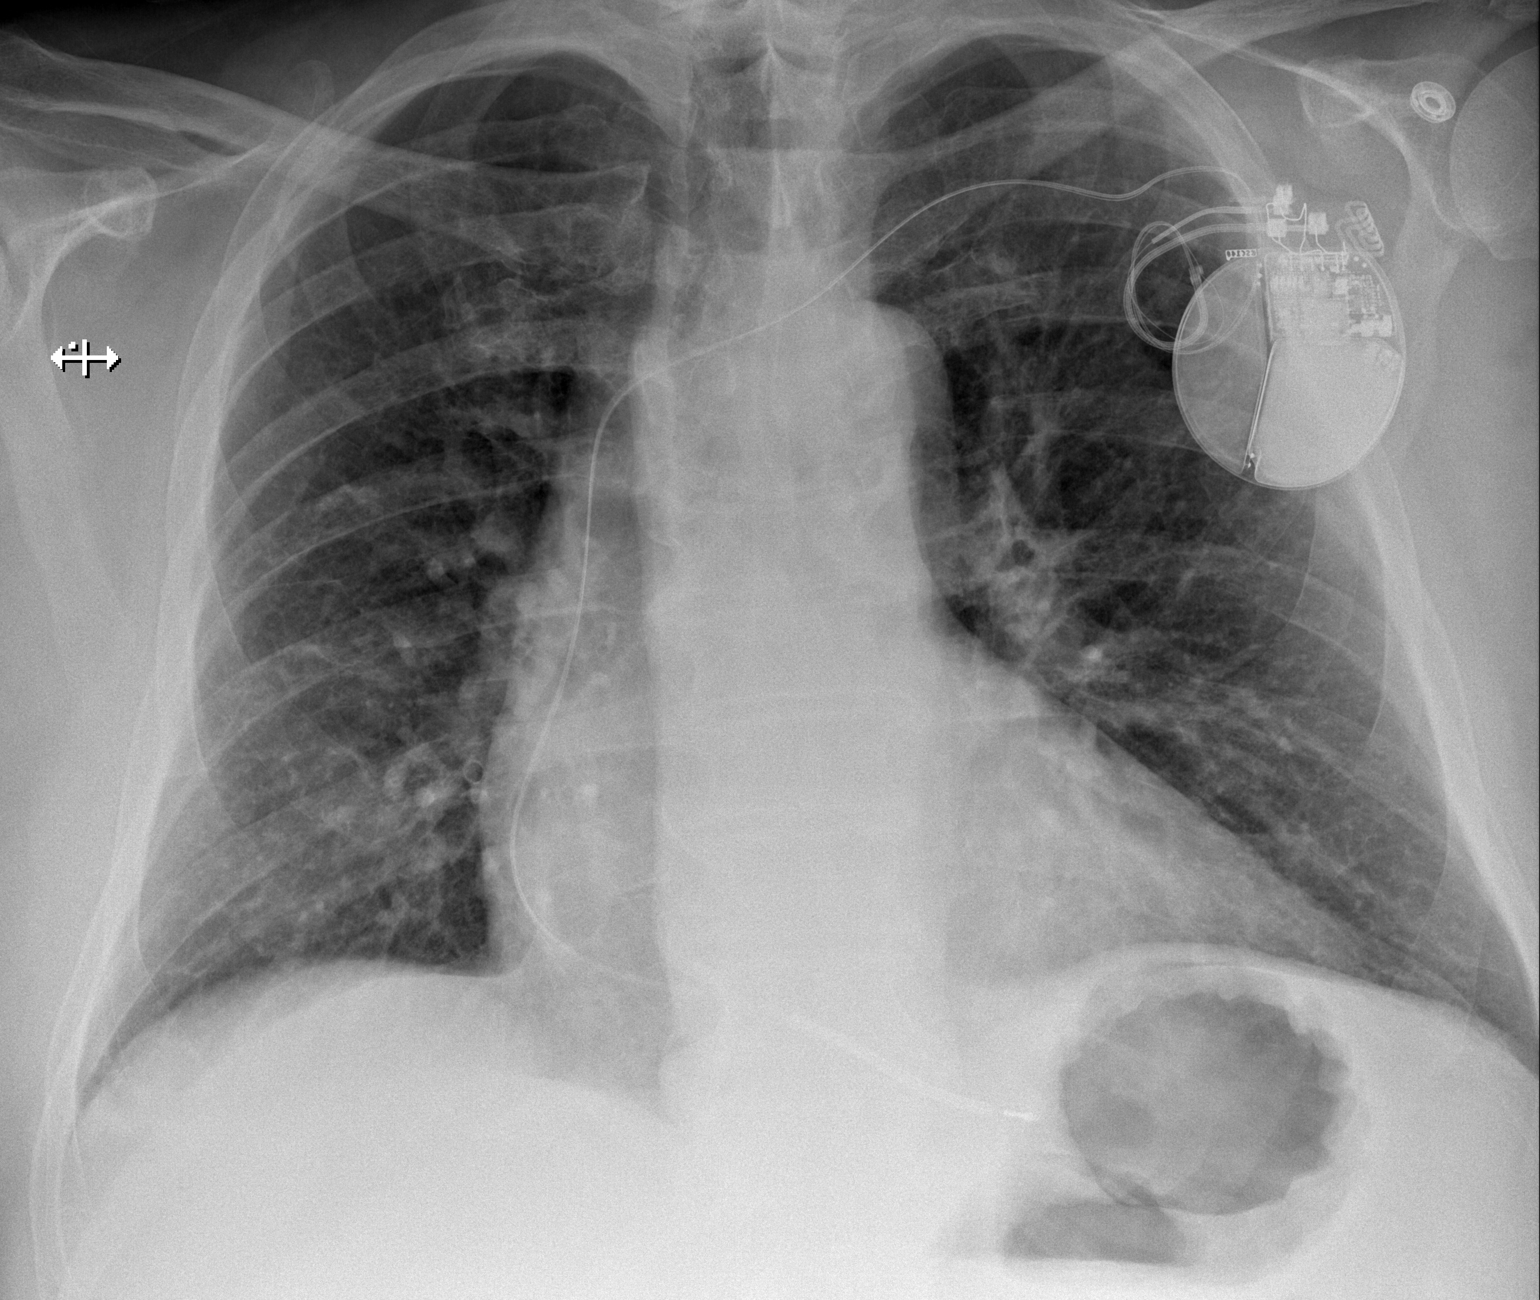

[w chest lat]
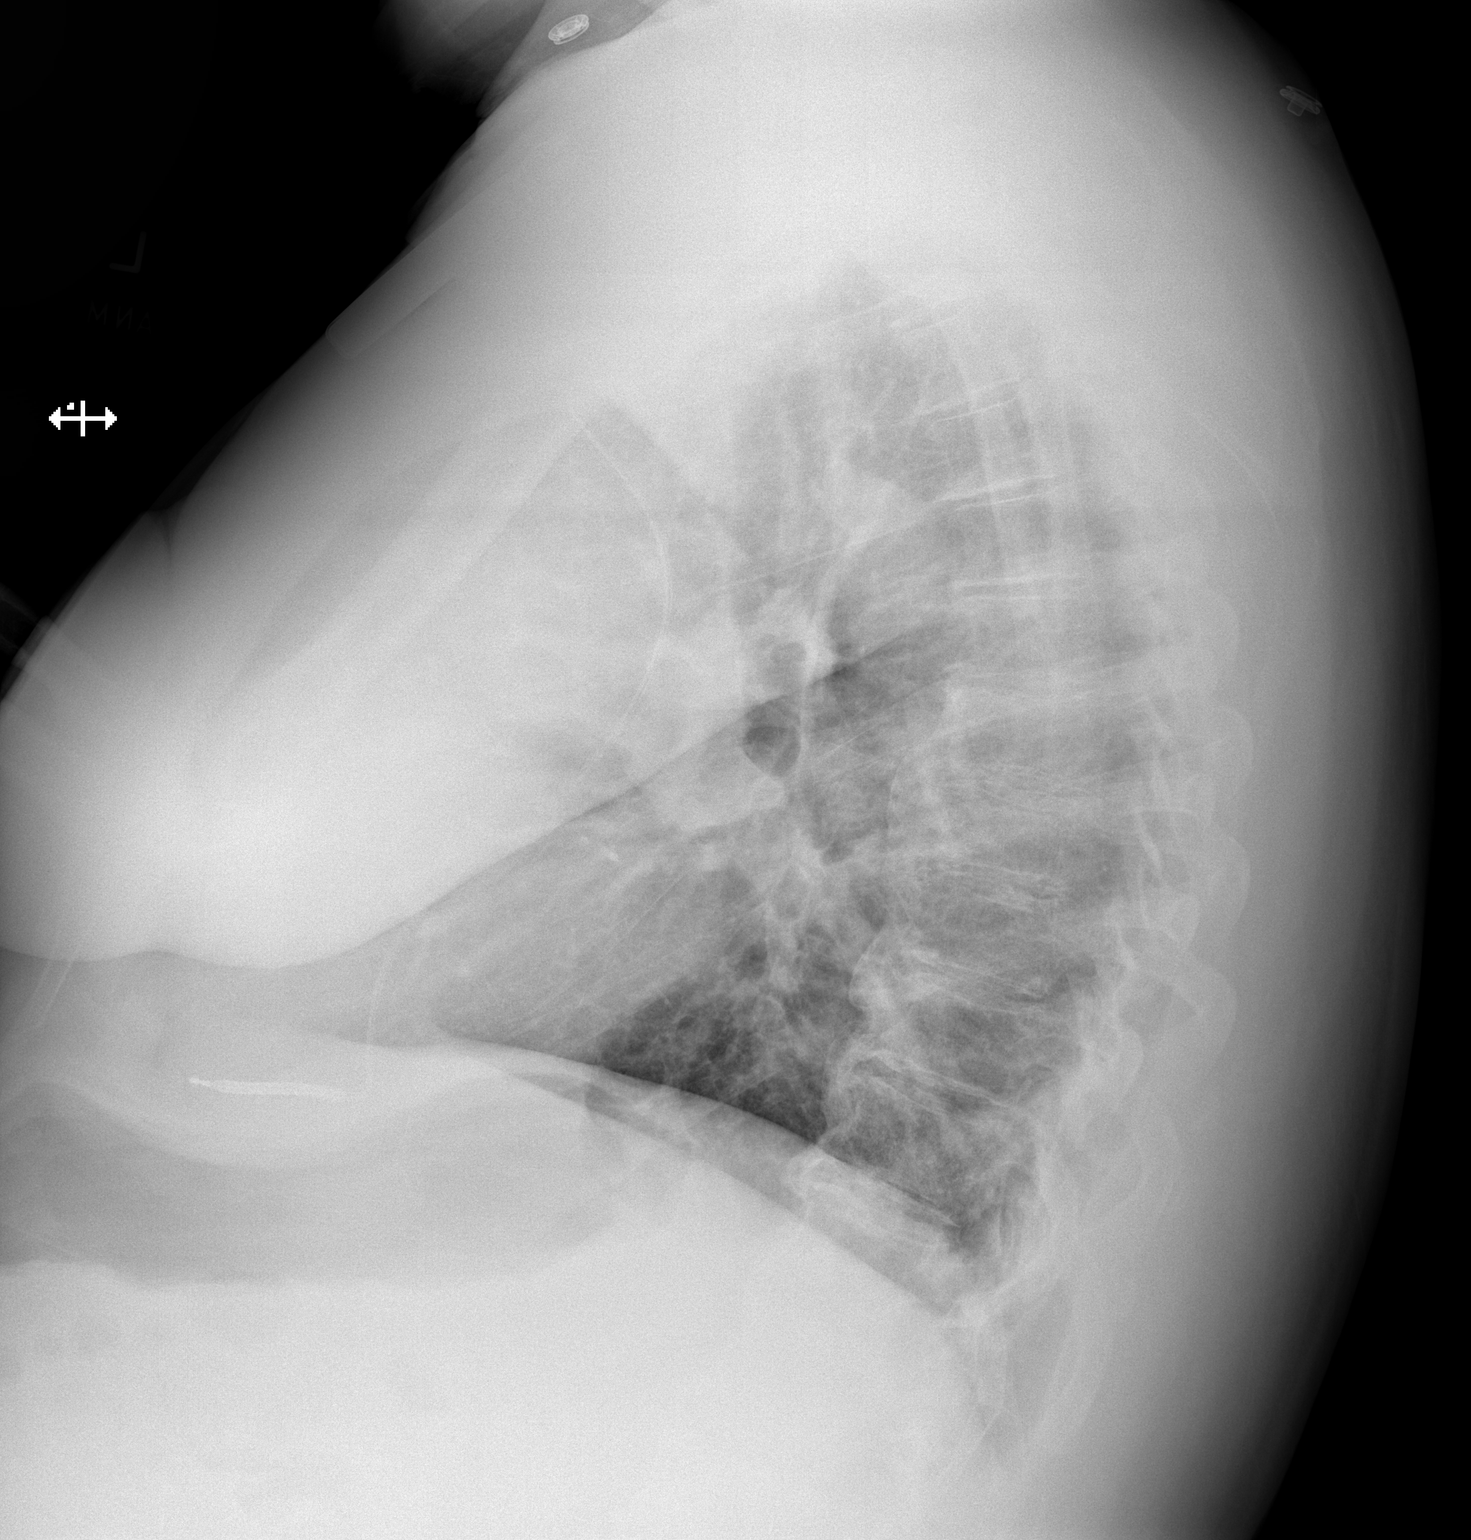

[2 of 2 positions shown; findings below may reference images not displayed]

FINDINGS: The lungs are clear without focal pneumonia, edema, pneumothorax or
pleural effusion. Single lead pacer/AICD noted. Cardiopericardial
silhouette is at upper limits of normal for size. The visualized
bony structures of the thorax show no acute abnormality.
IMPRESSION: No active cardiopulmonary disease.

## 2023-07-23 NOTE — Telephone Encounter (Signed)
 2nd attempt to schedule pt for TELE Preop appt. Left message for pt to call our office and ask for the preop team to schedule tele preop appt

## 2023-07-24 NOTE — Telephone Encounter (Signed)
 Our office has attempted x 3 to reach the pt to schedule tele preop appt, though have been unable to reach the pt.   We will remove from the preop call back pool until the pt calls back to schedule tele preop appt.   I will update the surgeon office.

## 2023-08-22 ENCOUNTER — Encounter: Payer: Self-pay | Admitting: Emergency Medicine

## 2023-08-23 ENCOUNTER — Ambulatory Visit (HOSPITAL_COMMUNITY): Payer: Self-pay | Admitting: Physician Assistant

## 2023-08-23 ENCOUNTER — Ambulatory Visit (HOSPITAL_COMMUNITY)
Admission: RE | Admit: 2023-08-23 | Discharge: 2023-08-23 | Disposition: A | Discharge status: Home / Self Care | Source: Ambulatory Visit | Attending: Physician Assistant | Admitting: Physician Assistant

## 2023-08-23 ENCOUNTER — Ambulatory Visit (HOSPITAL_COMMUNITY)
Admission: RE | Admit: 2023-08-23 | Discharge: 2023-08-23 | Disposition: A | Discharge status: Home / Self Care | Source: Ambulatory Visit | Attending: Physician Assistant

## 2023-08-23 ENCOUNTER — Encounter (HOSPITAL_COMMUNITY): Payer: Self-pay

## 2023-08-23 VITALS — BP 122/78 | HR 60 | Ht 74.0 in | Wt 266.8 lb

## 2023-08-23 DIAGNOSIS — I77810 Thoracic aortic ectasia: Secondary | ICD-10-CM | POA: Diagnosis not present

## 2023-08-23 DIAGNOSIS — Z87891 Personal history of nicotine dependence: Secondary | ICD-10-CM | POA: Diagnosis not present

## 2023-08-23 DIAGNOSIS — I255 Ischemic cardiomyopathy: Secondary | ICD-10-CM | POA: Insufficient documentation

## 2023-08-23 DIAGNOSIS — I2510 Atherosclerotic heart disease of native coronary artery without angina pectoris: Secondary | ICD-10-CM

## 2023-08-23 DIAGNOSIS — E119 Type 2 diabetes mellitus without complications: Secondary | ICD-10-CM | POA: Diagnosis not present

## 2023-08-23 DIAGNOSIS — Z951 Presence of aortocoronary bypass graft: Secondary | ICD-10-CM | POA: Diagnosis not present

## 2023-08-23 DIAGNOSIS — I252 Old myocardial infarction: Secondary | ICD-10-CM | POA: Insufficient documentation

## 2023-08-23 DIAGNOSIS — Z7902 Long term (current) use of antithrombotics/antiplatelets: Secondary | ICD-10-CM | POA: Diagnosis not present

## 2023-08-23 DIAGNOSIS — I5022 Chronic systolic (congestive) heart failure: Secondary | ICD-10-CM

## 2023-08-23 DIAGNOSIS — E785 Hyperlipidemia, unspecified: Secondary | ICD-10-CM

## 2023-08-23 DIAGNOSIS — I110 Hypertensive heart disease with heart failure: Secondary | ICD-10-CM | POA: Insufficient documentation

## 2023-08-23 DIAGNOSIS — G4733 Obstructive sleep apnea (adult) (pediatric): Secondary | ICD-10-CM | POA: Insufficient documentation

## 2023-08-23 DIAGNOSIS — Z7984 Long term (current) use of oral hypoglycemic drugs: Secondary | ICD-10-CM | POA: Diagnosis not present

## 2023-08-23 LAB — BASIC METABOLIC PANEL WITH GFR
Anion gap: 10 (ref 5–15)
BUN: 5 mg/dL — ABNORMAL LOW (ref 6–20)
CO2: 26 mmol/L (ref 22–32)
Calcium: 9.1 mg/dL (ref 8.9–10.3)
Chloride: 102 mmol/L (ref 98–111)
Creatinine, Ser: 0.56 mg/dL — ABNORMAL LOW (ref 0.61–1.24)
GFR, Estimated: >60 mL/min (ref >60)
Glucose, Bld: 272 mg/dL — ABNORMAL HIGH (ref 70–99)
Potassium: 4.2 mmol/L (ref 3.5–5.1)
Sodium: 138 mmol/L (ref 135–145)

## 2023-08-23 LAB — BRAIN NATRIURETIC PEPTIDE: B Natriuretic Peptide: 142 pg/mL — ABNORMAL HIGH (ref 0.0–100.0)

## 2023-08-23 LAB — ECHOCARDIOGRAM COMPLETE
Area-P 1/2: 2.83 cm2
S' Lateral: 4 cm

## 2023-08-23 MED ORDER — PERFLUTREN LIPID MICROSPHERE
1.0000 mL | INTRAVENOUS | Status: DC | PRN
  Administered 2023-08-23: 4 mL via INTRAVENOUS

## 2023-08-23 MED ORDER — SPIRONOLACTONE 25 MG PO TABS: 25.0000 mg | ORAL_TABLET | Freq: Every day | ORAL | 3 refills | Status: AC

## 2023-08-23 MED ORDER — CLOPIDOGREL BISULFATE 75 MG PO TABS: 75.0000 mg | ORAL_TABLET | Freq: Every day | ORAL | 3 refills | Status: AC

## 2023-08-23 MED ORDER — POTASSIUM CHLORIDE CRYS ER 20 MEQ PO TBCR: 20.0000 meq | EXTENDED_RELEASE_TABLET | Freq: Two times a day (BID) | ORAL | 3 refills | Status: AC

## 2023-08-23 NOTE — Progress Notes (Signed)
  Echocardiogram 2D Echocardiogram has been performed.  Tommy Liu, RDCS 08/23/2023, 10:43 AM

## 2023-08-23 NOTE — Addendum Note (Signed)
 Encounter addended by: Colletta Manuelita Garre, PA-C on: 08/23/2023 12:20 PM  Actions taken: Clinical Note Signed

## 2023-08-23 NOTE — Progress Notes (Addendum)
 Advanced Heart Failure Clinic Note    PCP: Bobbie Vicenta BROCKS, MD HF Cardiologist: Dr. Rolan EP: Dr. Inocencio Hazy Tommy Liu is a 58 y.o. with history of CAD, chronic systolic CHF, HTN, type 2 diabetes, severe OSA not compliant with CPAP.    Patient was admitted in 5/19 with unstable angina.  Cath showed 90% proximal stenosis of nondominant RCA and 40% stenosis proximal LAD.  Echo showed EF 60-65%.  He was managed medically.  He was lost to followup as an outpatient until 9/22.    Transferred from [place] to [facility] on 09/19/20 with anterior STEMI and cardiogenic shock. He was hypotensive on arrival and started on norepinephrine . Coronary angiogram demonstrated 90% thrombotic stenosis in mid LAD treated with PCI/DES, suspected embolic disease causing occlusion in distal LAD, chronic OM1 occlusion with collaterals, 90% proximal non-dominant RCA. RHC showed mean RA 9, PA 26/11, mean PCWP 16 with LVEDP 23, CI 2.46. Echo 9/22 with LVEF of 25-30%, WMA consistent with large LAD territory infarct, no LV thrombus, RV okay, RVSP 30 mmHg, no MR.  Diuresed with IV lasix . Initiated on GDMT with Entresto , empagliflozin , and spironolactone . BP too soft for beta blocker. LifeVest placed prior to discharge; discharge weight 220 lbs.    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   S/p [employer] ICD implant 2/23.  Echo 8/24 EF 35-40% with wall motion abnormalities, mild LVH, grade 2 diastolic dysfunction, normal RV, IVC normal.   He is here today for CHF follow-up. Doing well from HF standpoint. Does not exercise routinely but is able to perform ADLs, household chores and walk his dog without limitation. No shortness of breath, orthopnea or PND. He's had chronic left leg edema off and on but this has been better recently. Only complaint is chronic fatigue.  Reports using CPAP on occasion. He has been off plavix , KCL, and spironolactone  for at least a few weeks, he  ran out of them.   PMH: 1. HTN 2. Type 2 diabetes 3. Severe OSA: AHI 96.  - Not adherent with CPAP 4. CAD: Unstable angina in 5/19, cath with 90% proximal nondominant small RCA.  - 9/22 anterior STEMI: 90% thrombotic mid LAD stenosis treated with DES, total occlusion of distal LAD likely by embolic event, totally occluded OM1 with collaterals, 90% proximal stenosis nondominant RCA.  5. Chronic systolic CHF: Ischemic cardiomyopathy.  [employer] ICD.  - Echo (9/22): EF 25-30%, LAD territory WMAs, RV ok.  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  - Echo (8/24): EF 35-40% with wall motion abnormalities, mild LVH, grade 2 diastolic dysfunction, normal RV, IVC normal.  Current Outpatient Medications  Medication Sig Dispense Refill   carvedilol  (COREG ) 25 MG tablet Take 1 tablet (25 mg total) by mouth 2 (two) times daily. 180 tablet 3   empagliflozin  (JARDIANCE ) 25 MG TABS tablet Take 12.5 mg (1/2 tablet) daily 45 tablet 3   ezetimibe  (ZETIA ) 10 MG tablet Take 1 tablet (10 mg total) by mouth daily. 90 tablet 3   furosemide  (LASIX ) 20 MG tablet Take 2 tablets (40 mg total) by mouth daily. 180 tablet 3   metFORMIN (GLUCOPHAGE) 500 MG tablet Take 1,000 mg by mouth 2 (two) times daily with a meal.      nitroGLYCERIN  (NITROSTAT ) 0.4 MG SL tablet Place 1 tablet (0.4 mg total) under the tongue every 5 (five) minutes x 3 doses as needed for chest pain. 25 tablet 0  rosuvastatin  (CRESTOR ) 40 MG tablet Take 1 tablet (40 mg total) by mouth daily. 90 tablet 3   sacubitril -valsartan  (ENTRESTO ) 97-103 MG Take 1 tablet by mouth 2 (two) times daily. 180 tablet 3   TRULICITY 0.75 MG/0.5ML SOAJ Inject 0.75 mg into the skin once a week.     acetaminophen  (TYLENOL ) 325 MG tablet Take 650 mg by mouth every 6 (six) hours as needed for moderate pain. (Patient not taking: Reported on 08/23/2023)     clopidogrel  (PLAVIX ) 75 MG tablet Take 1 tablet (75 mg total) by mouth  daily. 90 tablet 3   potassium chloride  SA (KLOR-CON  M) 20 MEQ tablet Take 1 tablet (20 mEq total) by mouth 2 (two) times daily. 180 tablet 3   spironolactone  (ALDACTONE ) 25 MG tablet Take 1 tablet (25 mg total) by mouth at bedtime. 90 tablet 3   tamsulosin (FLOMAX) 0.4 MG CAPS capsule Take 0.4 mg by mouth daily. (Patient not taking: Reported on 08/23/2023)     No current facility-administered medications for this encounter.   Facility-Administered Medications Ordered in Other Encounters  Medication Dose Route Frequency Provider Last Rate Last Admin   perflutren  lipid microspheres (DEFINITY ) IV suspension  1-10 mL Intravenous PRN Glena Harlene HERO, FNP   4 mL at 08/23/23 1046   No Known Allergies  Social History   Socioeconomic History   Marital status: Divorced    Spouse name: Not on file   Number of children: Not on file   Years of education: Not on file   Highest education level: Not on file  Occupational History   Not on file  Tobacco Use   Smoking status: Former    Current packs/day: 0.00    Average packs/day: 1 pack/day for 30.0 years (30.0 ttl pk-yrs)    Types: Cigarettes    Start date: 01/03/1967    Quit date: 01/02/1997    Years since quitting: 26.6   Smokeless tobacco: Former  Building services engineer status: Never Used  Substance and Sexual Activity   Alcohol use: Yes    Alcohol/week: 2.0 standard drinks of alcohol    Types: 2 Cans of beer per week    Comment: occasional   Drug use: Never   Sexual activity: Not on file  Other Topics Concern   Not on file  Social History Narrative   Lives alone.  Still has support of ex-wife and other relatives   Social Drivers of Health   Financial Resource Strain: High Risk (05/20/2021)   Overall Financial Resource Strain (CARDIA)    Difficulty of Paying Living Expenses: Very hard  Food Insecurity: Food Insecurity Present (05/20/2021)   Hunger Vital Sign    Worried About Running Out of Food in the Last Year: Sometimes true     Ran Out of Food in the Last Year: Sometimes true  Transportation Needs: No Transportation Needs (05/20/2021)   PRAPARE - Administrator, Civil Service (Medical): No    Lack of Transportation (Non-Medical): No  Physical Activity: Not on file  Stress: Not on file  Social Connections: Not on file  Intimate Partner Violence: Not on file   Family History  Problem Relation Age of Onset   Heart attack Mother    Heart attack Father    Hypertension Father    BP 122/78   Pulse 60   Ht 6' 2 (1.88 m)   Wt 121 kg (266 lb 12.8 oz)   SpO2 98%   BMI 34.26 kg/m   Wt  Readings from Last 3 Encounters:  08/23/23 121 kg (266 lb 12.8 oz)  04/11/23 127.3 kg (280 lb 9.6 oz)  12/11/22 128.8 kg (284 lb)   PHYSICAL EXAM: General:  Well appearing. Ambulated into clinic. Cor: No JVD. Regular rate & rhythm. No murmurs. Lungs: clear Abdomen: obese, soft, nontender, nondistended.  Extremities: chronic venous stasis changes to bilateral lower extremities, 1+ peripheral edema Neuro: alert & orientedx3. Affect pleasant  [employer] ICD interrogation: HL Index 0, no VT/VF   ASSESSMENT & PLAN: 1. CAD:  Very strong FH of CAD. History of unstable angina 5/19, medically managed (90% stenosis nondominant RCA).  Admitted with acute anterior MI in 9/22. Cath with 90% thrombotic stenosis mid LAD, suspected embolic occlusion distal LAD, CTO OM1 with collaterals, 90% nondominant RCA.  Patient treated with DES to mid LAD.  No chest pain.  - Restart Plavix  75 mg daily, refilled - Continue Crestor  40 mg daily + Zetia .  Last LDL was 130. Reports med compliance. He was referred to Lipid Clinic last visit for PCSK-9 Inhibitor but states he did not get an appointment. Will resubmit referral. 2. Chronic systolic CHF: Ischemic cardiomyopathy.  Echo in 9/22 showed EF 25-30%, LAD wall motion abnormalities, no LV thrombus, normal RV. Echo in 12/22 showed EF 30% with apical aneurysm but no thrombus.  [employer]  ICD.  Echo 8/24 showed EF 35-40% with wall motion abnormalities, mild LVH, grade 2 diastolic dysfunction, normal RV, IVC normal.  Echo today pending.  - NYHA II. Suspect untreated OSA is contributing to his fatigue.  - Volume okay on exam and by HL index on his device (think peripheral edema is primarily venous insufficiency).   - Continue lasix  40 mg daily. Refilled his potassium chloride . - Continue Coreg  25 mg bid - Continue Entresto  97/103 mg bid.  - Continue empagliflozin  12.5 mg daily. No GU symptoms. - Restart Spironolactone  25 mg daily, refilled - Labs today 3. Diabetes: Followed at [facility]. He is on Trulicity 4. Severe OSA: AHI 96 - Needs better adherence with CPAP - Followed by [facility] - needs to call for follow-up  Follow up 6 months with Dr. Rolan  [facility], MANUELITA SAILOR, PA-C 08/23/23

## 2023-08-23 NOTE — Patient Instructions (Signed)
 There has been no changes to your medications.  Labs done today, your results will be available in MyChart, we will contact you for abnormal readings.  You have been referred to the lipid clinic. They will call you to arrange your appointment.  Please called [facility] to arrange a follow up appointment.  Your physician recommends that you schedule a follow-up appointment in: 6 months ( February 2026) ** PLEASE CALL THE OFFICE IN NOVEMBER TO ARRANGE YOUR FOLLOW UP APPOINTMENT.**  If you have any questions or concerns before your next appointment please send us  a message through [facility] or call our office at [phone].    TO LEAVE A MESSAGE FOR THE NURSE SELECT OPTION 2, PLEASE LEAVE A MESSAGE INCLUDING: YOUR NAME DATE OF BIRTH CALL BACK NUMBER REASON FOR CALL**this is important as we prioritize the call backs  YOU WILL RECEIVE A CALL BACK THE SAME DAY AS LONG AS YOU CALL BEFORE 4:00 PM  At the [facility], you and your health needs are our priority. As part of our continuing mission to provide you with exceptional heart care, we have created designated Provider Care Teams. These Care Teams include your primary Cardiologist (physician) and Advanced Practice Providers (APPs- Physician Assistants and Nurse Practitioners) who all work together to provide you with the care you need, when you need it.   You may see any of the following providers on your designated Care Team at your next follow up: Dr Toribio Fuel Dr Ezra Shuck Dr. Ria Commander Dr. Morene Brownie Amy Lenetta, NP Caffie Shed, [place] [facility] S[place], [place] Beckey Coe, NP [place] Lee, NP Ellouise Class, NP Tinnie Redman, PharmD Jaun Bash, PharmD   Please be sure to bring in all your medications bottles to every appointment.    Thank you for choosing [facility]

## 2023-08-24 ENCOUNTER — Ambulatory Visit (HOSPITAL_COMMUNITY): Payer: Self-pay | Admitting: Family Medicine

## 2023-08-28 ENCOUNTER — Ambulatory Visit (INDEPENDENT_AMBULATORY_CARE_PROVIDER_SITE_OTHER): Payer: No Typology Code available for payment source

## 2023-08-28 DIAGNOSIS — I5022 Chronic systolic (congestive) heart failure: Secondary | ICD-10-CM

## 2023-08-29 ENCOUNTER — Other Ambulatory Visit: Payer: Self-pay

## 2023-08-29 DIAGNOSIS — I5022 Chronic systolic (congestive) heart failure: Secondary | ICD-10-CM

## 2023-08-29 MED ORDER — FUROSEMIDE 20 MG PO TABS: 40.0000 mg | ORAL_TABLET | Freq: Every day | ORAL | 3 refills | Status: AC

## 2023-08-29 NOTE — Telephone Encounter (Signed)
 This is a CHF pt

## 2023-08-31 LAB — CUP PACEART REMOTE DEVICE CHECK
Battery Remaining Longevity: 150 mo
Battery Remaining Percentage: 100 %
Brady Statistic RV Percent Paced: 0 %
Date Time Interrogation Session: 20250829014100
HighPow Impedance: 94 Ohm
Implantable Lead Connection Status: 753985
Implantable Lead Implant Date: 20230215
Implantable Lead Location: 753860
Implantable Lead Model: 138
Implantable Lead Serial Number: 302859
Implantable Pulse Generator Implant Date: 20230215
Lead Channel Impedance Value: 507 Ohm
Lead Channel Pacing Threshold Amplitude: 0.8 V
Lead Channel Pacing Threshold Pulse Width: 0.4 ms
Lead Channel Setting Pacing Amplitude: 2.5 V
Lead Channel Setting Pacing Pulse Width: 0.4 ms
Lead Channel Setting Sensing Sensitivity: 0.5 mV
Pulse Gen Serial Number: 215773

## 2023-09-04 ENCOUNTER — Encounter: Payer: Self-pay | Admitting: Internal Medicine

## 2023-09-06 ENCOUNTER — Ambulatory Visit: Payer: Self-pay | Admitting: Cardiology

## 2023-09-19 ENCOUNTER — Other Ambulatory Visit (HOSPITAL_COMMUNITY): Payer: Self-pay | Admitting: Family Medicine

## 2023-09-21 DIAGNOSIS — Z6835 Body mass index (BMI) 35.0-35.9, adult: Secondary | ICD-10-CM | POA: Diagnosis not present

## 2023-09-21 DIAGNOSIS — Z125 Encounter for screening for malignant neoplasm of prostate: Secondary | ICD-10-CM | POA: Diagnosis not present

## 2023-09-21 DIAGNOSIS — E1165 Type 2 diabetes mellitus with hyperglycemia: Secondary | ICD-10-CM | POA: Diagnosis not present

## 2023-10-29 DIAGNOSIS — E1165 Type 2 diabetes mellitus with hyperglycemia: Secondary | ICD-10-CM | POA: Diagnosis not present

## 2023-10-29 DIAGNOSIS — E785 Hyperlipidemia, unspecified: Secondary | ICD-10-CM | POA: Diagnosis not present

## 2023-11-02 DIAGNOSIS — Z794 Long term (current) use of insulin: Secondary | ICD-10-CM | POA: Diagnosis not present

## 2023-11-02 DIAGNOSIS — Z23 Encounter for immunization: Secondary | ICD-10-CM | POA: Diagnosis not present

## 2023-11-02 DIAGNOSIS — E1165 Type 2 diabetes mellitus with hyperglycemia: Secondary | ICD-10-CM | POA: Diagnosis not present

## 2023-11-02 DIAGNOSIS — Z1331 Encounter for screening for depression: Secondary | ICD-10-CM | POA: Diagnosis not present

## 2023-11-02 DIAGNOSIS — Z6834 Body mass index (BMI) 34.0-34.9, adult: Secondary | ICD-10-CM | POA: Diagnosis not present

## 2023-11-12 DIAGNOSIS — I509 Heart failure, unspecified: Secondary | ICD-10-CM | POA: Diagnosis not present

## 2023-11-12 DIAGNOSIS — E1169 Type 2 diabetes mellitus with other specified complication: Secondary | ICD-10-CM | POA: Diagnosis not present

## 2023-11-12 DIAGNOSIS — I10 Essential (primary) hypertension: Secondary | ICD-10-CM | POA: Diagnosis not present

## 2023-11-27 ENCOUNTER — Ambulatory Visit: Payer: No Typology Code available for payment source

## 2023-11-27 DIAGNOSIS — I5022 Chronic systolic (congestive) heart failure: Secondary | ICD-10-CM

## 2023-11-30 LAB — CUP PACEART REMOTE DEVICE CHECK
Battery Remaining Longevity: 150 mo
Battery Remaining Percentage: 100 %
Brady Statistic RV Percent Paced: 0 %
Date Time Interrogation Session: 20251126014100
HighPow Impedance: 76 Ohm
Implantable Lead Connection Status: 753985
Implantable Lead Implant Date: 20230215
Implantable Lead Location: 753860
Implantable Lead Model: 138
Implantable Lead Serial Number: 302859
Implantable Pulse Generator Implant Date: 20230215
Lead Channel Impedance Value: 465 Ohm
Lead Channel Pacing Threshold Amplitude: 0.8 V
Lead Channel Pacing Threshold Pulse Width: 0.4 ms
Lead Channel Setting Pacing Amplitude: 2.5 V
Lead Channel Setting Pacing Pulse Width: 0.4 ms
Lead Channel Setting Sensing Sensitivity: 0.5 mV
Pulse Gen Serial Number: 215773

## 2023-12-03 DIAGNOSIS — I509 Heart failure, unspecified: Secondary | ICD-10-CM | POA: Diagnosis not present

## 2023-12-03 DIAGNOSIS — E1169 Type 2 diabetes mellitus with other specified complication: Secondary | ICD-10-CM | POA: Diagnosis not present

## 2023-12-03 DIAGNOSIS — I10 Essential (primary) hypertension: Secondary | ICD-10-CM | POA: Diagnosis not present

## 2023-12-03 NOTE — Progress Notes (Signed)
 Remote ICD Transmission

## 2023-12-06 ENCOUNTER — Ambulatory Visit: Payer: Self-pay | Admitting: Cardiology

## 2024-02-15 ENCOUNTER — Ambulatory Visit (HOSPITAL_COMMUNITY): Admitting: Cardiology

## 2024-02-26 ENCOUNTER — Ambulatory Visit: Payer: No Typology Code available for payment source
